# Patient Record
Sex: Male | Born: 1965
Health system: Southern US, Community
[De-identification: ages and names within clinical notes are randomized; demographics above are authoritative.]

## PROBLEM LIST (undated history)

## (undated) DIAGNOSIS — B958 Unspecified staphylococcus as the cause of diseases classified elsewhere: Secondary | ICD-10-CM

## (undated) DIAGNOSIS — J45909 Unspecified asthma, uncomplicated: Secondary | ICD-10-CM

## (undated) DIAGNOSIS — R7303 Prediabetes: Secondary | ICD-10-CM

## (undated) DIAGNOSIS — F909 Attention-deficit hyperactivity disorder, unspecified type: Secondary | ICD-10-CM

## (undated) DIAGNOSIS — E079 Disorder of thyroid, unspecified: Secondary | ICD-10-CM

## (undated) DIAGNOSIS — M1990 Unspecified osteoarthritis, unspecified site: Secondary | ICD-10-CM

## (undated) DIAGNOSIS — K219 Gastro-esophageal reflux disease without esophagitis: Secondary | ICD-10-CM

## (undated) DIAGNOSIS — R7989 Other specified abnormal findings of blood chemistry: Secondary | ICD-10-CM

## (undated) DIAGNOSIS — S62109A Fracture of unspecified carpal bone, unspecified wrist, initial encounter for closed fracture: Secondary | ICD-10-CM

## (undated) DIAGNOSIS — E785 Hyperlipidemia, unspecified: Secondary | ICD-10-CM

## (undated) DIAGNOSIS — R112 Nausea with vomiting, unspecified: Secondary | ICD-10-CM

## (undated) DIAGNOSIS — K37 Unspecified appendicitis: Secondary | ICD-10-CM

## (undated) DIAGNOSIS — G4730 Sleep apnea, unspecified: Secondary | ICD-10-CM

## (undated) DIAGNOSIS — S02401A Maxillary fracture, unspecified, initial encounter for closed fracture: Secondary | ICD-10-CM

## (undated) DIAGNOSIS — Z9889 Other specified postprocedural states: Secondary | ICD-10-CM

## (undated) DIAGNOSIS — T7840XA Allergy, unspecified, initial encounter: Secondary | ICD-10-CM

## (undated) HISTORY — DX: Maxillary fracture, unspecified side, initial encounter for closed fracture: S02.401A

## (undated) HISTORY — DX: Unspecified asthma, uncomplicated: J45.909

## (undated) HISTORY — DX: Allergy, unspecified, initial encounter: T78.40XA

## (undated) HISTORY — PX: APPENDECTOMY: SHX54

## (undated) HISTORY — DX: Sleep apnea, unspecified: G47.30

## (undated) HISTORY — DX: Unspecified osteoarthritis, unspecified site: M19.90

## (undated) HISTORY — DX: Other specified abnormal findings of blood chemistry: R79.89

## (undated) HISTORY — DX: Hyperlipidemia, unspecified: E78.5

## (undated) HISTORY — DX: Gastro-esophageal reflux disease without esophagitis: K21.9

## (undated) HISTORY — DX: Prediabetes: R73.03

## (undated) HISTORY — DX: Unspecified appendicitis: K37

## (undated) HISTORY — DX: Fracture of unspecified carpal bone, unspecified wrist, initial encounter for closed fracture: S62.109A

---
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
## 1996-02-24 DIAGNOSIS — K37 Unspecified appendicitis: Secondary | ICD-10-CM

## 1996-02-24 HISTORY — DX: Unspecified appendicitis: K37

## 2006-04-06 ENCOUNTER — Emergency Department (HOSPITAL_COMMUNITY): Admission: EM | Admit: 2006-04-06 | Discharge: 2006-04-06 | Payer: Self-pay | Admitting: Emergency Medicine

## 2007-02-24 DIAGNOSIS — S02401A Maxillary fracture, unspecified, initial encounter for closed fracture: Secondary | ICD-10-CM

## 2007-02-24 HISTORY — DX: Maxillary fracture, unspecified side, initial encounter for closed fracture: S02.401A

## 2010-10-29 DIAGNOSIS — E039 Hypothyroidism, unspecified: Secondary | ICD-10-CM | POA: Insufficient documentation

## 2010-12-04 ENCOUNTER — Ambulatory Visit (HOSPITAL_COMMUNITY): Payer: Self-pay | Admitting: Psychiatry

## 2010-12-10 DIAGNOSIS — G4733 Obstructive sleep apnea (adult) (pediatric): Secondary | ICD-10-CM | POA: Insufficient documentation

## 2011-02-20 DIAGNOSIS — E785 Hyperlipidemia, unspecified: Secondary | ICD-10-CM | POA: Insufficient documentation

## 2011-02-20 DIAGNOSIS — E559 Vitamin D deficiency, unspecified: Secondary | ICD-10-CM | POA: Insufficient documentation

## 2011-07-02 ENCOUNTER — Encounter (HOSPITAL_COMMUNITY): Payer: Self-pay | Admitting: Psychiatry

## 2011-07-02 ENCOUNTER — Ambulatory Visit (INDEPENDENT_AMBULATORY_CARE_PROVIDER_SITE_OTHER): Payer: BC Managed Care – PPO | Admitting: Psychiatry

## 2011-07-02 VITALS — BP 130/78 | HR 66 | Ht 68.0 in | Wt 220.0 lb

## 2011-07-02 DIAGNOSIS — F909 Attention-deficit hyperactivity disorder, unspecified type: Secondary | ICD-10-CM

## 2011-07-02 DIAGNOSIS — F988 Other specified behavioral and emotional disorders with onset usually occurring in childhood and adolescence: Secondary | ICD-10-CM

## 2011-07-02 MED ORDER — DEXMETHYLPHENIDATE HCL ER 15 MG PO CP24: 15.0000 mg | ORAL_CAPSULE | Freq: Every day | ORAL | Status: DC
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
## 2011-07-03 LAB — DRUGS OF ABUSE SCREEN W/O ALC, ROUTINE URINE
Barbiturate Quant, Ur: NEGATIVE
Marijuana Metabolite: NEGATIVE
Methadone: NEGATIVE
Opiate Screen, Urine: NEGATIVE
Propoxyphene: NEGATIVE

## 2011-07-08 ENCOUNTER — Telehealth (HOSPITAL_COMMUNITY): Payer: Self-pay

## 2011-07-08 NOTE — Telephone Encounter (Signed)
[employer] company for prior authorization for Focalin XR. Approval # [id number]. Called pharmacy and patient to informed the same.

## 2011-07-08 NOTE — Telephone Encounter (Signed)
Needs to talk to you about rx and authorization

## 2011-07-27 ENCOUNTER — Emergency Department (INDEPENDENT_AMBULATORY_CARE_PROVIDER_SITE_OTHER)
Admission: EM | Admit: 2011-07-27 | Discharge: 2011-07-27 | Disposition: A | Discharge status: Home / Self Care | Payer: 59 | Source: Home / Self Care | Attending: Family Medicine | Admitting: Family Medicine

## 2011-07-27 ENCOUNTER — Encounter: Payer: Self-pay | Admitting: Emergency Medicine

## 2011-07-27 DIAGNOSIS — J45990 Exercise induced bronchospasm: Secondary | ICD-10-CM

## 2011-07-27 DIAGNOSIS — J069 Acute upper respiratory infection, unspecified: Secondary | ICD-10-CM

## 2011-07-27 HISTORY — DX: Attention-deficit hyperactivity disorder, unspecified type: F90.9

## 2011-07-27 HISTORY — DX: Disorder of thyroid, unspecified: E07.9

## 2011-07-27 MED ORDER — AMOXICILLIN 875 MG PO TABS: 875.0000 mg | ORAL_TABLET | Freq: Two times a day (BID) | ORAL | Status: AC

## 2011-07-27 MED ORDER — BENZONATATE 200 MG PO CAPS: 200.0000 mg | ORAL_CAPSULE | Freq: Every day | ORAL | Status: AC

## 2011-07-27 MED ORDER — PREDNISONE 20 MG PO TABS: 20.0000 mg | ORAL_TABLET | Freq: Two times a day (BID) | ORAL | Status: DC

## 2011-07-27 NOTE — Discharge Instructions (Signed)
Take Mucinex D (guaifenesin with decongestant) twice daily for congestion.  Increase fluid intake, rest. May use Afrin nasal spray (or generic oxymetazoline) twice daily for about 5 days.  Also recommend using saline nasal spray several times daily and saline nasal irrigation (AYR is a common brand) Stop all antihistamines for now, and other non-prescription cough/cold preparations. Continue albuterol inhaler as needed. Follow-up with family doctor if not improving 7 to 10 days.

## 2011-07-27 NOTE — ED Notes (Signed)
Sinus congestion and hx of sinus infections 3 x each year.

## 2011-07-27 NOTE — ED Provider Notes (Signed)
History     CSN: [id number]  Arrival date & time 07/27/11  1956   None     Chief Complaint  Patient presents with  . Nasal Congestion     HPI Comments: Patient complains of 3 day history of gradually progressive URI symptoms beginning with a mild sore throat (now improved), followed by progressive nasal congestion.  A cough started concurrently. Complains of fatigue but no myalgias.  Cough is now worse at night and generally non-productive during the day.  There has been no pleuritic pain or shortness of breath, but he occasionally wheezes.   He has a past history of exercise induced asthma improved with an albuterol inhaler, and over the past three days he has been using his inhaler several times daily.  He has a past history of pneumonia about two years ago and has had numerous episodes of bronchitis.  He notes that he tends to get wheezing and bronchitis during viral URI's.   The history is provided by the patient.    Past Medical History  Diagnosis Date  . Fracture, maxillary 2009  . Appendicitis 1998  . Fracture of wrist 1984  . ADHD (attention deficit hyperactivity disorder)   . Thyroid disease     Past Surgical History  Procedure Date  . Appendectomy     Family History  Problem Relation Age of Onset  . Heart attack Father   . Emphysema Father   . Heart failure Father   . Alcohol abuse Maternal Aunt   . Alcohol abuse Maternal Uncle   . Diabetes Mother   . Hypertension Mother     History  Substance Use Topics  . Smoking status: Never Smoker   . Smokeless tobacco: Not on file  . Alcohol Use: No      Review of Systems + sore throat + cough No pleuritic pain ? wheezing + nasal congestion + post-nasal drainage No sinus pain/pressure No itchy/red eyes + earache No hemoptysis No SOB No fever, + chills/sweats No nausea No vomiting No abdominal pain No diarrhea No urinary symptoms No skin rashes + fatigue No myalgias + headache Used OTC meds  without relief (Daquil)  Allergies  Review of patient's allergies indicates no known allergies.  Home Medications   Current Outpatient Rx  Name Route Sig Dispense Refill  . AMOXICILLIN 875 MG PO TABS Oral Take 1 tablet (875 mg total) by mouth 2 (two) times daily. 20 tablet 0  . BENZONATATE 200 MG PO CAPS Oral Take 1 capsule (200 mg total) by mouth at bedtime. Take as needed for cough 12 capsule 0  . DEXMETHYLPHENIDATE HCL ER 15 MG PO CP24 Oral Take 1 capsule (15 mg total) by mouth daily. 30 capsule 0  . LEVOTHYROXINE SODIUM 112 MCG PO TABS Oral Take 112 mcg by mouth.    Marland Kitchen PREDNISONE 20 MG PO TABS Oral Take 1 tablet (20 mg total) by mouth 2 (two) times daily. 6 tablet 0  . PROAIR HFA 108 (90 BASE) MCG/ACT IN AERS      . SUMATRIPTAN SUCCINATE 100 MG PO TABS Oral Take 100 mg by mouth.      BP 115/80  Pulse 84  Resp 16  Ht 5\' 8"  (1.727 m)  Wt 215 lb (97.523 kg)  BMI 32.69 kg/m2  SpO2 98%  Physical Exam Nursing notes and Vital Signs reviewed. Appearance:  Patient appears healthy, stated age, and in no acute distress Eyes:  Pupils are equal, round, and reactive to light and accomodation.  Extraocular movement is intact.  Conjunctivae are not inflamed  Ears:  Canals normal.  Tympanic membranes normal.  Nose:  Mildly congested turbinates.  No sinus tenderness.  Pharynx:  Normal Neck:  Supple.   Tender shotty posterior nodes are palpated bilaterally  Lungs:   Faint bibasilar posterior wheezes.  Breath sounds are equal.  Chest:  Mild tenderness to palpation over the mid-sternum.  Heart:  Regular rate and rhythm without murmurs, rubs, or gallops.  Abdomen:  Nontender without masses or hepatosplenomegaly.  Bowel sounds are present.  No CVA or flank tenderness.  Extremities:  No edema.  No calf tenderness Skin:  No rash present.   ED Course  Procedures  none      1. Acute upper respiratory infections of unspecified site   2. Exercise-induced asthma       MDM  Begin  amoxicillin, prednisone burst.  Prescription written for Benzonatate (Tessalon) to take at bedtime for night-time cough.  Take Mucinex D (guaifenesin with decongestant) twice daily for congestion.  Increase fluid intake, rest. May use Afrin nasal spray (or generic oxymetazoline) twice daily for about 5 days.  Also recommend using saline nasal spray several times daily and saline nasal irrigation (AYR is a common brand) Stop all antihistamines for now, and other non-prescription cough/cold preparations. Continue albuterol inhaler as needed. Follow-up with family doctor if not improving 7 to 10 days.         Lattie Haw, MD 07/28/11 [phone]

## 2011-08-03 ENCOUNTER — Encounter (HOSPITAL_COMMUNITY): Payer: Self-pay | Admitting: Psychiatry

## 2011-08-03 ENCOUNTER — Ambulatory Visit (INDEPENDENT_AMBULATORY_CARE_PROVIDER_SITE_OTHER): Payer: BC Managed Care – PPO | Admitting: Psychiatry

## 2011-08-03 VITALS — BP 113/80 | HR 74 | Ht 68.0 in | Wt 213.0 lb

## 2011-08-03 DIAGNOSIS — F909 Attention-deficit hyperactivity disorder, unspecified type: Secondary | ICD-10-CM

## 2011-08-03 DIAGNOSIS — F988 Other specified behavioral and emotional disorders with onset usually occurring in childhood and adolescence: Secondary | ICD-10-CM

## 2011-08-03 MED ORDER — DEXMETHYLPHENIDATE HCL ER 15 MG PO CP24: 15.0000 mg | ORAL_CAPSULE | Freq: Every day | ORAL | Status: DC
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
## 2011-10-05 ENCOUNTER — Encounter (HOSPITAL_COMMUNITY): Payer: Self-pay | Admitting: Psychiatry

## 2011-10-05 ENCOUNTER — Ambulatory Visit (INDEPENDENT_AMBULATORY_CARE_PROVIDER_SITE_OTHER): Payer: Private Health Insurance - Indemnity | Admitting: Psychiatry

## 2011-10-05 VITALS — BP 112/76 | HR 81 | Ht 68.0 in | Wt 207.0 lb

## 2011-10-05 DIAGNOSIS — F988 Other specified behavioral and emotional disorders with onset usually occurring in childhood and adolescence: Secondary | ICD-10-CM

## 2011-10-05 DIAGNOSIS — F909 Attention-deficit hyperactivity disorder, unspecified type: Secondary | ICD-10-CM

## 2011-10-05 MED ORDER — DEXMETHYLPHENIDATE HCL ER 15 MG PO CP24: 15.0000 mg | ORAL_CAPSULE | Freq: Every day | ORAL | Status: DC
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
## 2011-12-07 ENCOUNTER — Encounter (HOSPITAL_COMMUNITY): Payer: Self-pay | Admitting: Psychiatry

## 2011-12-07 ENCOUNTER — Ambulatory Visit (INDEPENDENT_AMBULATORY_CARE_PROVIDER_SITE_OTHER): Payer: Private Health Insurance - Indemnity | Admitting: Psychiatry

## 2011-12-07 VITALS — BP 115/71 | HR 68 | Ht 68.0 in | Wt 209.0 lb

## 2011-12-07 DIAGNOSIS — F988 Other specified behavioral and emotional disorders with onset usually occurring in childhood and adolescence: Secondary | ICD-10-CM

## 2011-12-07 DIAGNOSIS — F909 Attention-deficit hyperactivity disorder, unspecified type: Secondary | ICD-10-CM

## 2011-12-07 MED ORDER — DEXMETHYLPHENIDATE HCL ER 15 MG PO CP24: 15.0000 mg | ORAL_CAPSULE | Freq: Every day | ORAL | Status: DC
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
## 2012-02-09 ENCOUNTER — Ambulatory Visit (HOSPITAL_COMMUNITY): Payer: Self-pay | Admitting: Psychiatry

## 2012-02-11 ENCOUNTER — Telehealth (HOSPITAL_COMMUNITY): Payer: Self-pay | Admitting: Psychiatry

## 2012-02-11 NOTE — Telephone Encounter (Signed)
Patient reports that he has a 30 day supply of Focalin XR, but has not had to use any as his is not currently working.   PLAN:  I advised the patient about the fact that we would close his chart and that he was welcome to come in to be reestablished as a patient when go his insurance back.

## 2015-04-09 LAB — TSH: TSH: 0.6 u[IU]/mL (ref 0.41–5.90)

## 2015-04-09 LAB — HEPATIC FUNCTION PANEL
ALK PHOS: 82 U/L (ref 25–125)
ALT: 48 U/L — AB (ref 10–40)
AST: 38 U/L (ref 14–40)
Bilirubin, Total: 0.9 mg/dL

## 2015-04-09 LAB — TESTOSTERONE
Testosterone, Free: 10.1
Testosterone: 454

## 2015-04-09 LAB — LIPID PANEL
Cholesterol: 195 mg/dL (ref 0–200)
HDL: 57 mg/dL (ref 35–70)
LDL CALC: 117 mg/dL
Triglycerides: 103 mg/dL (ref 40–160)

## 2015-04-09 LAB — BASIC METABOLIC PANEL
Creatinine: 1.3 mg/dL (ref 0.6–1.3)
Potassium: 5 mmol/L (ref 3.4–5.3)
Sodium: 140 mmol/L (ref 137–147)

## 2015-04-09 LAB — CBC AND DIFFERENTIAL
PLATELETS: 218 10*3/uL (ref 150–399)
WBC: 7.2 10*3/mL

## 2015-04-09 LAB — VITAMIN D 25 HYDROXY (VIT D DEFICIENCY, FRACTURES): VIT D 25 HYDROXY: 35.6

## 2015-04-25 LAB — C REACTIVE PROTEIN, FLUID: CRP: 2.57

## 2015-04-25 LAB — VITAMIN B12
COPPER: 104
VITAMIN B12: 475

## 2015-04-25 LAB — THYROXINE (T4) FREE, DIRECT: T4,Free (Direct): 1.58

## 2015-04-25 LAB — HEMOGLOBIN A1C: Hemoglobin A1C: 5.8

## 2015-06-19 ENCOUNTER — Encounter: Payer: Self-pay | Admitting: *Deleted

## 2015-06-19 ENCOUNTER — Emergency Department
Admission: EM | Admit: 2015-06-19 | Discharge: 2015-06-19 | Disposition: A | Discharge status: Home / Self Care | Payer: BLUE CROSS/BLUE SHIELD | Source: Home / Self Care | Attending: Family Medicine | Admitting: Family Medicine

## 2015-06-19 DIAGNOSIS — M7632 Iliotibial band syndrome, left leg: Secondary | ICD-10-CM | POA: Diagnosis not present

## 2015-06-19 DIAGNOSIS — M5432 Sciatica, left side: Secondary | ICD-10-CM

## 2015-06-19 MED ORDER — HYDROCODONE-ACETAMINOPHEN 5-325 MG PO TABS: 1.0000 | ORAL_TABLET | Freq: Four times a day (QID) | ORAL | Status: DC | PRN

## 2015-06-19 MED ORDER — MELOXICAM 7.5 MG PO TABS: 7.5000 mg | ORAL_TABLET | Freq: Every day | ORAL | Status: DC

## 2015-06-19 MED ORDER — PREDNISONE 20 MG PO TABS: ORAL_TABLET | ORAL | Status: DC

## 2015-06-19 NOTE — Discharge Instructions (Signed)
Norco/Vicodin (hydrocodone-acetaminophen) is a narcotic pain medication, do not combine these medications with others containing tylenol. While taking, do not drink alcohol, drive, or perform any other activities that requires focus while taking these medications.   Meloxicam (Mobic) is an antiinflammatory to help with pain and inflammation.  Do not take ibuprofen, Advil, Aleve, or any other medications that contain NSAIDs while taking meloxicam as this may cause stomach upset or even ulcers if taken in large amounts for an extended period of time.   You may try the Vicodin, Mobic, stretches, and alternating ice and heat first. If symptoms not improving, you may try adding the prednisone.

## 2015-06-19 NOTE — ED Provider Notes (Signed)
CSN: [id number]     Arrival date & time 06/19/15  0808 History   First MD Initiated Contact with Patient 06/19/15 [phone]     Chief Complaint  Patient presents with  . Back Pain  . Leg Pain   (Consider location/radiation/quality/duration/timing/severity/associated sxs/prior Treatment) HPI  The pt is a 50yo male [facility] with c/o Left buttock pain that started yesterday while he was sitting at work.  Pain is aching and dull, radiates down his Left thigh.  He notes he is in a weight loss program that involves weightlifting and boxing.  He did boxing the other day but does not recall any known injury and notes it does not involve kickboxing, just punching.  Pain is 4/10, worse with certain movements and positions, especially prolonged sitting.  He took 2 ibuprofen with morning with minimal relief. Reports hx of Left hip bursitis in the past as well. He does not have an orthopedist/sports medicine provider.  Denies numbness or tingling in groin or leg.  Past Medical History  Diagnosis Date  . Fracture, maxillary (HCC) 2009  . Appendicitis 1998  . Fracture of wrist 1984  . ADHD (attention deficit hyperactivity disorder)   . Thyroid disease   . Asthma     Exercise Induced   Past Surgical History  Procedure Laterality Date  . Appendectomy     Family History  Problem Relation Age of Onset  . Heart attack Father   . Emphysema Father   . Heart failure Father   . Alcohol abuse Maternal Aunt   . Alcohol abuse Maternal Uncle   . Diabetes Mother   . Hypertension Mother    Social History  Substance Use Topics  . Smoking status: Never Smoker   . Smokeless tobacco: None  . Alcohol Use: No     Comment: One drink per week.    Review of Systems  Musculoskeletal: Positive for myalgias and arthralgias. Negative for joint swelling, gait problem, neck pain and neck stiffness.       Left hip and thigh  Skin: Negative for color change and wound.  Neurological: Negative for weakness and  numbness.    Allergies  Review of patient's allergies indicates no known allergies.  Home Medications   Prior to Admission medications   Medication Sig Start Date End Date Taking? Authorizing Provider  testosterone enanthate (DELATESTRYL) 200 MG/ML injection Inject into the muscle every 14 (fourteen) days. For IM use only   Yes Historical Provider, MD  Thyroid (NATURE-THROID) 113.75 MG TABS Take 112 mcg by mouth.   Yes Historical Provider, MD  HYDROcodone-acetaminophen (NORCO/VICODIN) 5-325 MG tablet Take 1-2 tablets by mouth every 6 (six) hours as needed for moderate pain or severe pain. 06/19/15   Junius FinnerErin O'Malley, PA-C  meloxicam (MOBIC) 7.5 MG tablet Take 1-2 tablets (7.5-15 mg total) by mouth daily. For 5 days, then daily as needed for pain. 06/19/15   Junius FinnerErin O'Malley, PA-C  predniSONE (DELTASONE) 20 MG tablet 3 tabs po day one, then 2 po daily x 4 days 06/19/15   Junius FinnerErin O'Malley, PA-C   Meds Ordered and Administered this Visit  Medications - No data to display  BP 108/72 mmHg  Pulse 66  Temp(Src) 98 F (36.7 C) (Oral)  Wt 199 lb (90.266 kg)  SpO2 96% No data found.   Physical Exam  Constitutional: He is oriented to person, place, and time. He appears well-developed and well-nourished.  HENT:  Head: Normocephalic and atraumatic.  Eyes: EOM are normal.  Neck: Normal  range of motion.  Cardiovascular: Normal rate.   Pulmonary/Chest: Effort normal.  Musculoskeletal: Normal range of motion. He exhibits tenderness. He exhibits no edema.  Mild tenderness to SI joint, no other midline spinal tenderness. Tenderness to Left buttock and along IT band on Left thigh.  Negative straight leg raise. Normal gait.  Muscle compartments of calf and thigh are soft. Full ROM Left hip and knee.  Neurological: He is alert and oriented to person, place, and time.  Normal sensation Left leg.  Skin: Skin is warm and dry. No rash noted. No erythema.  Psychiatric: He has a normal mood and affect. His  behavior is normal.  Nursing note and vitals reviewed.   ED Course  Procedures (including critical care time)  Labs Review Labs Reviewed - No data to display  Imaging Review No results found.    MDM   1. IT band syndrome, left   2. Left sided sciatica     Pt c/o Left buttock pain radiating down Left thigh.  Exam c/w IT band syndrome with underlying sciatica, likely secondary to overuse injury from workout program.    Pt would like to continue to workout if possible as he has already lost 28 pounds in 6 weeks. Advised pt he can continue to workout but encouraged gentle stretching and exercises, light cardio such as walking or jogging.  Limit weight lifting for lower extremities.  Rx: Meloxicam and Norco.  Prednisone to hold as he has had it in the past for asthma, he doesn't like how he is more hungry while taking but will take later if needed.  F/u with Sports Medicine, Dr. Denyse Amass or Dr. [name], in 1-2 weeks for recheck of symptoms if not improving. Patient verbalized understanding and agreement with treatment plan.    Junius Finner, PA-C 06/19/15 [phone]

## 2015-06-19 NOTE — ED Notes (Signed)
While sitting @ work yesterday pt developed pain in left mid buttocks that is a constant dull aches with sharp shooting pains down his leg. He is in an exercise program. Took IBF this AM.

## 2015-06-26 ENCOUNTER — Ambulatory Visit: Payer: Self-pay | Admitting: Family Medicine

## 2015-07-16 ENCOUNTER — Ambulatory Visit: Payer: Self-pay | Admitting: Family Medicine

## 2015-07-23 DIAGNOSIS — E559 Vitamin D deficiency, unspecified: Secondary | ICD-10-CM | POA: Diagnosis not present

## 2015-07-23 DIAGNOSIS — E039 Hypothyroidism, unspecified: Secondary | ICD-10-CM | POA: Diagnosis not present

## 2015-07-23 DIAGNOSIS — R7303 Prediabetes: Secondary | ICD-10-CM | POA: Diagnosis not present

## 2015-07-23 DIAGNOSIS — E291 Testicular hypofunction: Secondary | ICD-10-CM | POA: Diagnosis not present

## 2015-07-23 DIAGNOSIS — R5383 Other fatigue: Secondary | ICD-10-CM | POA: Diagnosis not present

## 2015-07-23 LAB — T3, FREE: Triiodothyronine, Free, Serum: 4.4

## 2015-07-23 LAB — TESTOSTERONE: TESTOSTERONE: 714

## 2015-07-23 LAB — VITAMIN D 25 HYDROXY (VIT D DEFICIENCY, FRACTURES): Vitamin D, 25-Hydroxy: 53.3

## 2015-07-23 LAB — TSH: TSH: 0.43 u[IU]/mL (ref <=5.90)

## 2015-07-23 LAB — THYROXINE (T4) FREE, DIRECT: THYROXINE (T4): 0.74

## 2015-07-23 LAB — PSA: PROSTATE SPECIFIC AG, SERUM: 0.6

## 2015-08-06 ENCOUNTER — Encounter: Payer: Self-pay | Admitting: Family Medicine

## 2015-08-06 ENCOUNTER — Ambulatory Visit (INDEPENDENT_AMBULATORY_CARE_PROVIDER_SITE_OTHER): Payer: BLUE CROSS/BLUE SHIELD | Admitting: Family Medicine

## 2015-08-06 VITALS — BP 122/77 | HR 65 | Ht 68.0 in | Wt 202.0 lb

## 2015-08-06 DIAGNOSIS — M7062 Trochanteric bursitis, left hip: Secondary | ICD-10-CM

## 2015-08-06 DIAGNOSIS — J45990 Exercise induced bronchospasm: Secondary | ICD-10-CM

## 2015-08-06 DIAGNOSIS — E559 Vitamin D deficiency, unspecified: Secondary | ICD-10-CM | POA: Diagnosis not present

## 2015-08-06 DIAGNOSIS — E785 Hyperlipidemia, unspecified: Secondary | ICD-10-CM

## 2015-08-06 DIAGNOSIS — G43909 Migraine, unspecified, not intractable, without status migrainosus: Secondary | ICD-10-CM | POA: Insufficient documentation

## 2015-08-06 MED ORDER — ALBUTEROL SULFATE HFA 108 (90 BASE) MCG/ACT IN AERS: 2.0000 | INHALATION_SPRAY | Freq: Four times a day (QID) | RESPIRATORY_TRACT | Status: DC | PRN

## 2015-08-06 NOTE — Progress Notes (Signed)
Scott ArtisMark [name] a 50 y.o. male who presents to [facility] today for establish care and discuss left lateral hip pain.  Hip pain: Patient notes a one-month history of left lateral hip pain. He notes pain is worse following activity and when he lies on his left side at night. He denies any radiating pain weakness or numbness. He notes several months ago he strained his piriformis on the same side. He's very active and works out daily in place tennis competitively. No bowel bladder dysfunction fevers or chills. No acute injury.  Additionally notes chronic problems including hypothyroidism currently managed by [facility] and mild exercise induced asthma. Additionally he has mild hypogonadism also managed by integrative medicine.   Past Medical History  Diagnosis Date  . Fracture, maxillary (HCC) 2009  . Appendicitis 1998  . Fracture of wrist 1984  . ADHD (attention deficit hyperactivity disorder)   . Thyroid disease   . Asthma     Exercise Induced   Past Surgical History  Procedure Laterality Date  . Appendectomy     Social History  Substance Use Topics  . Smoking status: Never Smoker   . Smokeless tobacco: Not on file  . Alcohol Use: No     Comment: One drink per week.   family history includes Alcohol abuse in his maternal aunt and maternal uncle; Diabetes in his mother; Emphysema in his father; Heart attack in his father; Heart failure in his father; Hypertension in his mother.  ROS:  No headache, visual changes, nausea, vomiting, diarrhea, constipation, dizziness, abdominal pain, skin rash, fevers, chills, night sweats, weight loss, swollen lymph nodes, body aches, joint swelling, muscle aches, chest pain, shortness of breath, mood changes, visual or auditory hallucinations.    Medications: Current Outpatient Prescriptions  Medication Sig Dispense Refill  . diazepam (VALIUM) 10 MG tablet TAKE ONE TABLET (10 MG  TOTAL) BY MOUTH AT BEDTIME AS NEEDED FOR ANXIETY.  1  . levothyroxine (SYNTHROID, LEVOTHROID) 112 MCG tablet Take by mouth.    . testosterone enanthate (DELATESTRYL) 200 MG/ML injection Inject into the muscle every 14 (fourteen) days. For IM use only    . Thyroid (NATURE-THROID) 113.75 MG TABS Take 112 mcg by mouth.    Marland Kitchen. albuterol (PROVENTIL HFA;VENTOLIN HFA) 108 (90 Base) MCG/ACT inhaler Inhale 2 puffs into the lungs every 6 (six) hours as needed for wheezing or shortness of breath. 1 Inhaler 1   No current facility-administered medications for this visit.   Allergies  Allergen Reactions  . Naproxen Nausea And Vomiting     Exam:  BP 122/77 mmHg  Pulse 65  Ht 5\' 8"  (1.727 m)  Wt 202 lb (91.627 kg)  BMI 30.72 kg/m2 General: Well Developed, well nourished, and in no acute distress.  Neuro/Psych: Alert and oriented x3, extra-ocular muscles intact, able to move all 4 extremities, sensation grossly intact. Skin: Warm and dry, no rashes noted.  Respiratory: Not using accessory muscles, speaking in full sentences, trachea midline.  Cardiovascular: Pulses palpable, no extremity edema. Abdomen: Does not appear distended. MSK: Nontender to spinal midline. Hip normal motion bilaterally. Tender to palpation left greater trochanter area Hip abduction strength 4/5 left 5/5 right Equal leg lengths Normal gait. Normal back motion   No results found for this or any previous visit (from the past 24 hour(s)). No results found.   50 year old male with 1) left greater trochanter bursitis. Treat with home exercise program physical therapy and recheck in about a month.  2) hypothyroid: Managed with integrative medicine. Obtain records. 3) hyperlipidemia: Obtain records 4) testosterone deficiency: Obtain records managed with integrative medicine

## 2015-08-06 NOTE — Patient Instructions (Signed)
Thank you for coming in today. Attend PT. You should hear from digestive health about colon cancer screening.  Return in 1 month.   Hip Bursitis Bursitis is a swelling and soreness (inflammation) of a fluid-filled sac (bursa). This sac overlies and protects the joints.  CAUSES   Injury.  Overuse of the muscles surrounding the joint.  Arthritis.  Gout.  Infection.  Cold weather.  Inadequate warm-up and conditioning prior to activities. The cause may not be known.  SYMPTOMS   Mild to severe irritation.  Tenderness and swelling over the outside of the hip.  Pain with motion of the hip.  If the bursa becomes infected, a fever may be present. Redness, tenderness, and warmth will develop over the hip. Symptoms usually lessen in 3 to 4 weeks with treatment, but can come back. TREATMENT If conservative treatment does not work, your caregiver may advise draining the bursa and injecting cortisone into the area. This may speed up the healing process. This may also be used as an initial treatment of choice. HOME CARE INSTRUCTIONS   Apply ice to the affected area for 15-20 minutes every 3 to 4 hours while awake for the first 2 days. Put the ice in a plastic bag and place a towel between the bag of ice and your skin.  Rest the painful joint as much as possible, but continue to put the joint through a normal range of motion at least 4 times per day. When the pain lessens, begin normal, slow movements and usual activities to help prevent stiffness of the hip.  Only take over-the-counter or prescription medicines for pain, discomfort, or fever as directed by your caregiver.  Use crutches to limit weight bearing on the hip joint, if advised.  Elevate your painful hip to reduce swelling. Use pillows for propping and cushioning your legs and hips.  Gentle massage may provide comfort and decrease swelling. SEEK IMMEDIATE MEDICAL CARE IF:   Your pain increases even during treatment, or  you are not improving.  You have a fever.  You have heat and inflammation over the involved bursa.  You have any other questions or concerns. MAKE SURE YOU:   Understand these instructions.  Will watch your condition.  Will get help right away if you are not doing well or get worse.   This information is not intended to replace advice given to you by your health care provider. Make sure you discuss any questions you have with your health care provider.   Document Released: 08/01/2001 Document Revised: 05/04/2011 Document Reviewed: 09/11/2014 Elsevier Interactive Patient Education [employer]2016 Elsevier Inc.

## 2015-08-15 ENCOUNTER — Encounter: Payer: Self-pay | Admitting: Family Medicine

## 2015-08-15 DIAGNOSIS — E7212 Methylenetetrahydrofolate reductase deficiency: Secondary | ICD-10-CM | POA: Diagnosis not present

## 2015-08-15 DIAGNOSIS — E039 Hypothyroidism, unspecified: Secondary | ICD-10-CM | POA: Diagnosis not present

## 2015-08-15 DIAGNOSIS — R7303 Prediabetes: Secondary | ICD-10-CM | POA: Diagnosis not present

## 2015-08-15 DIAGNOSIS — E291 Testicular hypofunction: Secondary | ICD-10-CM | POA: Diagnosis not present

## 2015-08-15 LAB — TSH: TSH: 0.6 u[IU]/mL (ref 0.41–5.90)

## 2015-08-29 ENCOUNTER — Other Ambulatory Visit: Payer: Self-pay | Admitting: Family Medicine

## 2015-08-29 DIAGNOSIS — M7062 Trochanteric bursitis, left hip: Secondary | ICD-10-CM

## 2015-08-29 NOTE — Progress Notes (Signed)
Pt called requesting that for his convenience a new PT referral be ordered for   [facility] [address], [place], [id number] T: [phone]   [phone]  Referral ordered.

## 2015-09-03 ENCOUNTER — Ambulatory Visit (INDEPENDENT_AMBULATORY_CARE_PROVIDER_SITE_OTHER): Payer: BLUE CROSS/BLUE SHIELD

## 2015-09-03 ENCOUNTER — Ambulatory Visit (INDEPENDENT_AMBULATORY_CARE_PROVIDER_SITE_OTHER): Payer: BLUE CROSS/BLUE SHIELD | Admitting: Family Medicine

## 2015-09-03 ENCOUNTER — Encounter: Payer: Self-pay | Admitting: Family Medicine

## 2015-09-03 VITALS — BP 130/80 | HR 64 | Wt 207.0 lb

## 2015-09-03 DIAGNOSIS — M25531 Pain in right wrist: Secondary | ICD-10-CM | POA: Diagnosis not present

## 2015-09-03 NOTE — Patient Instructions (Signed)
Thank you for coming in today. Return in a month or sooner if not better.   Wrist Sprain A wrist sprain is a stretch or tear in the strong, fibrous tissues (ligaments) that connect your wrist bones. The ligaments of your wrist may be easily sprained. There are three types of wrist sprains.  Grade 1. The ligament is not stretched or torn, but the sprain causes pain.  Grade 2. The ligament is stretched or partially torn. You may be able to move your wrist, but not very much.  Grade 3. The ligament or muscle completely tears. You may find it difficult or extremely painful to move your wrist even a little. CAUSES Often, wrist sprains are a result of a fall or an injury. The force of the impact causes the fibers of your ligament to stretch too much or tear. Common causes of wrist sprains include:  Overextending your wrist while catching a ball with your hands.  Repetitive or strenuous extension or bending of your wrist.  Landing on your hand during a fall. RISK FACTORS  Having previous wrist injuries.  Playing contact sports, such as boxing or wrestling.  Participating in activities in which falling is common.  Having poor wrist strength and flexibility. SIGNS AND SYMPTOMS  Wrist pain.  Wrist tenderness.  Inflammation or bruising of the wrist area.  Hearing a "pop" or feeling a tear at the time of the injury.  Decreased wrist movement due to pain, stiffness, or weakness. DIAGNOSIS Your health care provider will examine your wrist. In some cases, an X-ray will be taken to make sure you did not break any bones. If your health care provider thinks that you tore a ligament, he or she may order an MRI of your wrist. TREATMENT Treatment involves resting and icing your wrist. You may also need to take pain medicines to help lessen pain and inflammation. Your health care provider may recommend keeping your wrist still (immobilized) with a splint to help your sprain heal. When the splint  is no longer necessary, you may need to perform strengthening and stretching exercises. These exercises help you to regain strength and full range of motion in your wrist. Surgery is not usually needed for wrist sprains unless the ligament completely tears. HOME CARE INSTRUCTIONS  Rest your wrist. Do not do things that cause pain.  Wear your wrist splint as directed by your health care provider.  Take medicines only as directed by your health care provider.  To ease pain and swelling, apply ice to the injured area.  Put ice in a plastic bag.  Place a towel between your skin and the bag.  Leave the ice on for 20 minutes, 2-3 times a day. SEEK MEDICAL CARE IF:  Your pain, discomfort, or swelling gets worse even with treatment.  You feel sudden numbness in your hand.   This information is not intended to replace advice given to you by your health care provider. Make sure you discuss any questions you have with your health care provider.   Document Released: 10/13/2013 Document Reviewed: 10/13/2013 Elsevier Interactive Patient Education [employer]2016 Elsevier Inc.

## 2015-09-04 NOTE — Progress Notes (Signed)
       Scott ArtisMark [name] a 50 y.o. male who [name] today for right wrist injury.   Scott Fields notes a two week history of right wrist pain. His pain started when he was doing boxing practice when he "tweaked" his right wrist. He notes that the pain has been intermittent and mild.Marland Kitchen. He currently is pain free. He denies any radiating pain, weakness or numbness. No locking or clicking. This pain is typically located in the mid dorsal wrist.    Past Medical History  Diagnosis Date  . Fracture, maxillary (HCC) 2009  . Appendicitis 1998  . Fracture of wrist 1984  . ADHD (attention deficit hyperactivity disorder)   . Thyroid disease   . Asthma     Exercise Induced   Past Surgical History  Procedure Laterality Date  . Appendectomy     Social History  Substance Use Topics  . Smoking status: Never Smoker   . Smokeless tobacco: Not on file  . Alcohol Use: No     Comment: One drink per week.   family history includes Alcohol abuse in his maternal aunt and maternal uncle; Diabetes in his mother; Emphysema in his father; Heart attack in his father; Heart failure in his father; Hypertension in his mother.  ROS as above:  Medications: Current Outpatient Prescriptions  Medication Sig Dispense Refill  . albuterol (PROVENTIL HFA;VENTOLIN HFA) 108 (90 Base) MCG/ACT inhaler Inhale 2 puffs into the lungs every 6 (six) hours as needed for wheezing or shortness of breath. 1 Inhaler 1  . diazepam (VALIUM) 10 MG tablet TAKE ONE TABLET (10 MG TOTAL) BY MOUTH AT BEDTIME AS NEEDED FOR ANXIETY.  1  . testosterone enanthate (DELATESTRYL) 200 MG/ML injection Inject into the muscle every 14 (fourteen) days. For IM use only    . Thyroid (NATURE-THROID) 113.75 MG TABS Take 112 mcg by mouth.     No current facility-administered medications for this visit.   Allergies  Allergen  Reactions  . Naproxen Nausea And Vomiting     Exam:  BP 130/80 mmHg  Pulse 64  Wt 207 lb (93.895 kg) Gen: Well NAD Right Wrist: Normal appearing.  Non-tender.  Normal motion.  Pulses cap refill and sensation intact.   No results found for this or any previous visit (from the past 24 hour(s)). Dg Wrist Complete Right  09/03/2015  CLINICAL DATA:  Patient c/o posterior right wrist pains over carpals, states that he was boxing last week and didn't wrap his wrists beforehand and punched with his right wrist and it has been hurting since, no other complaints EXAM: RIGHT WRIST - COMPLETE 3+ VIEW COMPARISON:  None. FINDINGS: No acute fracture.  No dislocation. There is an old ununited fracture of the ulnar styloid. An old fracture of the distal radius is reflected by mild deformity and mild dorsal angulation of the distal radial articular surface. Joints are normally spaced and aligned. Soft tissues are unremarkable. IMPRESSION: No acute fracture or dislocation. Electronically Signed   By: Amie Portlandavid  Ormond M.D.   On: 09/03/2015 16:16      Assessment and Plan: 50 y.o. male with Wrist injury. Likely sprain. Fracture or full wrist ligament tear very unlikely.  Plan for rest and gradual return to loading activity like boxing or tennis.  Recheck in 4 week if not better.   Discussed warning signs or symptoms. Please see discharge instructions. Patient expresses understanding.

## 2015-09-04 NOTE — Progress Notes (Signed)
Quick Note:  Wrist xray looks pretty normal. Some mild arthritis is present and that old healed fracture is seen. ______

## 2015-09-05 DIAGNOSIS — M7062 Trochanteric bursitis, left hip: Secondary | ICD-10-CM | POA: Diagnosis not present

## 2015-09-05 DIAGNOSIS — J45990 Exercise induced bronchospasm: Secondary | ICD-10-CM | POA: Diagnosis not present

## 2015-09-06 ENCOUNTER — Ambulatory Visit: Payer: Self-pay | Admitting: Family Medicine

## 2015-09-10 ENCOUNTER — Ambulatory Visit (INDEPENDENT_AMBULATORY_CARE_PROVIDER_SITE_OTHER): Payer: BLUE CROSS/BLUE SHIELD | Admitting: Family Medicine

## 2015-09-10 DIAGNOSIS — Z5329 Procedure and treatment not carried out because of patient's decision for other reasons: Secondary | ICD-10-CM

## 2015-09-10 NOTE — Progress Notes (Signed)
No show.  Return if not better

## 2015-09-11 DIAGNOSIS — M7062 Trochanteric bursitis, left hip: Secondary | ICD-10-CM | POA: Diagnosis not present

## 2015-09-11 DIAGNOSIS — J45990 Exercise induced bronchospasm: Secondary | ICD-10-CM | POA: Diagnosis not present

## 2015-09-18 DIAGNOSIS — J45990 Exercise induced bronchospasm: Secondary | ICD-10-CM | POA: Diagnosis not present

## 2015-09-18 DIAGNOSIS — M7062 Trochanteric bursitis, left hip: Secondary | ICD-10-CM | POA: Diagnosis not present

## 2015-10-10 ENCOUNTER — Encounter: Payer: Self-pay | Admitting: Family Medicine

## 2015-10-17 ENCOUNTER — Other Ambulatory Visit: Payer: Self-pay | Admitting: Family Medicine

## 2015-10-17 ENCOUNTER — Encounter: Payer: Self-pay | Admitting: Family Medicine

## 2015-10-17 DIAGNOSIS — J45990 Exercise induced bronchospasm: Secondary | ICD-10-CM

## 2015-11-15 DIAGNOSIS — E291 Testicular hypofunction: Secondary | ICD-10-CM | POA: Diagnosis not present

## 2015-11-15 DIAGNOSIS — R7303 Prediabetes: Secondary | ICD-10-CM | POA: Diagnosis not present

## 2015-11-15 DIAGNOSIS — E039 Hypothyroidism, unspecified: Secondary | ICD-10-CM | POA: Diagnosis not present

## 2015-11-15 DIAGNOSIS — R5383 Other fatigue: Secondary | ICD-10-CM | POA: Diagnosis not present

## 2015-11-15 DIAGNOSIS — E559 Vitamin D deficiency, unspecified: Secondary | ICD-10-CM | POA: Diagnosis not present

## 2015-11-15 LAB — TESTOSTERONE: TESTOSTERONE: 1007

## 2015-11-15 LAB — TSH: TSH: 0.142

## 2015-11-29 DIAGNOSIS — E291 Testicular hypofunction: Secondary | ICD-10-CM | POA: Diagnosis not present

## 2015-11-29 DIAGNOSIS — F329 Major depressive disorder, single episode, unspecified: Secondary | ICD-10-CM | POA: Diagnosis not present

## 2015-11-29 DIAGNOSIS — R7303 Prediabetes: Secondary | ICD-10-CM | POA: Diagnosis not present

## 2015-11-29 DIAGNOSIS — E039 Hypothyroidism, unspecified: Secondary | ICD-10-CM | POA: Diagnosis not present

## 2015-12-16 ENCOUNTER — Encounter: Payer: Self-pay | Admitting: Family Medicine

## 2015-12-16 ENCOUNTER — Ambulatory Visit (INDEPENDENT_AMBULATORY_CARE_PROVIDER_SITE_OTHER): Payer: BLUE CROSS/BLUE SHIELD | Admitting: Family Medicine

## 2015-12-16 VITALS — BP 125/66 | HR 61 | Wt 198.0 lb

## 2015-12-16 DIAGNOSIS — Z1159 Encounter for screening for other viral diseases: Secondary | ICD-10-CM

## 2015-12-16 DIAGNOSIS — L219 Seborrheic dermatitis, unspecified: Secondary | ICD-10-CM | POA: Diagnosis not present

## 2015-12-16 DIAGNOSIS — L989 Disorder of the skin and subcutaneous tissue, unspecified: Secondary | ICD-10-CM

## 2015-12-16 DIAGNOSIS — Z1211 Encounter for screening for malignant neoplasm of colon: Secondary | ICD-10-CM

## 2015-12-16 DIAGNOSIS — L570 Actinic keratosis: Secondary | ICD-10-CM | POA: Diagnosis not present

## 2015-12-16 LAB — HEPATITIS C ANTIBODY: HCV AB: NEGATIVE

## 2015-12-16 MED ORDER — KETOCONAZOLE 2 % EX CREA: 1.0000 "application " | TOPICAL_CREAM | Freq: Two times a day (BID) | CUTANEOUS | 3 refills | Status: DC

## 2015-12-16 NOTE — Progress Notes (Signed)
Scott ArtisMark [name] a 50 y.o. male who [name] today for skin lesion, seborrheic dermatitis, screening for hepatitis C, screen for colon cancer.  Skin lesion: Patient notes a scaly erythematous lesion on his right upper arm that's been present for about 2 months. He denies any fevers or chills nausea vomiting or diarrhea. He's been advised that this should be evaluated by his primary care physician. No unexplained weight loss or night sweats.  Seborrheic dermatitis: She notes a scaly lesion in the right nasal labial fold and the bridge of the nose. This waxes and wanes and has been present for years. He is using over-the-counter creams intermittently which have helped.  Hepatitis C screening: Patient would like screening for hepatitis C if possible.  Colon cancer screening: After discussion patient elects for Cologuard.   Past Medical History:  Diagnosis Date  . ADHD (attention deficit hyperactivity disorder)   . Appendicitis 1998  . Asthma    Exercise Induced  . Fracture of wrist 1984  . Fracture, maxillary (HCC) 2009  . Thyroid disease    Past Surgical History:  Procedure Laterality Date  . APPENDECTOMY     Social History  Substance Use Topics  . Smoking status: Never Smoker  . Smokeless tobacco: Not on file  . Alcohol use No     Comment: One drink per week.   family history includes Alcohol abuse in his maternal aunt and maternal uncle; Diabetes in his mother; Emphysema in his father; Heart attack in his father; Heart failure in his father; Hypertension in his mother.  ROS as above:  Medications: Current Outpatient Prescriptions  Medication Sig Dispense Refill  . diazepam (VALIUM) 10 MG tablet TAKE ONE TABLET (10 MG TOTAL) BY MOUTH AT BEDTIME AS NEEDED FOR ANXIETY.  1  . ketoconazole (NIZORAL) 2 % cream Apply 1 application topically 2  (two) times daily. To affected areas. 60 g 3  . PROAIR HFA 108 (90 Base) MCG/ACT inhaler INHALE 2 PUFFS INTO THE LUNGS EVERY 6 (SIX) HOURS AS NEEDED FOR WHEEZING OR SHORTNESS OF BREATH. 8.5 Inhaler 1  . testosterone enanthate (DELATESTRYL) 200 MG/ML injection Inject into the muscle every 14 (fourteen) days. For IM use only    . Thyroid (NATURE-THROID) 113.75 MG TABS Take 112 mcg by mouth.     No current facility-administered medications for this visit.    Allergies  Allergen Reactions  . Naproxen Nausea And Vomiting    Health Maintenance Health Maintenance  Topic Date Due  . Hepatitis C Screening  08-Apr-1965  . HIV Screening  11/19/1978  . COLONOSCOPY  11/18/2013  . INFLUENZA VACCINE  09/24/2015  . TETANUS/TDAP  04/08/2025     Exam:  BP 125/66   Pulse 61   Wt 198 lb (89.8 kg)   BMI 30.11 kg/m  Gen: Well NAD HEENT: EOMI,  MMM Lungs: Normal work of breathing. CTABL Heart: RRR no MRG Abd: NABS, Soft. Nondistended, Nontender Exts: Brisk capillary refill, warm and well perfused.  Skin: Small erythematous 1 cm scaly lesion right upper arm. Scaly erythematous macular lesion right nasolabial fold.  Shave biopsy: Consent obtained and timeout performed. Skin cleaned with alcohol and cold spray applied and 1 mL of lidocaine injected achieving good anesthesia. Skin again cleaned with alcohol and shave biopsy obtained. Dressing applied. Patient tolerated the procedure well.   No results found for this or any previous visit (from the past 72 hour(s)). No results found.  Assessment and Plan: 50 y.o. male with   Skin lesion on right upper arm is either a small squamous or basal cell carcinoma or actinic keratosis. Shave biopsy obtained and sent for dermatopathology.  Skin lesion on face is seborrheic dermatitis. Plan to treat with ketoconazole cream.  We'll obtain hepatitis C test.  Cologuard ordered.  Obtain records from other doctors offices   Orders Placed This  Encounter  Procedures  . Hepatitis C antibody    Discussed warning signs or symptoms. Please see discharge instructions. Patient expresses understanding.

## 2015-12-16 NOTE — Patient Instructions (Signed)
Thank you for coming in today. Try to get records from your other doctor.  Return as needed.  Use the cream on the rash on the face.   Seborrheic Dermatitis Seborrheic dermatitis involves pink or red skin with greasy, flaky scales. It usually occurs on the scalp, and it is often called dandruff. This condition may also affect the eyebrows, nose, ears, chest, and the bearded area of men's faces. It often occurs where skin has more oil (sebaceous) glands. It may come and go for no known reason, and it is often long-lasting (chronic). CAUSES The cause is not known. RISK FACTORS This condition is more like to develop in:  People who are stressed or tired.  People who have skin conditions, such as acne.  People who have certain conditions, such as:  HIV (human immunodeficiency virus).  AIDS (acquired immunodeficiency syndrome).  Parkinson disease.  An eating disorder.  Stroke.  Depression.  Epilepsy.  Alcoholism.  People who live in places that have extreme weather.  People who have a family history of seborrheic dermatitis.  People who use skin creams that are made with alcohol.  People who are 330-687 years old.  People who take certain medicines. SYMPTOMS Symptoms of this condition include:  Thick scales on the scalp.  Redness on the face or in the armpits.  Skin that is flaky. The flakes may be white or yellow.  Skin that seems oily or dry but is not helped with moisturizers.  Itching or burning in the affected areas. DIAGNOSIS This condition is diagnosed with a medical history and physical exam. A sample of your skin may be tested (skin biopsy). You may need to see a skin specialist (dermatologist). TREATMENT There is no cure for this condition, but treatment can help to manage the symptoms. Treatment may include:  Cortisone (steroid) ointments, creams, and lotions.  Over-the-counter or prescription shampoos. HOME CARE INSTRUCTIONS  Apply  over-the-counter and prescription medicines only as told by your health care provider.  Keep all follow-up visits as told by your health care provider. This is important.  Try to reduce your stress, such as with yoga or mediation. If you need help to reduce stress, ask your health care provider.  Shower or bathe as told by your health care provider.  Use any medicated shampoos as told by your health care provider. SEEK MEDICAL CARE IF:  Your symptoms do not improve with treatment.  Your symptoms get worse.  You have new symptoms.   This information is not intended to replace advice given to you by your health care provider. Make sure you discuss any questions you have with your health care provider.   Document Released: 02/09/2005 Document Revised: 10/31/2014 Document Reviewed: 06/27/2014 Elsevier Interactive Patient Education 2016 Elsevier Inc.    Actinic Keratosis Actinic keratosis is a precancerous growth on the skin. This means it could develop into skin cancer if it is not treated. About 1% of actinic keratoses turn into skin cancer within a year. It is important to have all such growths removed to prevent them from developing into skin cancer. CAUSES  Actinic keratosis is caused by getting too much ultraviolet (UV) radiation from the sun or other UV light sources. RISK FACTORS Factors that increase your chances of getting actinic keratosis include:  Having light-colored skin and blue eyes.  Having blonde or red hair.  Spending a lot of time in the sun.  Age. The risk of actinic keratosis increases with age. SYMPTOMS  Actinic keratosis growths look  like scaly, rough spots of skin. They can be as small as a pinhead or as big as a quarter. They may itch, hurt, or feel sensitive. Sometimes there is a little tag of pink or gray skin growing off them. In some cases, actinic keratoses are easier felt than seen. They do not go away with the use of moisturizing lotions or creams.  Actinic keratoses appear most often on areas of skin that get a lot of sun exposure. These areas include the:  Scalp.  Face.  Ears.  Lips.  Upper back.  Backs of the hands.  Forearms. DIAGNOSIS  Your health care provider can usually tell what is wrong by performing a physical exam. A tissue sample (biopsy) may also be taken and examined under a microscope. TREATMENT  Actinic keratosis can be treated several ways. Most treatments can be done in your health care provider's office. Treatment options may include:  Curettage. A tool is used to gently scrape off the growth.  Cryosurgery. Liquid nitrogen is applied to the growth to freeze it. The growth eventually falls off the skin.  Medicated creams, such as 5-fluorouracil or imiquimod. The medicine destroys the cells in the growth.  Chemical peels. Chemicals are applied to the growth and the outer layers of skin are peeled off.  Photodynamic therapy. A drug that makes your skin more sensitive to light is applied to the skin. A strong, blue light is aimed at the skin and destroys the growth. PREVENTION  To prevent future sun damage:  Try to avoid the sun between 10:00 a.m. and 4:00 p.m. when it is the strongest.  Use a sunscreen or sunblock with SPF 30 or greater.  Apply sunscreen at least 30 minutes before exposure to the sun.  Always wear protective hats, clothing, and sunglasses with UV protection.  Avoid medicines, herbs, and foods that increase your sensitivity to sunlight.  Avoid tanning beds. HOME CARE INSTRUCTIONS   If your skin was covered with a bandage, change and remove the bandage as directed by your health care provider.  Keep the treated area dry as directed by your health care provider.  Apply any creams as prescribed by your health care provider. Follow the directions carefully.  Check your skin regularly for any changes.  Visit a skin doctor (dermatologist) every year for a skin exam. SEEK MEDICAL  CARE IF:   Your skin does not heal and becomes irritated, red, or bleeds.  You notice any changes or new growths on your skin.   This information is not intended to replace advice given to you by your health care provider. Make sure you discuss any questions you have with your health care provider.   Document Released: 05/08/2008 Document Revised: 03/02/2014 Document Reviewed: 03/23/2011 Elsevier Interactive Patient Education [employer]

## 2015-12-24 DIAGNOSIS — F900 Attention-deficit hyperactivity disorder, predominantly inattentive type: Secondary | ICD-10-CM | POA: Diagnosis not present

## 2015-12-24 DIAGNOSIS — F325 Major depressive disorder, single episode, in full remission: Secondary | ICD-10-CM | POA: Diagnosis not present

## 2015-12-26 ENCOUNTER — Encounter: Payer: Self-pay | Admitting: Family Medicine

## 2015-12-26 LAB — TESTOSTERONE, FREE: TESTOSTERONE: 371

## 2016-01-03 ENCOUNTER — Emergency Department
Admission: EM | Admit: 2016-01-03 | Discharge: 2016-01-03 | Disposition: A | Discharge status: Home / Self Care | Payer: BLUE CROSS/BLUE SHIELD | Source: Home / Self Care | Attending: Family Medicine | Admitting: Family Medicine

## 2016-01-03 DIAGNOSIS — K645 Perianal venous thrombosis: Secondary | ICD-10-CM | POA: Diagnosis not present

## 2016-01-03 MED ORDER — HYDROCORTISONE ACETATE 25 MG RE SUPP
25.0000 mg | Freq: Two times a day (BID) | RECTAL | 0 refills | Status: DC
Start: 2016-01-03 — End: 2016-08-31

## 2016-01-03 NOTE — ED Triage Notes (Signed)
Pt stated that he has bouts of IBS.  Hemorrhoid started about 1 month ago, has been having hard stools with constipation, 1-2 BM per week.  Took suppository las Saturday, hemorrhoid made it hard to walk and sit Monday.  Felt better Tuesday and Wednesday, played tennis Thursday, and he thinks he felt it pop while playing tennis.  It has been bleeding since.

## 2016-01-03 NOTE — ED Provider Notes (Signed)
[facility]    CSN: [id number] Arrival date & time: 01/03/16  1645     History   Chief Complaint Chief Complaint  Patient presents with  . Hemorrhoids    HPI Scott [name] a 50 y.o. male.   Patient has a history of IBS with constipation that had been worse during the past month resulting in the appearance of an external hemorrhoid.  His constipation has improved, but five days ago his hemorrhoid became painful.  Two days ago while playing tennis, the hemorrhoid suddenly began to bleed.  The pain has decreased, and he now has decreased bleeding from the hemorrhoid.   The history is provided by the patient.  Rectal Bleeding  Quality:  Bright red Amount:  Moderate Duration:  2 days Timing:  Intermittent Chronicity:  New Context: constipation, hemorrhoids, rectal pain and spontaneously   Pain details:    Quality:  Stabbing   Severity:  Mild   Duration:  2 days   Timing:  Constant   Progression:  Partially resolved Worsened by:  Defecation Ineffective treatments:  None tried Associated symptoms: no abdominal pain, no dizziness, no hematemesis and no light-headedness   Risk factors: hx of IBD   Risk factors: no anticoagulant use, no hx of colorectal cancer and no NSAID use     Past Medical History:  Diagnosis Date  . ADHD (attention deficit hyperactivity disorder)   . Appendicitis 1998  . Asthma    Exercise Induced  . Fracture of wrist 1984  . Fracture, maxillary (HCC) 2009  . Thyroid disease     Patient Active Problem List   Diagnosis Date Noted  . Seborrheic dermatitis 12/16/2015  . Right wrist pain 09/03/2015  . Headache, migraine 08/06/2015  . Greater trochanteric bursitis of left hip 08/06/2015  . Exercise-induced asthma 08/06/2015  . Attention deficit hyperactivity disorder 07/02/2011  . HLD (hyperlipidemia) 02/20/2011  . Avitaminosis D 02/20/2011  . Apnea, sleep 12/10/2010  . Adult hypothyroidism 10/29/2010    Past Surgical History:    Procedure Laterality Date  . APPENDECTOMY         Home Medications    Prior to Admission medications   Medication Sig Start Date End Date Taking? Authorizing Provider  diazepam (VALIUM) 10 MG tablet TAKE ONE TABLET (10 MG TOTAL) BY MOUTH AT BEDTIME AS NEEDED FOR ANXIETY. 07/24/15   Historical Provider, MD  hydrocortisone (ANUSOL-HC) 25 MG suppository Place 1 suppository (25 mg total) rectally 2 (two) times daily. 01/03/16   Lattie Haw, MD  ketoconazole (NIZORAL) 2 % cream Apply 1 application topically 2 (two) times daily. To affected areas. 12/16/15   Rodolph Bong, MD  PROAIR HFA 108 [phone] Base) MCG/ACT inhaler INHALE 2 PUFFS INTO THE LUNGS EVERY 6 (SIX) HOURS AS NEEDED FOR WHEEZING OR SHORTNESS OF BREATH. 10/18/15   Rodolph Bong, MD  testosterone enanthate (DELATESTRYL) 200 MG/ML injection Inject into the muscle every 14 (fourteen) days. For IM use only    Historical Provider, MD  Thyroid (NATURE-THROID) 113.75 MG TABS Take 112 mcg by mouth.    Historical Provider, MD    Family History Family History  Problem Relation Age of Onset  . Heart attack Father   . Emphysema Father   . Heart failure Father   . Alcohol abuse Maternal Aunt   . Alcohol abuse Maternal Uncle   . Diabetes Mother   . Hypertension Mother     Social History Social History  Substance Use Topics  . Smoking status:  Never Smoker  . Smokeless tobacco: Not on file  . Alcohol use No     Comment: One drink per week.     Allergies   Naproxen   Review of Systems Review of Systems  Gastrointestinal: Positive for hematochezia. Negative for abdominal pain and hematemesis.  Neurological: Negative for dizziness and light-headedness.  All other systems reviewed and are negative.    Physical Exam Triage Vital Signs ED Triage Vitals  Enc Vitals Group     BP 01/03/16 1707 121/73     Pulse Rate 01/03/16 1707 (!) 58     Resp --      Temp 01/03/16 1707 97.9 F (36.6 C)     Temp Source 01/03/16 1707 Oral      SpO2 01/03/16 1707 98 %     Weight 01/03/16 1708 197 lb 6.4 oz (89.5 kg)     Height 01/03/16 1708 5\' 8"  (1.727 m)     Head Circumference --      Peak Flow --      Pain Score 01/03/16 1709 2     Pain Loc --      Pain Edu? --      Excl. in GC? --    No data found.   Updated Vital Signs BP 121/73 (BP Location: Left Arm)   Pulse (!) 58   Temp 97.9 F (36.6 C) (Oral)   Ht 5\' 8"  (1.727 m)   Wt 197 lb 6.4 oz (89.5 kg)   SpO2 98%   BMI 30.01 kg/m   Visual Acuity Right Eye Distance:   Left Eye Distance:   Bilateral Distance:    Right Eye Near:   Left Eye Near:    Bilateral Near:     Physical Exam  Constitutional: He appears well-developed and well-nourished. No distress.  HENT:  Head: Normocephalic.  Mouth/Throat: Oropharynx is clear and moist.  Eyes: Pupils are equal, round, and reactive to light.  Neck: Neck supple.  Cardiovascular: Normal heart sounds.   Pulmonary/Chest: Breath sounds normal.  Abdominal: There is no tenderness.  Genitourinary: Rectal exam shows external hemorrhoid and tenderness.     Genitourinary Comments: At the left aspect of anus is a 1cm diameter thrombosed hemorrhoid that has partially drained.  Mild tenderness to palpation.  Minimal amount of bleeding persists.  Musculoskeletal: He exhibits no edema.  Neurological: He is alert.  Skin: Skin is warm and dry.  Nursing note and vitals reviewed.    UC Treatments / Results  Labs (all labs ordered are listed, but only abnormal results are displayed) Labs Reviewed - No data to display  EKG  EKG Interpretation None       Radiology No results found.  Procedures Procedures (including critical care time)  Medications Ordered in UC Medications - No data to display   Initial Impression / Assessment and Plan / UC Course  I have reviewed the triage vital signs and the nursing notes.  Pertinent labs & imaging results that were available during my care of the patient were reviewed by  me and considered in my medical decision making (see chart for details).  Clinical Course    [facility] supp[facility]itories BID Begin warm Sitz bath once or twice daily until healed. Expect that hemorrhoid will gradually regress now that it has ruptured. Followup with Family Doctor if not improved in one week.      Final Clinical Impressions(s) / UC Diagnoses   Final diagnoses:  Thrombosed external hemorrhoid    New  Prescriptions New Prescriptions   HYDROCORTISONE (ANUSOL-HC) 25 MG SUPPOSITORY    Place 1 suppository (25 mg total) rectally 2 (two) times daily.     Lattie HawStephen A Princes Finger, MD 01/07/16 [phone]

## 2016-01-03 NOTE — Discharge Instructions (Signed)
Begin warm Sitz bath once or twice daily until healed.

## 2016-01-24 DIAGNOSIS — F900 Attention-deficit hyperactivity disorder, predominantly inattentive type: Secondary | ICD-10-CM | POA: Diagnosis not present

## 2016-01-24 DIAGNOSIS — F325 Major depressive disorder, single episode, in full remission: Secondary | ICD-10-CM | POA: Diagnosis not present

## 2016-02-07 ENCOUNTER — Encounter: Payer: Self-pay | Admitting: Family Medicine

## 2016-02-07 ENCOUNTER — Ambulatory Visit (INDEPENDENT_AMBULATORY_CARE_PROVIDER_SITE_OTHER): Payer: BLUE CROSS/BLUE SHIELD | Admitting: Family Medicine

## 2016-02-07 VITALS — BP 111/65 | HR 67 | Ht 68.0 in | Wt 191.0 lb

## 2016-02-07 DIAGNOSIS — H6122 Impacted cerumen, left ear: Secondary | ICD-10-CM

## 2016-02-07 DIAGNOSIS — H6092 Unspecified otitis externa, left ear: Secondary | ICD-10-CM

## 2016-02-07 MED ORDER — NEOMYCIN-POLYMYXIN-HC 3.5-10000-1 OT SOLN: 3.0000 [drp] | Freq: Four times a day (QID) | OTIC | 0 refills | Status: DC

## 2016-02-07 NOTE — Progress Notes (Signed)
       Scott ArtisMark [name] a 50 y.o. male who [name] today for left ear pain. Patient notes a one-week history of left ear pain. He has pain with motion. He feels well otherwise. He has not tried any medicines yet.   Past Medical History:  Diagnosis Date  . ADHD (attention deficit hyperactivity disorder)   . Appendicitis 1998  . Asthma    Exercise Induced  . Fracture of wrist 1984  . Fracture, maxillary (HCC) 2009  . Thyroid disease    Past Surgical History:  Procedure Laterality Date  . APPENDECTOMY     Social History  Substance Use Topics  . Smoking status: Never Smoker  . Smokeless tobacco: Not on file  . Alcohol use No     Comment: One drink per week.   family history includes Alcohol abuse in his maternal aunt and maternal uncle; Diabetes in his mother; Emphysema in his father; Heart attack in his father; Heart failure in his father; Hypertension in his mother.  ROS as above:  Medications: Current Outpatient Prescriptions  Medication Sig Dispense Refill  . dexmethylphenidate (FOCALIN XR) 20 MG 24 hr capsule     . diazepam (VALIUM) 10 MG tablet TAKE ONE TABLET (10 MG TOTAL) BY MOUTH AT BEDTIME AS NEEDED FOR ANXIETY.  1  . hydrocortisone (ANUSOL-HC) 25 MG suppository Place 1 suppository (25 mg total) rectally 2 (two) times daily. 12 suppository 0  . ketoconazole (NIZORAL) 2 % cream Apply 1 application topically 2 (two) times daily. To affected areas. 60 g 3  . PROAIR HFA 108 (90 Base) MCG/ACT inhaler INHALE 2 PUFFS INTO THE LUNGS EVERY 6 (SIX) HOURS AS NEEDED FOR WHEEZING OR SHORTNESS OF BREATH. 8.5 Inhaler 1  . testosterone enanthate (DELATESTRYL) 200 MG/ML injection Inject into the muscle every 14 (fourteen) days. For IM use only    . Thyroid (NATURE-THROID) 113.75 MG TABS Take 112 mcg by mouth.    . neomycin-polymyxin-hydrocortisone  (CORTISPORIN) otic solution Place 3 drops into the left ear 4 (four) times daily. 10 mL 0   No current facility-administered medications for this visit.    Allergies  Allergen Reactions  . Naproxen Nausea And Vomiting    Health Maintenance Health Maintenance  Topic Date Due  . COLONOSCOPY  11/18/2013  . INFLUENZA VACCINE  10/24/2028 (Originally 09/24/2015)  . TETANUS/TDAP  04/08/2025  . Hepatitis C Screening  Completed  . HIV Screening  Addressed     Exam:  BP 111/65   Pulse 67   Ht 5\' 8"  (1.727 m)   Wt 191 lb (86.6 kg)   SpO2 100%   BMI 29.04 kg/m  Gen: Well NAD HEENT: EOMI,  MMM Left ear canal occluded by cerumen. Mildly tender to motion. Right tympanic membranes and ear canals are normal. Nontender mastoids. Lungs: Normal work of breathing. CTABL Heart: RRR no MRG Abd: NABS, Soft. Nondistended, Nontender Exts: Brisk capillary refill, warm and well perfused.   Cerumen irrigated and patient felt better. Ear canal is erythematous appearing tympanic membranes normal-appearing.   No results found for this or any previous visit (from the past 72 hour(s)). No results found.    Assessment and Plan: 50 y.o. male with cerumen impaction with resulting otitis externa. Treat with Cortisporin drops. Return as needed.   No orders of the defined types were placed in this encounter.   Discussed warning signs or symptoms. Please see discharge instructions. Patient expresses understanding.

## 2016-02-07 NOTE — Patient Instructions (Signed)
Thank you for coming in today.   Otitis Externa Otitis externa is a bacterial or fungal infection of the outer ear canal. This is the area from the eardrum to the outside of the ear. Otitis externa is sometimes called "swimmer's ear." CAUSES  Possible causes of infection include:  Swimming in dirty water.  Moisture remaining in the ear after swimming or bathing.  Mild injury (trauma) to the ear.  Objects stuck in the ear (foreign body).  Cuts or scrapes (abrasions) on the outside of the ear. SIGNS AND SYMPTOMS  The first symptom of infection is often itching in the ear canal. Later signs and symptoms may include swelling and redness of the ear canal, ear pain, and yellowish-white fluid (pus) coming from the ear. The ear pain may be worse when pulling on the earlobe. DIAGNOSIS  Your health care provider will perform a physical exam. A sample of fluid may be taken from the ear and examined for bacteria or fungi. TREATMENT  Antibiotic ear drops are often given for 10 to 14 days. Treatment may also include pain medicine or corticosteroids to reduce itching and swelling. HOME CARE INSTRUCTIONS   Apply antibiotic ear drops to the ear canal as prescribed by your health care provider.  Take medicines only as directed by your health care provider.  If you have diabetes, follow any additional treatment instructions from your health care provider.  Keep all follow-up visits as directed by your health care provider. PREVENTION   Keep your ear dry. Use the corner of a towel to absorb water out of the ear canal after swimming or bathing.  Avoid scratching or putting objects inside your ear. This can damage the ear canal or remove the protective wax that lines the canal. This makes it easier for bacteria and fungi to grow.  Avoid swimming in lakes, polluted water, or poorly chlorinated pools.  You may use ear drops made of rubbing alcohol and vinegar after swimming. Combine equal parts of  white vinegar and alcohol in a bottle. Put 3 or 4 drops into each ear after swimming. SEEK MEDICAL CARE IF:   You have a fever.  Your ear is still red, swollen, painful, or draining pus after 3 days.  Your redness, swelling, or pain gets worse.  You have a severe headache.  You have redness, swelling, pain, or tenderness in the area behind your ear. MAKE SURE YOU:   Understand these instructions.  Will watch your condition.  Will get help right away if you are not doing well or get worse. This information is not intended to replace advice given to you by your health care provider. Make sure you discuss any questions you have with your health care provider. Document Released: 02/09/2005 Document Revised: 03/02/2014 Document Reviewed: 11/19/2014 Elsevier Interactive Patient Education  2017 [employer]   Earwax Buildup Your ears make a substance called earwax. It may also be called cerumen. Sometimes, too much earwax builds up in your ear canal. This can cause ear pain and make it harder for you to hear. CAUSES This condition is caused by too much earwax production or buildup. RISK FACTORS The following factors may make you more likely to develop this condition:  Cleaning your ears often with swabs.  Having narrow ear canals.  Having earwax that is overly thick or sticky.  Having eczema.  Being dehydrated. SYMPTOMS Symptoms of this condition include:  Reduced hearing.  Ear drainage.  Ear pain.  Ear itch.  A feeling of fullness  in the ear or feeling that the ear is plugged.  Ringing in the ear.  Coughing. DIAGNOSIS Your health care provider can diagnose this condition based on your symptoms and medical history. Your health care provider will also do an ear exam to look inside your ear with a scope (otoscope). You may also have a hearing test. TREATMENT Treatment for this condition includes:  Over-the-counter or prescription ear drops to soften the  earwax.  Earwax removal by a health care provider. This may be done:  By flushing the ear with body-temperature water.  With a medical instrument that has a loop at the end (earwax curette).  With a suction device. HOME CARE INSTRUCTIONS  Take over-the-counter and prescription medicines only as told by your health care provider.  Do not put any objects, including an ear swab, into your ear. You can clean the opening of your ear canal with a washcloth.  Drink enough water to keep your urine clear or pale yellow.  If you have frequent earwax buildup or you use hearing aids, consider seeing your health care provider every 6-12 months for routine preventive ear cleanings. Keep all follow-up visits as told by your health care provider. SEEK MEDICAL CARE IF:  You have ear pain.  Your condition does not improve with treatment.  You have hearing loss.  You have blood, pus, or other fluid coming from your ear. This information is not intended to replace advice given to you by your health care provider. Make sure you discuss any questions you have with your health care provider. Document Released: 03/19/2004 Document Revised: 06/03/2015 Document Reviewed: 09/26/2014 Elsevier Interactive Patient Education  2017 [employer]

## 2016-02-27 DIAGNOSIS — E559 Vitamin D deficiency, unspecified: Secondary | ICD-10-CM | POA: Diagnosis not present

## 2016-02-27 DIAGNOSIS — E291 Testicular hypofunction: Secondary | ICD-10-CM | POA: Diagnosis not present

## 2016-02-27 DIAGNOSIS — R7303 Prediabetes: Secondary | ICD-10-CM | POA: Diagnosis not present

## 2016-02-27 DIAGNOSIS — E039 Hypothyroidism, unspecified: Secondary | ICD-10-CM | POA: Diagnosis not present

## 2016-02-27 DIAGNOSIS — R5383 Other fatigue: Secondary | ICD-10-CM | POA: Diagnosis not present

## 2016-03-09 DIAGNOSIS — F325 Major depressive disorder, single episode, in full remission: Secondary | ICD-10-CM | POA: Diagnosis not present

## 2016-03-09 DIAGNOSIS — F900 Attention-deficit hyperactivity disorder, predominantly inattentive type: Secondary | ICD-10-CM | POA: Diagnosis not present

## 2016-03-11 DIAGNOSIS — E039 Hypothyroidism, unspecified: Secondary | ICD-10-CM | POA: Diagnosis not present

## 2016-03-11 DIAGNOSIS — R7303 Prediabetes: Secondary | ICD-10-CM | POA: Diagnosis not present

## 2016-03-11 DIAGNOSIS — E7212 Methylenetetrahydrofolate reductase deficiency: Secondary | ICD-10-CM | POA: Diagnosis not present

## 2016-03-11 DIAGNOSIS — F329 Major depressive disorder, single episode, unspecified: Secondary | ICD-10-CM | POA: Diagnosis not present

## 2016-03-18 ENCOUNTER — Other Ambulatory Visit: Payer: Self-pay | Admitting: Family Medicine

## 2016-03-18 DIAGNOSIS — J45990 Exercise induced bronchospasm: Secondary | ICD-10-CM

## 2016-03-20 ENCOUNTER — Emergency Department (INDEPENDENT_AMBULATORY_CARE_PROVIDER_SITE_OTHER): Payer: BLUE CROSS/BLUE SHIELD

## 2016-03-20 ENCOUNTER — Emergency Department (INDEPENDENT_AMBULATORY_CARE_PROVIDER_SITE_OTHER)
Admission: EM | Admit: 2016-03-20 | Discharge: 2016-03-20 | Disposition: A | Discharge status: Home / Self Care | Payer: BLUE CROSS/BLUE SHIELD | Source: Home / Self Care | Attending: Family Medicine | Admitting: Family Medicine

## 2016-03-20 ENCOUNTER — Encounter: Payer: Self-pay | Admitting: *Deleted

## 2016-03-20 DIAGNOSIS — R0602 Shortness of breath: Secondary | ICD-10-CM | POA: Diagnosis not present

## 2016-03-20 DIAGNOSIS — J45901 Unspecified asthma with (acute) exacerbation: Secondary | ICD-10-CM

## 2016-03-20 DIAGNOSIS — R079 Chest pain, unspecified: Secondary | ICD-10-CM

## 2016-03-20 MED ORDER — MONTELUKAST SODIUM 10 MG PO TABS: 10.0000 mg | ORAL_TABLET | Freq: Every day | ORAL | 0 refills | Status: DC

## 2016-03-20 NOTE — ED Provider Notes (Signed)
CSN: [id number]     Arrival date & time 03/20/16  0807 History   First MD Initiated Contact with Patient 03/20/16 0820     Chief Complaint  Patient presents with  . Shortness of Breath   (Consider location/radiation/quality/duration/timing/severity/associated sxs/prior Treatment) HPI Scott [name] a 51 y.o. male presenting to UC with c/o 1 day of SOB and Right sided lung/chest pain while exercising this morning.  Pain was sharp in nature but resolved with rest.  Pt notes since yesterday he has felt the urge to yawn like he cannot get enough air.  He also reports nasal congestion about 2 week ago but states that has nearly resolved.  Denies wheeze but does recall having a "coughing spell" on his drive to UC.  No other coughing.  No fever, chills, n/v/d. Hx of exercise induced asthma. He uses an inhaler prior to working out, which has seemed to help. He used his inhaler this morning prior to working out but has not tried since having the chest pain as he was unsure he could use his inhaler more than once.  Denies chest pain or SOB at this time. Denies leg pain or swelling. No hx of blood clots or CAD.  He notes he is concerned he may have pneumonia as he has had that in the past.   Past Medical History:  Diagnosis Date  . ADHD (attention deficit hyperactivity disorder)   . Appendicitis 1998  . Asthma    Exercise Induced  . Fracture of wrist 1984  . Fracture, maxillary (HCC) 2009  . Thyroid disease    Past Surgical History:  Procedure Laterality Date  . APPENDECTOMY     Family History  Problem Relation Age of Onset  . Heart attack Father   . Emphysema Father   . Heart failure Father   . Alcohol abuse Maternal Aunt   . Alcohol abuse Maternal Uncle   . Diabetes Mother   . Hypertension Mother    Social History  Substance Use Topics  . Smoking status: Never Smoker  . Smokeless tobacco: Never Used  . Alcohol use No     Comment: One drink per week.    Review of Systems   Constitutional: Negative for chills, diaphoresis, fatigue and fever.  HENT: Positive for congestion ( minimal) and postnasal drip ( minimal). Negative for ear pain, sore throat, trouble swallowing and voice change.   Respiratory: Positive for cough and shortness of breath.   Cardiovascular: Negative for chest pain ( Right side) and palpitations.  Gastrointestinal: Negative for abdominal pain, diarrhea, nausea and vomiting.  Musculoskeletal: Negative for arthralgias, back pain and myalgias.  Skin: Negative for rash.  Neurological: Negative for dizziness, light-headedness and headaches.    Allergies  Naproxen  Home Medications   Prior to Admission medications   Medication Sig Start Date End Date Taking? Authorizing Provider  PROAIR HFA 108 (90 Base) MCG/ACT inhaler INHALE 2 PUFFS INTO THE LUNGS EVERY 6 (SIX) HOURS AS NEEDED FOR WHEEZING OR SHORTNESS OF BREATH. 03/19/16  Yes Rodolph BongEvan S Corey, MD  testosterone enanthate (DELATESTRYL) 200 MG/ML injection Inject into the muscle every 14 (fourteen) days. For IM use only   Yes Historical Provider, MD  Thyroid (NATURE-THROID) 113.75 MG TABS Take 112 mcg by mouth.   Yes Historical Provider, MD  dexmethylphenidate (FOCALIN XR) 20 MG 24 hr capsule  12/24/15   Historical Provider, MD  diazepam (VALIUM) 10 MG tablet TAKE ONE TABLET (10 MG TOTAL) BY MOUTH AT BEDTIME AS NEEDED FOR  ANXIETY. 07/24/15   Historical Provider, MD  hydrocortisone (ANUSOL-HC) 25 MG suppository Place 1 suppository (25 mg total) rectally 2 (two) times daily. 01/03/16   Lattie Haw, MD  ketoconazole (NIZORAL) 2 % cream Apply 1 application topically 2 (two) times daily. To affected areas. 12/16/15   Rodolph Bong, MD  montelukast (SINGULAIR) 10 MG tablet Take 1 tablet (10 mg total) by mouth at bedtime. 03/20/16   Junius Finner, PA-C  neomycin-polymyxin-hydrocortisone (CORTISPORIN) otic solution Place 3 drops into the left ear 4 (four) times daily. 02/07/16   Rodolph Bong, MD   Meds  Ordered and Administered this Visit  Medications - No data to display  BP 136/76 (BP Location: Left Arm)   Pulse 68   Temp 97.5 F (36.4 C) (Oral)   Resp 16   Ht 5\' 8"  (1.727 m)   Wt 190 lb (86.2 kg)   SpO2 99%   BMI 28.89 kg/m  No data found.   Physical Exam  Constitutional: He is oriented to person, place, and time. He appears well-developed and well-nourished. No distress.  HENT:  Head: Normocephalic and atraumatic.  Right Ear: Tympanic membrane normal.  Left Ear: Tympanic membrane normal.  Nose: Nose normal.  Mouth/Throat: Uvula is midline, oropharynx is clear and moist and mucous membranes are normal.  Eyes: EOM are normal.  Neck: Normal range of motion. Neck supple.  Cardiovascular: Normal rate and regular rhythm.   Pulmonary/Chest: Effort normal and breath sounds normal. No stridor. No respiratory distress. He has no wheezes. He has no rales. He exhibits no tenderness.  Musculoskeletal: Normal range of motion.  Lymphadenopathy:    He has no cervical adenopathy.  Neurological: He is alert and oriented to person, place, and time.  Skin: Skin is warm and dry. He is not diaphoretic.  Psychiatric: He has a normal mood and affect. His behavior is normal.  Nursing note and vitals reviewed.   Urgent Care Course     Procedures (including critical care time)  Labs Review Labs Reviewed - No data to display  Imaging Review Dg Chest 2 View  Result Date: 03/20/2016 CLINICAL DATA:  Asthma attack and shortness of Breath EXAM: CHEST  2 VIEW COMPARISON:  None. FINDINGS: The heart size and mediastinal contours are within normal limits. Both lungs are clear. The visualized skeletal structures are unremarkable. IMPRESSION: No active cardiopulmonary disease. Electronically Signed   By: Alcide Clever M.D.   On: 03/20/2016 09:03      MDM   1. SOB (shortness of breath)   2. Right-sided chest pain    Pt c/o Right side chest pain and SOB while exercising.   Vitals: WNL with O2  Sat 99% on RA and Pulse- 68. Doubt PE. Low concern for ACS.    CXR: normal Reassured pt. Discussed EKG, pt declined noting he was most concerned about pneumonia.  Rx: Singulair  Advised pt he can use his inhaler every 4-6 hours as needed. F/u with PCP if episodes keep coming back. Discussed symptoms that warrant emergent care in the ED.     Junius Finner, PA-C 03/20/16 1010

## 2016-03-20 NOTE — ED Triage Notes (Signed)
Pt c/o SOB x 1 day, RT lung pain during exercise this morning. Reports nasal congestion 2 wks ago that resolved. Denies wheezing or coughing.

## 2016-05-26 ENCOUNTER — Other Ambulatory Visit: Payer: Self-pay

## 2016-05-26 DIAGNOSIS — J45990 Exercise induced bronchospasm: Secondary | ICD-10-CM

## 2016-05-26 MED ORDER — ALBUTEROL SULFATE HFA 108 (90 BASE) MCG/ACT IN AERS: 2.0000 | INHALATION_SPRAY | Freq: Four times a day (QID) | RESPIRATORY_TRACT | 11 refills | Status: DC | PRN

## 2016-06-08 DIAGNOSIS — F900 Attention-deficit hyperactivity disorder, predominantly inattentive type: Secondary | ICD-10-CM | POA: Diagnosis not present

## 2016-06-08 DIAGNOSIS — F325 Major depressive disorder, single episode, in full remission: Secondary | ICD-10-CM | POA: Diagnosis not present

## 2016-06-12 ENCOUNTER — Encounter: Payer: Self-pay | Admitting: Family Medicine

## 2016-06-12 ENCOUNTER — Ambulatory Visit (INDEPENDENT_AMBULATORY_CARE_PROVIDER_SITE_OTHER): Payer: BLUE CROSS/BLUE SHIELD | Admitting: Family Medicine

## 2016-06-12 VITALS — BP 106/67 | HR 76 | Wt 185.0 lb

## 2016-06-12 DIAGNOSIS — M25511 Pain in right shoulder: Secondary | ICD-10-CM

## 2016-06-12 MED ORDER — DIAZEPAM 10 MG PO TABS: ORAL_TABLET | ORAL | 1 refills | Status: DC

## 2016-06-12 MED ORDER — DICLOFENAC SODIUM 1 % TD GEL: 2.0000 g | Freq: Four times a day (QID) | TRANSDERMAL | 11 refills | Status: DC

## 2016-06-12 NOTE — Progress Notes (Signed)
Correll Denbow is a 51 y.o. male who presents to [facility] today for right shoulder pain.   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 He also lifts weights regularly. He notes a week long history of right shoulder pain. The pain is mild and is worse with reaching up. He notes some tightness with internal rotation as well.  He has reduced weight lifting and tried some relative rest which is helped a little. He denies any radiating pain weakness or numbness fevers or chills. He feels well otherwise.   Past Medical History:  Diagnosis Date  . ADHD (attention deficit hyperactivity disorder)   . Appendicitis 1998  . Asthma    Exercise Induced  . Fracture of wrist 1984  . Fracture, maxillary (HCC) 2009  . Thyroid disease    Past Surgical History:  Procedure Laterality Date  . APPENDECTOMY     Social History  Substance Use Topics  . Smoking status: Never Smoker  . Smokeless tobacco: Never Used  . Alcohol use No     Comment: One drink per week.     ROS:  As above   Medications: Current Outpatient Prescriptions  Medication Sig Dispense Refill  . albuterol (PROAIR HFA) 108 (90 Base) MCG/ACT inhaler Inhale 2 puffs into the lungs every 6 (six) hours as needed for wheezing or shortness of breath. 8.5 Inhaler 11  . dexmethylphenidate (FOCALIN XR) 20 MG 24 hr capsule     . diazepam (VALIUM) 10 MG tablet TAKE ONE TABLET (10 MG TOTAL) BY MOUTH AT BEDTIME AS NEEDED FOR ANXIETY. 30 tablet 1  . diclofenac sodium (VOLTAREN) 1 % GEL Apply 2 g topically 4 (four) times daily. To affected joint. 100 g 11  . hydrocortisone (ANUSOL-HC) 25 MG suppository Place 1 suppository (25 mg total) rectally 2 (two) times daily. 12 suppository 0  . ketoconazole (NIZORAL) 2 % cream Apply 1 application topically 2 (two) times daily. To affected areas. 60 g 3  . neomycin-polymyxin-hydrocortisone (CORTISPORIN) otic solution Place 3 drops into the left ear 4 (four) times daily. 10  mL 0  . testosterone enanthate (DELATESTRYL) 200 MG/ML injection Inject into the muscle every 14 (fourteen) days. For IM use only    . Thyroid (NATURE-THROID) 113.75 MG TABS Take 112 mcg by mouth.     No current facility-administered medications for this visit.    Allergies  Allergen Reactions  . Naproxen Nausea And Vomiting     Exam:  BP 106/67   Pulse 76   Wt 185 lb (83.9 kg)   BMI 28.13 kg/m  General: Well Developed, well nourished, and in no acute distress.  Neuro/Psych: Alert and oriented x3, extra-ocular muscles intact, able to move all 4 extremities, sensation grossly intact. Skin: Warm and dry, no rashes noted.  Respiratory: Not using accessory muscles, speaking in full sentences, trachea midline.  Cardiovascular: Pulses palpable, no extremity edema. Abdomen: Does not appear distended. MSK: Nontender. Normal range of motion with mild painful abduction arc. Negative Hawkins and Neer's test. Mildly positive full can and empty can test. Mildly positive crossover arm compression test. Mildly positive pain in the belly of the biceps muscle with resisted elbow flexion. No pain at the anterior shoulder with biceps flexion or forearm. Strength is intact Pulses capillary refill and sensation intact distally.    No results found for this or any previous visit (from the past 48 hour(s)). No results found.    Assessment and Plan: 51 y.o. male with right shoulder pain  likely due to mild rotator cuff tendinopathy with some biceps muscle irritation. Plan for relative rest with physical therapy with eccentric exercises. Will use diclofenac gel as well. Return if not improving.    Orders Placed This Encounter  Procedures  . Ambulatory referral to Physical Therapy    Referral Priority:   Routine    Referral Type:   Physical Medicine    Referral Reason:   Specialty Services Required    Requested Specialty:   Physical Therapy    Number of Visits Requested:   1    Discussed  warning signs or symptoms. Please see discharge instructions. Patient expresses understanding.

## 2016-06-12 NOTE — Patient Instructions (Signed)
Thank you for coming in today. Attend PT if not better.  Work on exercises at home.  Use voltaren gel for pain as needed.    Shoulder Impingement Syndrome Rehab Ask your health care provider which exercises are safe for you. Do exercises exactly as told by your health care provider and adjust them as directed. It is normal to feel mild stretching, pulling, tightness, or discomfort as you do these exercises, but you should stop right away if you feel sudden pain or your pain gets worse.Do not begin these exercises until told by your health care provider. Stretching and range of motion exercise This exercise warms up your muscles and joints and improves the movement and flexibility of your shoulder. This exercise also helps to relieve pain and stiffness. Exercise A: Passive horizontal adduction   1. Sit or stand and pull your left / right elbow across your chest, toward your other shoulder. Stop when you feel a gentle stretch in the back of your shoulder and upper arm.  Keep your arm at shoulder height.  Keep your arm as close to your body as you comfortably can. 2. Hold for __________ seconds. 3. Slowly return to the starting position. Repeat __________ times. Complete this exercise __________ times a day. Strengthening exercises These exercises build strength and endurance in your shoulder. Endurance is the ability to use your muscles for a long time, even after they get tired. Exercise B: External rotation, isometric  1. Stand or sit in a doorway, facing the door frame. 2. Bend your left / right elbow and place the back of your wrist against the door frame. Only your wrist should be touching the frame. Keep your upper arm at your side. 3. Gently press your wrist against the door frame, as if you are trying to push your arm away from your abdomen.  Avoid shrugging your shoulder while you press your hand against the door frame. Keep your shoulder blade tucked down toward the middle of your  back. 4. Hold for __________ seconds. 5. Slowly release the tension, and relax your muscles completely before you do the exercise again. Repeat __________ times. Complete this exercise __________ times a day. Exercise C: Internal rotation, isometric   1. Stand or sit in a doorway, facing the door frame. 2. Bend your left / right elbow and place the inside of your wrist against the door frame. Only your wrist should be touching the frame. Keep your upper arm at your side. 3. Gently press your wrist against the door frame, as if you are trying to push your arm toward your abdomen.  Avoid shrugging your shoulder while you press your hand against the door frame. Keep your shoulder blade tucked down toward the middle of your back. 4. Hold for __________ seconds. 5. Slowly release the tension, and relax your muscles completely before you do the exercise again. Repeat __________ times. Complete this exercise __________ times a day. Exercise D: Scapular protraction, supine   1. Lie on your back on a firm surface. Hold a __________ weight in your left / right hand. 2. Raise your left / right arm straight into the air so your hand is directly above your shoulder joint. 3. Push the weight into the air so your shoulder lifts off of the surface that you are lying on. Do not move your head, neck, or back. 4. Hold for __________ seconds. 5. Slowly return to the starting position. Let your muscles relax completely before you repeat this exercise. Repeat  __________ times. Complete this exercise __________ times a day. Exercise E: Scapular retraction   1. Sit in a stable chair without armrests, or stand. 2. Secure an exercise band to a stable object in front of you so the band is at shoulder height. 3. Hold one end of the exercise band in each hand. Your palms should face down. 4. Squeeze your shoulder blades together and move your elbows slightly behind you. Do not shrug your shoulders while you do  this. 5. Hold for __________ seconds. 6. Slowly return to the starting position. Repeat __________ times. Complete this exercise __________ times a day. Exercise F: Shoulder extension   1. Sit in a stable chair without armrests, or stand. 2. Secure an exercise band to a stable object in front of you where the band is above shoulder height. 3. Hold one end of the exercise band in each hand. 4. Straighten your elbows and lift your hands up to shoulder height. 5. Squeeze your shoulder blades together and pull your hands down to the sides of your thighs. Stop when your hands are straight down by your sides. Do not let your hands go behind your body. 6. Hold for __________ seconds. 7. Slowly return to the starting position. Repeat __________ times. Complete this exercise __________ times a day. This information is not intended to replace advice given to you by your health care provider. Make sure you discuss any questions you have with your health care provider. Document Released: 02/09/2005 Document Revised: 10/17/2015 Document Reviewed: 01/12/2015 Elsevier Interactive Patient Education  2017 [employer].

## 2016-06-19 DIAGNOSIS — E559 Vitamin D deficiency, unspecified: Secondary | ICD-10-CM | POA: Diagnosis not present

## 2016-06-19 DIAGNOSIS — R5383 Other fatigue: Secondary | ICD-10-CM | POA: Diagnosis not present

## 2016-06-19 DIAGNOSIS — E291 Testicular hypofunction: Secondary | ICD-10-CM | POA: Diagnosis not present

## 2016-06-19 DIAGNOSIS — E039 Hypothyroidism, unspecified: Secondary | ICD-10-CM | POA: Diagnosis not present

## 2016-06-19 DIAGNOSIS — R7303 Prediabetes: Secondary | ICD-10-CM | POA: Diagnosis not present

## 2016-06-23 ENCOUNTER — Telehealth: Payer: Self-pay | Admitting: Family Medicine

## 2016-06-23 NOTE — Telephone Encounter (Signed)
Received fax from [facility] and they denied diclofenac sodium due to it is part of a step therapy program. - CF

## 2016-06-24 DIAGNOSIS — F320 Major depressive disorder, single episode, mild: Secondary | ICD-10-CM | POA: Diagnosis not present

## 2016-06-24 DIAGNOSIS — F900 Attention-deficit hyperactivity disorder, predominantly inattentive type: Secondary | ICD-10-CM | POA: Diagnosis not present

## 2016-06-25 NOTE — Telephone Encounter (Signed)
Message left on vm 

## 2016-06-25 NOTE — Telephone Encounter (Signed)
This medicine is $22 at [facility] with a coupon.

## 2016-07-07 DIAGNOSIS — F325 Major depressive disorder, single episode, in full remission: Secondary | ICD-10-CM | POA: Diagnosis not present

## 2016-07-07 DIAGNOSIS — F329 Major depressive disorder, single episode, unspecified: Secondary | ICD-10-CM | POA: Diagnosis not present

## 2016-07-07 DIAGNOSIS — E7212 Methylenetetrahydrofolate reductase deficiency: Secondary | ICD-10-CM | POA: Diagnosis not present

## 2016-07-07 DIAGNOSIS — R7989 Other specified abnormal findings of blood chemistry: Secondary | ICD-10-CM | POA: Diagnosis not present

## 2016-07-07 DIAGNOSIS — F900 Attention-deficit hyperactivity disorder, predominantly inattentive type: Secondary | ICD-10-CM | POA: Diagnosis not present

## 2016-07-07 DIAGNOSIS — E039 Hypothyroidism, unspecified: Secondary | ICD-10-CM | POA: Diagnosis not present

## 2016-07-24 DIAGNOSIS — F325 Major depressive disorder, single episode, in full remission: Secondary | ICD-10-CM | POA: Diagnosis not present

## 2016-07-24 DIAGNOSIS — F900 Attention-deficit hyperactivity disorder, predominantly inattentive type: Secondary | ICD-10-CM | POA: Diagnosis not present

## 2016-08-05 ENCOUNTER — Emergency Department (INDEPENDENT_AMBULATORY_CARE_PROVIDER_SITE_OTHER)
Admission: EM | Admit: 2016-08-05 | Discharge: 2016-08-05 | Disposition: A | Discharge status: Home / Self Care | Payer: BLUE CROSS/BLUE SHIELD | Source: Home / Self Care | Attending: Family Medicine | Admitting: Family Medicine

## 2016-08-05 ENCOUNTER — Encounter: Payer: Self-pay | Admitting: Emergency Medicine

## 2016-08-05 DIAGNOSIS — B9789 Other viral agents as the cause of diseases classified elsewhere: Secondary | ICD-10-CM

## 2016-08-05 DIAGNOSIS — J069 Acute upper respiratory infection, unspecified: Secondary | ICD-10-CM

## 2016-08-05 LAB — POCT RAPID STREP A (OFFICE): Rapid Strep A Screen: NEGATIVE

## 2016-08-05 MED ORDER — PREDNISONE 20 MG PO TABS: ORAL_TABLET | ORAL | 0 refills | Status: DC

## 2016-08-05 MED ORDER — BENZONATATE 200 MG PO CAPS: ORAL_CAPSULE | ORAL | 0 refills | Status: DC

## 2016-08-05 MED ORDER — AZITHROMYCIN 250 MG PO TABS: ORAL_TABLET | ORAL | 0 refills | Status: DC

## 2016-08-05 NOTE — Discharge Instructions (Signed)
Take plain guaifenesin (1200mg  extended release tabs such as Mucinex) twice daily, with plenty of water, for cough and congestion.  May add Pseudoephedrine (30mg , one or two every 4 to 6 hours) for sinus congestion.  Get adequate rest.   May use Afrin nasal spray (or generic oxymetazoline) each morning for about 5 days and then discontinue.  Also recommend using saline nasal spray several times daily and saline nasal irrigation (AYR is a common brand).  Use Flonase nasal spray each morning after using Afrin nasal spray and saline nasal irrigation. Try warm salt water gargles for sore throat.  Stop all antihistamines for now, and other non-prescription cough/cold preparations. Continue albuterol inhaler as needed. Begin Azithromycin if not improving about one week or if persistent fever develops   Follow-up with family doctor if not improving about10 days.

## 2016-08-05 NOTE — ED Triage Notes (Addendum)
Productive cough and sore throat x 3 days, wife has positive strep. Hx of pneumonia, chest hurts when he coughs.

## 2016-08-05 NOTE — ED Provider Notes (Signed)
[facility]    CSN: [id number] Arrival date & time: 08/05/16  1135     History   Chief Complaint Chief Complaint  Patient presents with  . Cough    HPI Scott [name] a 51 y.o. male.   Patient complains of three day history of typical cold-like symptoms including sore throat, headache, fatigue, and cough.  Today he has felt tightness in his anterior chest, and his right ear feels clogged.  His wife was recently diagnosed with strep throat.  He has a history of seasonal rhinitis, and exercise induced asthma.   The history is provided by the patient.    Past Medical History:  Diagnosis Date  . ADHD (attention deficit hyperactivity disorder)   . Appendicitis 1998  . Asthma    Exercise Induced  . Fracture of wrist 1984  . Fracture, maxillary (HCC) 2009  . Thyroid disease     Patient Active Problem List   Diagnosis Date Noted  . Seborrheic dermatitis 12/16/2015  . Right wrist pain 09/03/2015  . Headache, migraine 08/06/2015  . Greater trochanteric bursitis of left hip 08/06/2015  . Exercise-induced asthma 08/06/2015  . Attention deficit hyperactivity disorder 07/02/2011  . HLD (hyperlipidemia) 02/20/2011  . Avitaminosis D 02/20/2011  . Apnea, sleep 12/10/2010  . Adult hypothyroidism 10/29/2010    Past Surgical History:  Procedure Laterality Date  . APPENDECTOMY         Home Medications    Prior to Admission medications   Medication Sig Start Date End Date Taking? Authorizing Provider  albuterol (PROAIR HFA) 108 (90 Base) MCG/ACT inhaler Inhale 2 puffs into the lungs every 6 (six) hours as needed for wheezing or shortness of breath. 05/26/16   Rodolph Bong, MD  azithromycin (ZITHROMAX Z-PAK) 250 MG tablet Take 2 tabs today; then begin one tab once daily for 4 more days. (Rx void after 09/10/13) 08/05/16   Lattie Haw, MD  benzonatate (TESSALON) 200 MG capsule Take one cap by mouth at bedtime as needed for cough.  May repeat in 4 to 6 hours  08/05/16   Lattie Haw, MD  dexmethylphenidate (FOCALIN XR) 20 MG 24 hr capsule  12/24/15   [provider]  diazepam (VALIUM) 10 MG tablet TAKE ONE TABLET (10 MG TOTAL) BY MOUTH AT BEDTIME AS NEEDED FOR ANXIETY. 06/12/16   Rodolph Bong, MD  diclofenac sodium (VOLTAREN) 1 % GEL Apply 2 g topically 4 (four) times daily. To affected joint. 06/12/16   Rodolph Bong, MD  hydrocortisone (ANUSOL-HC) 25 MG suppository Place 1 suppository (25 mg total) rectally 2 (two) times daily. 01/03/16   Lattie Haw, MD  ketoconazole (NIZORAL) 2 % cream Apply 1 application topically 2 (two) times daily. To affected areas. 12/16/15   Rodolph Bong, MD  neomycin-polymyxin-hydrocortisone (CORTISPORIN) otic solution Place 3 drops into the left ear 4 (four) times daily. 02/07/16   Rodolph Bong, MD  predniSONE (DELTASONE) 20 MG tablet Take one tab by mouth twice daily for 5 days, then one daily for 3 days. Take with food. 08/05/16   Lattie Haw, MD  testosterone enanthate (DELATESTRYL) 200 MG/ML injection Inject into the muscle every 14 (fourteen) days. For IM use only    [provider]  Thyroid (NATURE-THROID) 113.75 MG TABS Take 112 mcg by mouth.    [provider]    Family History Family History  Problem Relation Age of Onset  . Heart attack Father   . Emphysema Father   .  Heart failure Father   . Alcohol abuse Maternal Aunt   . Alcohol abuse Maternal Uncle   . Diabetes Mother   . Hypertension Mother     Social History Social History  Substance Use Topics  . Smoking status: Never Smoker  . Smokeless tobacco: Never Used  . Alcohol use No     Comment: One drink per week.     Allergies   Naproxen   Review of Systems Review of Systems + sore throat + cough No pleuritic pain No wheezing + nasal congestion + post-nasal drainage No sinus pain/pressure No itchy/red eyes ? earache No hemoptysis No SOB ? fever, + chills No nausea No vomiting No abdominal  pain No diarrhea No urinary symptoms No skin rash + fatigue No myalgias + headache Used OTC meds without relief   Physical Exam Triage Vital Signs ED Triage Vitals  Enc Vitals Group     BP 08/05/16 1158 108/72     Pulse Rate 08/05/16 1158 60     Resp --      Temp 08/05/16 1158 98.3 F (36.8 C)     Temp Source 08/05/16 1158 Oral     SpO2 08/05/16 1158 96 %     Weight 08/05/16 1200 188 lb (85.3 kg)     Height 08/05/16 1200 5\' 8"  (1.727 m)     Head Circumference --      Peak Flow --      Pain Score 08/05/16 1203 5     Pain Loc --      Pain Edu? --      Excl. in GC? --    No data found.   Updated Vital Signs BP 108/72 (BP Location: Left Arm)   Pulse 60   Temp 98.3 F (36.8 C) (Oral)   Ht 5\' 8"  (1.727 m)   Wt 188 lb (85.3 kg)   SpO2 96%   BMI 28.59 kg/m   Visual Acuity Right Eye Distance:   Left Eye Distance:   Bilateral Distance:    Right Eye Near:   Left Eye Near:    Bilateral Near:     Physical Exam Nursing notes and Vital Signs reviewed. Appearance:  Patient appears stated age, and in no acute distress Eyes:  Pupils are equal, round, and reactive to light and accomodation.  Extraocular movement is intact.  Conjunctivae are not inflamed  Ears:  Canals normal.  Tympanic membranes normal.  Nose:  Mildly congested turbinates.  No sinus tenderness.    Pharynx:  Uvula edematous. Neck:  Supple.  Enlarged posterior/lateral nodes are palpated bilaterally, tender to palpation on the left.   Lungs:  Clear to auscultation.  Breath sounds are equal.  Moving air well. Heart:  Regular rate and rhythm without murmurs, rubs, or gallops.  Abdomen:  Nontender without masses or hepatosplenomegaly.  Bowel sounds are present.  No CVA or flank tenderness.  Extremities:  No edema.  Skin:  No rash present.    UC Treatments / Results  Labs (all labs ordered are listed, but only abnormal results are displayed) Labs Reviewed -  POCT rapid strep test negative  EKG  EKG  Interpretation None       Radiology No results found.  Procedures Procedures (including critical care time)  Medications Ordered in UC Medications - No data to display   Initial Impression / Assessment and Plan / UC Course  I have reviewed the triage vital signs and the nursing notes.  Pertinent labs & imaging results that  were available during my care of the patient were reviewed by me and considered in my medical decision making (see chart for details).    There is no evidence of bacterial infection today.   Begin prednisone burst/taper.  Prescription written for Benzonatate [facility](Tessalon) to take at bedtime for night-time cough.  Take plain guaifenesin (1200mg  extended release tabs such as Mucinex) twice daily, with plenty of water, for cough and congestion.  May add Pseudoephedrine (30mg , one or two every 4 to 6 hours) for sinus congestion.  Get adequate rest.   May use Afrin nasal spray (or generic oxymetazoline) each morning for about 5 days and then discontinue.  Also recommend using saline nasal spray several times daily and saline nasal irrigation (AYR is a common brand).  Use Flonase nasal spray each morning after using Afrin nasal spray and saline nasal irrigation. Try warm salt water gargles for sore throat.  Stop all antihistamines for now, and other non-prescription cough/cold preparations. Continue albuterol inhaler as needed. Begin Azithromycin if not improving about one week or if persistent fever develops (Given a prescription to hold, with an expiration date)  Follow-up with family doctor if not improving about10 days.     Final Clinical Impressions(s) / UC Diagnoses   Final diagnoses:  Viral URI with cough    New Prescriptions New Prescriptions   AZITHROMYCIN (ZITHROMAX Z-PAK) 250 MG TABLET    Take 2 tabs today; then begin one tab once daily for 4 more days. (Rx void after 09/10/13)   BENZONATATE (TESSALON) 200 MG CAPSULE    Take one cap by mouth at bedtime as  needed for cough.  May repeat in 4 to 6 hours   PREDNISONE (DELTASONE) 20 MG TABLET    Take one tab by mouth twice daily for 5 days, then one daily for 3 days. Take with food.     Lattie HawBeese, Hillarie Harrigan A, MD 08/06/16 1535

## 2016-08-06 DIAGNOSIS — F320 Major depressive disorder, single episode, mild: Secondary | ICD-10-CM | POA: Diagnosis not present

## 2016-08-06 DIAGNOSIS — F900 Attention-deficit hyperactivity disorder, predominantly inattentive type: Secondary | ICD-10-CM | POA: Diagnosis not present

## 2016-08-12 ENCOUNTER — Telehealth: Payer: Self-pay | Admitting: Family Medicine

## 2016-08-27 NOTE — Telephone Encounter (Signed)
Note opened in error.

## 2016-08-31 ENCOUNTER — Encounter: Payer: Self-pay | Admitting: Family Medicine

## 2016-08-31 ENCOUNTER — Ambulatory Visit (INDEPENDENT_AMBULATORY_CARE_PROVIDER_SITE_OTHER): Payer: BLUE CROSS/BLUE SHIELD | Admitting: Family Medicine

## 2016-08-31 VITALS — BP 117/64 | HR 73 | Temp 97.9°F | Wt 191.0 lb

## 2016-08-31 DIAGNOSIS — L02214 Cutaneous abscess of groin: Secondary | ICD-10-CM

## 2016-08-31 DIAGNOSIS — J0101 Acute recurrent maxillary sinusitis: Secondary | ICD-10-CM

## 2016-08-31 MED ORDER — CLINDAMYCIN HCL 300 MG PO CAPS: 300.0000 mg | ORAL_CAPSULE | Freq: Three times a day (TID) | ORAL | 0 refills | Status: DC

## 2016-08-31 NOTE — Patient Instructions (Signed)
Thank you for coming in today. Take clindamycin three times daily for 1 week.  Return if not better.    Incision and Drainage, Care After Refer to this sheet in the next few weeks. These instructions provide you with information about caring for yourself after your procedure. Your health care provider may also give you more specific instructions. Your treatment has been planned according to current medical practices, but problems sometimes occur. Call your health care provider if you have any problems or questions after your procedure. What can I expect after the procedure? After the procedure, it is common to have:  Pain or discomfort around your incision site.  Drainage from your incision.  Follow these instructions at home:  Take over-the-counter and prescription medicines only as told by your health care provider.  If you were prescribed an antibiotic medicine, take it as told by your health care provider.Do not stop taking the antibiotic even if you start to feel better.  Followinstructions from your health care provider about: ? How to take care of your incision. ? When and how you should change your packing and bandage (dressing). Wash your hands with soap and water before you change your dressing. If soap and water are not available, use hand sanitizer. ? When you should remove your dressing.  Do not take baths, swim, or use a hot tub until your health care provider approves.  Keep all follow-up visits as told by your health care provider. This is important.  Check your incision area every day for signs of infection. Check for: ? More redness, swelling, or pain. ? More fluid or blood. ? Warmth. ? Pus or a bad smell. Contact a health care provider if:  Your cyst or abscess returns.  You have a fever.  You have more redness, swelling, or pain around your incision.  You have more fluid or blood coming from your incision.  Your incision feels warm to the  touch.  You have pus or a bad smell coming from your incision. Get help right away if:  You have severe pain or bleeding.  You cannot eat or drink without vomiting.  You have decreased urine output.  You become short of breath.  You have chest pain.  You cough up blood.  The area where the incision and drainage occurred becomes numb or it tingles. This information is not intended to replace advice given to you by your health care provider. Make sure you discuss any questions you have with your health care provider. Document Released: 05/04/2011 Document Revised: 07/12/2015 Document Reviewed: 11/30/2014 Elsevier Interactive Patient Education  2017 [employer]

## 2016-08-31 NOTE — Progress Notes (Signed)
Scott [name] a 51 y.o. male who presents to [name] Primary [facility] today for abscess left groin. Patient has a several day history of pain and swelling of the left groin. He's had a small bump in this area for several months that has slowly grown. He notes the painful red bump started draining purulent material yesterday. He denies any fevers vomiting or diarrhea.  He does however note a several day history of runny nose cough congestion and sinus pain and pressure. Symptoms are consistent with sinusitis. Symptoms have been ongoing now for about a week and have worsened despite over-the-counter medications.   Past Medical History:  Diagnosis Date  . ADHD (attention deficit hyperactivity disorder)   . Appendicitis 1998  . Asthma    Exercise Induced  . Fracture of wrist 1984  . Fracture, maxillary (HCC) 2009  . Thyroid disease    Past Surgical History:  Procedure Laterality Date  . APPENDECTOMY     Social History  Substance Use Topics  . Smoking status: Never Smoker  . Smokeless tobacco: Never Used  . Alcohol use No     Comment: One drink per week.   family history includes Alcohol abuse in his maternal aunt and maternal uncle; Diabetes in his mother; Emphysema in his father; Heart attack in his father; Heart failure in his father; Hypertension in his mother.  ROS as above:  Medications: Current Outpatient Prescriptions  Medication Sig Dispense Refill  . albuterol (PROAIR HFA) 108 (90 Base) MCG/ACT inhaler Inhale 2 puffs into the lungs every 6 (six) hours as needed for wheezing or shortness of breath. 8.5 Inhaler 11  . dexmethylphenidate (FOCALIN XR) 20 MG 24 hr capsule     . diazepam (VALIUM) 10 MG tablet TAKE ONE TABLET (10 MG TOTAL) BY MOUTH AT BEDTIME AS NEEDED FOR ANXIETY. 30 tablet 1  . testosterone enanthate (DELATESTRYL) 200 MG/ML injection Inject into the  muscle every 14 (fourteen) days. For IM use only    . Thyroid (NATURE-THROID) 113.75 MG TABS Take 112 mcg by mouth.    . clindamycin (CLEOCIN) 300 MG capsule Take 1 capsule (300 mg total) by mouth 3 (three) times daily. 21 capsule 0   No current facility-administered medications for this visit.    Allergies  Allergen Reactions  . Naproxen Nausea And Vomiting    Health Maintenance Health Maintenance  Topic Date Due  . COLONOSCOPY  11/18/2013  . INFLUENZA VACCINE  10/24/2028 (Originally 09/23/2016)  . TETANUS/TDAP  04/08/2025  . Hepatitis C Screening  Completed  . HIV Screening  Completed     Exam:  BP 117/64   Pulse 73   Temp 97.9 F (36.6 C) (Oral)   Wt 191 lb (86.6 kg)   SpO2 99%   BMI 29.04 kg/m  Gen: Well NAD HEENT: EOMI,  MMM  clear nasal discharge Lungs: Normal work of breathing. CTABL Heart: RRR no MRG Abd: NABS, Soft. Nondistended, Nontender Exts: Brisk capillary refill, warm and well perfused.  Skin: Swollen erythematous indurated area left groin with small area of fluctuance consistent in appearance with abscess. Nonpulsatile. Tender to touch.  Abscess incision and drainage. Consent obtained and timeout performed. Skin cleaned with alcohol, and cold spray applied. 5 mL of marcaine with epi injected achieving good anesthesia. Skin was again cleaned with alcohol. A sharp incision was made to the area of fluctuance. The incision was widened and pus was expressed. Pus was cultured. Blunt dissection was used  to break up loculations. Further pus was expressed. Patient tolerated the procedure well. A dressing was applied   No results found for this or any previous visit (from the past 72 hour(s)). No results found.    Assessment and Plan: 51 y.o. male with  Left groin abscess status post incision and drainage. This certainly skin is very indurated and I'm suspicious for some cellulitis as well. Culture is pending. Empiric treatment with clindamycin. Clindamycin  will probably help also for potential secondary sickening with sinusitis also. Plan for watchful waiting.   Orders Placed This Encounter  Procedures  . Culture, routine-abscess    Left groin   Meds ordered this encounter  Medications  . clindamycin (CLEOCIN) 300 MG capsule    Sig: Take 1 capsule (300 mg total) by mouth 3 (three) times daily.    Dispense:  21 capsule    Refill:  0     Discussed warning signs or symptoms. Please see discharge instructions. Patient expresses understanding.

## 2016-09-03 LAB — CULTURE, ROUTINE-ABSCESS
GRAM STAIN: NONE SEEN
Gram Stain: NONE SEEN
Gram Stain: NONE SEEN
Organism ID, Bacteria: NORMAL

## 2016-09-09 DIAGNOSIS — F325 Major depressive disorder, single episode, in full remission: Secondary | ICD-10-CM | POA: Diagnosis not present

## 2016-09-09 DIAGNOSIS — F900 Attention-deficit hyperactivity disorder, predominantly inattentive type: Secondary | ICD-10-CM | POA: Diagnosis not present

## 2016-09-24 DIAGNOSIS — F325 Major depressive disorder, single episode, in full remission: Secondary | ICD-10-CM | POA: Diagnosis not present

## 2016-09-24 DIAGNOSIS — F900 Attention-deficit hyperactivity disorder, predominantly inattentive type: Secondary | ICD-10-CM | POA: Diagnosis not present

## 2016-09-30 DIAGNOSIS — M62838 Other muscle spasm: Secondary | ICD-10-CM | POA: Diagnosis not present

## 2016-09-30 DIAGNOSIS — R51 Headache: Secondary | ICD-10-CM | POA: Diagnosis not present

## 2016-09-30 DIAGNOSIS — M2550 Pain in unspecified joint: Secondary | ICD-10-CM | POA: Diagnosis not present

## 2016-09-30 DIAGNOSIS — J45909 Unspecified asthma, uncomplicated: Secondary | ICD-10-CM | POA: Diagnosis not present

## 2016-10-01 ENCOUNTER — Ambulatory Visit (INDEPENDENT_AMBULATORY_CARE_PROVIDER_SITE_OTHER): Payer: BLUE CROSS/BLUE SHIELD

## 2016-10-01 ENCOUNTER — Encounter: Payer: Self-pay | Admitting: Family Medicine

## 2016-10-01 ENCOUNTER — Ambulatory Visit (INDEPENDENT_AMBULATORY_CARE_PROVIDER_SITE_OTHER): Payer: BLUE CROSS/BLUE SHIELD | Admitting: Family Medicine

## 2016-10-01 ENCOUNTER — Telehealth: Payer: Self-pay | Admitting: Family Medicine

## 2016-10-01 VITALS — BP 124/72 | HR 67 | Wt 202.0 lb

## 2016-10-01 DIAGNOSIS — M25562 Pain in left knee: Secondary | ICD-10-CM | POA: Diagnosis not present

## 2016-10-01 DIAGNOSIS — M1711 Unilateral primary osteoarthritis, right knee: Secondary | ICD-10-CM | POA: Diagnosis not present

## 2016-10-01 DIAGNOSIS — M1712 Unilateral primary osteoarthritis, left knee: Secondary | ICD-10-CM | POA: Diagnosis not present

## 2016-10-01 NOTE — Progress Notes (Signed)
Scott [name] a 51 y.o. male who presents to [facility] today for left knee pain.  Patient reports left knee pain for 2 weeks. He is a Sports coach. Patient reports that 2 weeks ago he completed an intense lower body workout and played tennis the next day. He did not recall any specific movement, injury, or popping sensation at the time, but states that he has had severe left knee pain since this time. He reports the pain as sharp and 8/10 in severity. The pain is present even without activity. Patient reports a feeling of his knee buckling when going down stairs. He also feels catching and popping sensations with knee movement. Rest and ibuprofen have provided only temporary relief. Patient has also tried applying ice and heat, but these have not been helpful.   Patient denies any recent illness, back pain, hip pain, numbness or tingling.   Physical symptoms are dramatically affecting the quality of his life and interfere with his ability to work normally and exercise normally.  Past Medical History:  Diagnosis Date  . ADHD (attention deficit hyperactivity disorder)   . Appendicitis 1998  . Asthma    Exercise Induced  . Fracture of wrist 1984  . Fracture, maxillary (HCC) 2009  . Thyroid disease    Past Surgical History:  Procedure Laterality Date  . APPENDECTOMY     Social History  Substance Use Topics  . Smoking status: Never Smoker  . Smokeless tobacco: Never Used  . Alcohol use No     Comment: One drink per week.     ROS:  As above   Medications: Current Outpatient Prescriptions  Medication Sig Dispense Refill  . albuterol (PROAIR HFA) 108 (90 Base) MCG/ACT inhaler Inhale 2 puffs into the lungs every 6 (six) hours as needed for wheezing or shortness of breath. 8.5 Inhaler 11  . clindamycin (CLEOCIN) 300 MG capsule Take 1 capsule (300 mg total) by mouth 3 (three) times daily. 21 capsule 0  . dexmethylphenidate  (FOCALIN XR) 20 MG 24 hr capsule     . diazepam (VALIUM) 10 MG tablet TAKE ONE TABLET (10 MG TOTAL) BY MOUTH AT BEDTIME AS NEEDED FOR ANXIETY. 30 tablet 1  . testosterone enanthate (DELATESTRYL) 200 MG/ML injection Inject into the muscle every 14 (fourteen) days. For IM use only    . Thyroid (NATURE-THROID) 113.75 MG TABS Take 112 mcg by mouth.     No current facility-administered medications for this visit.    Allergies  Allergen Reactions  . Naproxen Nausea And Vomiting     Exam:  BP 124/72   Pulse 67   Wt 202 lb (91.6 kg)   BMI 30.71 kg/m  General: Well Developed, well nourished, and in no acute distress.  Neuro/Psych: Alert and oriented x3, extra-ocular muscles intact, able to move all 4 extremities, sensation grossly intact. Skin: Warm and dry, no rashes noted.  Respiratory: Not using accessory muscles, speaking in full sentences, trachea midline.  Cardiovascular: Pulses palpable, no extremity edema. Abdomen: Does not appear distended. MSK: Left knee: No gross deformity on inspection, minimal swelling present Tenderness to palpation at lateral apex of patella and lateral joint line  Range of motion is 0-120 Strength 5/5 with flexion and extension, flexion reproduces pain at lateral joint line No pain with McMurray test, Lachman testing negative, Posterior drawer testing is negative, but reproduces pain at lateral joint line,  Thessaly testing reproduces severe pain and popping sensation. Normal gait.  X-ray  knee pending  No results found for this or any previous visit (from the past 48 hour(s)). No results found.    Assessment and Plan: 51 y.o. male with left knee pain. Given patient's presentation, characterization of symptoms, and physical findings, I am concerned that he has torn his lateral meniscus. He will need a knee x-ray to further assess for any structural damage. If this is unremarkable, he will need an MRI to further assess for a meniscal tear. Patient has  mechanical symptoms and has symptoms that are interfering with his ability to exercise and work normally.    Orders Placed This Encounter  Procedures  . MR Knee Left  Wo Contrast    Standing Status:   Future    Standing Expiration Date:   12/01/2017    Order Specific Question:   What is the patient's sedation requirement?    Answer:   No Sedation    Order Specific Question:   Does the patient have a pacemaker or implanted devices?    Answer:   No    Order Specific Question:   Preferred imaging location?    Answer:   [facility] (table limit-[facility]lbs)    Order Specific Question:   Radiology Contrast Protocol - do NOT remove file path    Answer:   [email or web address]  . DG Knee 1-2 Views Right    Standing Status:   Future    Number of Occurrences:   1    Standing Expiration Date:   12/02/2017    Order Specific Question:   Reason for Exam (SYMPTOM  OR DIAGNOSIS REQUIRED)    Answer:   For use with the left knee x-ray bilateral AP and Rosenberg standing.    Order Specific Question:   Preferred imaging location?    Answer[facility]  . DG Knee Complete 4 Views Left    Please include patellar sunrise, lateral, and weightbearing bilateral AP and bilateral rosenberg views    Standing Status:   Future    Number of Occurrences:   1    Standing Expiration Date:   12/01/2017    Order Specific Question:   Reason for exam:    Answer:   Please include patellar sunrise, lateral, and weightbearing bilateral AP and bilateral rosenberg views    Comments:   Please include patellar sunrise, lateral, and weightbearing bilateral AP and bilateral rosenberg views    Order Specific Question:   Preferred imaging location?    Answer[facility] [place] [facility]   No orders of the defined types were placed in this encounter.   Discussed warning signs or symptoms. Please see discharge instructions. Patient expresses understanding.

## 2016-10-01 NOTE — Telephone Encounter (Signed)
AUTH for MR Knee Left Wo Contrast approved following peer to peer with provider as noted below.

## 2016-10-01 NOTE — Telephone Encounter (Signed)
Comf number  [id number]  10/01/16 until 10/30/16

## 2016-10-01 NOTE — Patient Instructions (Signed)
Thank you for coming in today. You should hear about the MRI time soon.  I think we will be able to do it Monday.  Follow up with me the Thursday (or so) after the MRI to go over results.    Meniscus Tear A meniscus tear is a knee injury in which a piece of the meniscus is torn. The meniscus is a thick, rubbery, wedge-shaped cartilage in the knee. Two menisci are located in each knee. They sit between the upper bone (femur) and lower bone (tibia) that make up the knee joint. Each meniscus acts as a shock absorber for the knee. A torn meniscus is one of the most common types of knee injuries. This injury can range from mild to severe. Surgery may be needed for a severe tear. What are the causes? This injury may be caused by any squatting, twisting, or pivoting movement. Sports-related injuries are the most common cause. These often occur from:  Running and stopping suddenly.  Changing direction.  Being tackled or knocked off your feet.  As people get older, their meniscus gets thinner and weaker. In these people, tears can happen more easily, such as from climbing stairs. What increases the risk? This injury is more likely to happen to:  People who play contact sports.  Males.  People who are 2330?53 years of age.  What are the signs or symptoms? Symptoms of this injury include:  Knee pain, especially at the side of the knee joint. You may feel pain when the injury occurs, or you may only hear a pop and feel pain later.  A feeling that your knee is clicking, catching, locking, or giving way.  Not being able to fully bend or extend your knee.  Bruising or swelling in your knee.  How is this diagnosed? This injury may be diagnosed based on your symptoms and a physical exam. The physical exam may include:  Moving your knee in different ways.  Feeling for tenderness.  Listening for a clicking sound.  Checking if your knee locks or catches.  You may also have tests, such  as:  X-rays.  MRI.  A procedure to look inside your knee with a narrow surgical telescope (arthroscopy).  You may be referred to a knee specialist (orthopedic surgeon). How is this treated? Treatment for this injury depends on the severity of the tear. Treatment for a mild tear may include:  Rest.  Medicine to reduce pain and swelling. This is usually a nonsteroidal anti-inflammatory drug (NSAID).  A knee brace or an elastic sleeve or wrap.  Using crutches or a walker to keep weight off your knee and to help you walk.  Exercises to strengthen your knee (physical therapy).  You may need surgery if you have a severe tear or if other treatments are not working. Follow these instructions at home: Managing pain and swelling  Take over-the-counter and prescription medicines only as told by your health care provider.  If directed, apply ice to the injured area: ? Put ice in a plastic bag. ? Place a towel between your skin and the bag. ? Leave the ice on for 20 minutes, 2-3 times per day.  Raise (elevate) the injured area above the level of your heart while you are sitting or lying down. Activity  Do not use the injured limb to support your body weight until your health care provider says that you can. Use crutches or a walker as told by your health care provider.  Return to your  normal activities as told by your health care provider. Ask your health care provider what activities are safe for you.  Perform range-of-motion exercises only as told by your health care provider.  Begin doing exercises to strengthen your knee and leg muscles only as told by your health care provider. After you recover, your health care provider may recommend these exercises to help prevent another injury. General instructions  Use a knee brace or elastic wrap as told by your health care provider.  Keep all follow-up visits as told by your health care provider. This is important. Contact a health  care provider if:  You have a fever.  Your knee becomes red, tender, or swollen.  Your pain medicine is not helping.  Your symptoms get worse or do not improve after 2 weeks of home care. This information is not intended to replace advice given to you by your health care provider. Make sure you discuss any questions you have with your health care provider. Document Released: 05/02/2002 Document Revised: 07/18/2015 Document Reviewed: 06/04/2014 Elsevier Interactive Patient Education  [employer].

## 2016-10-05 ENCOUNTER — Ambulatory Visit (INDEPENDENT_AMBULATORY_CARE_PROVIDER_SITE_OTHER): Payer: BLUE CROSS/BLUE SHIELD

## 2016-10-05 DIAGNOSIS — M25562 Pain in left knee: Secondary | ICD-10-CM

## 2016-10-05 DIAGNOSIS — M1712 Unilateral primary osteoarthritis, left knee: Secondary | ICD-10-CM | POA: Diagnosis not present

## 2016-10-05 DIAGNOSIS — S8992XA Unspecified injury of left lower leg, initial encounter: Secondary | ICD-10-CM | POA: Diagnosis not present

## 2016-10-07 ENCOUNTER — Ambulatory Visit (INDEPENDENT_AMBULATORY_CARE_PROVIDER_SITE_OTHER): Payer: BLUE CROSS/BLUE SHIELD | Admitting: Family Medicine

## 2016-10-07 ENCOUNTER — Encounter: Payer: Self-pay | Admitting: Family Medicine

## 2016-10-07 VITALS — BP 115/75 | HR 62 | Wt 196.0 lb

## 2016-10-07 DIAGNOSIS — M94262 Chondromalacia, left knee: Secondary | ICD-10-CM | POA: Insufficient documentation

## 2016-10-07 DIAGNOSIS — M2242 Chondromalacia patellae, left knee: Secondary | ICD-10-CM | POA: Diagnosis not present

## 2016-10-07 NOTE — Progress Notes (Signed)
Scott [name] a 51 y.o. male who presents to [facility] today for follow-up left knee pain. Patient experiences left lateral and anterior knee pain now for some time. 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 He was thought to have a meniscus injury and had an MRI in the interim which showed significant grade 4 chondromalacia of the patellofemoral joint with bone marrow edema as well as chondromalacia of the weight-bearing lateral femoral condyle. He plans to finish playing tennis this fall soon and potentially consider surgery if needed.   Past Medical History:  Diagnosis Date  . ADHD (attention deficit hyperactivity disorder)   . Appendicitis 1998  . Asthma    Exercise Induced  . Fracture of wrist 1984  . Fracture, maxillary (HCC) 2009  . Thyroid disease    Past Surgical History:  Procedure Laterality Date  . APPENDECTOMY     Social History  Substance Use Topics  . Smoking status: Never Smoker  . Smokeless tobacco: Never Used  . Alcohol use No     Comment: One drink per week.     ROS:  As above   Medications: Current Outpatient Prescriptions  Medication Sig Dispense Refill  . albuterol (PROAIR HFA) 108 (90 Base) MCG/ACT inhaler Inhale 2 puffs into the lungs every 6 (six) hours as needed for wheezing or shortness of breath. 8.5 Inhaler 11  . clindamycin (CLEOCIN) 300 MG capsule Take 1 capsule (300 mg total) by mouth 3 (three) times daily. 21 capsule 0  . dexmethylphenidate (FOCALIN XR) 20 MG 24 hr capsule     . diazepam (VALIUM) 10 MG tablet TAKE ONE TABLET (10 MG TOTAL) BY MOUTH AT BEDTIME AS NEEDED FOR ANXIETY. 30 tablet 1  . testosterone enanthate (DELATESTRYL) 200 MG/ML injection Inject into the muscle every 14 (fourteen) days. For IM use only    . Thyroid (NATURE-THROID) 113.75 MG TABS Take 112 mcg by mouth.     No current facility-administered medications for this visit.      Allergies  Allergen Reactions  . Naproxen Nausea And Vomiting     Exam:  BP 115/75   Pulse 62   Wt 196 lb (88.9 kg)   BMI 29.80 kg/m  General: Well Developed, well nourished, and in no acute distress.  Neuro/Psych: Alert and oriented x3, extra-ocular muscles intact, able to move all 4 extremities, sensation grossly intact. Skin: Warm and dry, no rashes noted.  Respiratory: Not using accessory muscles, speaking in full sentences, trachea midline.  Cardiovascular: Pulses palpable, no extremity edema. Abdomen: Does not appear distended. MSK:  Left knee mild effusion no erythema. Nontender. Normal motion with retropatellar crepitations. Stable ligamentous exam.  Thigh circumference is 49 cm  Procedure: Real-time Ultrasound Guided Injection of left knee  Device: GE Logiq E  Images permanently stored and available for review in the ultrasound unit. Verbal informed consent obtained. Discussed risks and benefits of procedure. Warned about infection bleeding damage to structures skin hypopigmentation and fat atrophy among others. Patient expresses understanding and agreement Time-out conducted.  Noted no overlying erythema, induration, or other signs of local infection.  Skin prepped in a sterile fashion.  Local anesthesia: Topical Ethyl chloride.  With sterile technique and under real time ultrasound guidance: 80mg  Kenalog and 4 mL of Marcaine injected easily.  Completed without difficulty  Pain immediately resolved suggesting accurate placement of the medication.  Advised to call if fevers/chills, erythema, induration, drainage, or persistent  bleeding.  Images permanently stored and available for review in the ultrasound unit.  Impression: Technically successful ultrasound guided injection.    No results found for this or any previous visit (from the past 48 hour(s)). Mr Knee Left  Wo Contrast  Result Date: 10/05/2016 CLINICAL DATA:  Hyperextension injury left  knee 2 weeks ago playing tennis. Pain inferior to the patella. Initial encounter. EXAM: MRI OF THE LEFT KNEE WITHOUT CONTRAST TECHNIQUE: Multiplanar, multisequence MR imaging of the knee was performed. No intravenous contrast was administered. COMPARISON:  Plain films of the left knee 10/01/2016. FINDINGS: MENISCI Medial meniscus: Fluid is interposed between the joint capsule and medial meniscus at the junction of the posterior horn and body compatible with meniscocapsular cyst separation and tearing of the capsular surface of the meniscus. No abnormal signal reaching an articular surface is identified. Lateral meniscus: There is fraying along the free edge of the body and the body is extruded peripherally. No focal tear. LIGAMENTS Cruciates:  Intact. Collaterals:  Intact. CARTILAGE Patellofemoral: 3-4 full-thickness defects are seen along the lateral femoral trochlea. The largest measures 1.4 cm transverse by 0.6 cm craniocaudal. Cartilage along the lateral patellar facet in the inferior pole is denuded. Medial:  Mildly degenerated. Lateral: Full-thickness defect along the posterior weight-bearing surface of lateral femoral condyle measures 0.5 cm transverse by 1 cm AP. Joint:  Intact. Popliteal Fossa: Small Baker's cyst measures 1 cm transverse by 0.5 cm AP by 2.3 cm craniocaudal. Extensor Mechanism:  Intact. Bones: Small osteophytes are seen about the knee. Marked marrow edema is seen in the lateral femoral trochlea. Milder degree of edema is noted the adjacent lateral patellar facet. Other: None. IMPRESSION: Given history of anterior knee pain, dominant finding is advanced patellofemoral osteoarthritis with multiple fissures in cartilage in the trochlea and a large full-thickness defect in hyaline cartilage of the lateral femoral trochlea and adjacent patellar facet with associated subchondral edema in both bones. Findings compatible with a small capsular surface tear and meniscocapsular separation at the  junction the posterior horn and body of the medial meniscus. Negative for meniscal tear reaching an articular surface. Electronically Signed   By: Drusilla Kannerhomas  Dalessio M.D.   On: 10/05/2016 09:08      Assessment and Plan: 51 y.o. male with left knee pain due to chondromalacia with bone marrow edema. Steroid injection today. Plan to continue tennis when able and refer to orthopedics for evaluation of potential microfracture or OATS procedure in a few months.     Orders Placed This Encounter  Procedures  . AMB referral to orthopedics    Referral Priority:   Routine    Referral Type:   Consultation    Referred to Provider:   Salvatore MarvelWainer, Robert, MD    Number of Visits Requested:   1   No orders of the defined types were placed in this encounter.   Discussed warning signs or symptoms. Please see discharge instructions. Patient expresses understanding.  I spent 25 minutes with this patient, greater than 50% was face-to-face time counseling regarding nature of injury and potential options as well as pros and cons of injection.

## 2016-10-07 NOTE — Patient Instructions (Addendum)
Thank you for coming in today. Resume tennis when feeling better.  Follow up with Orthopedics to consider for microfracture this fall.   You should hear from Dr Thurston HoleWainer soon.   Recheck with me as needed.   We will also arrange for a lateral off loader brace.   You should hear about that soon.

## 2016-10-13 DIAGNOSIS — M25562 Pain in left knee: Secondary | ICD-10-CM | POA: Diagnosis not present

## 2016-10-15 ENCOUNTER — Telehealth: Payer: Self-pay

## 2016-10-15 DIAGNOSIS — E782 Mixed hyperlipidemia: Secondary | ICD-10-CM

## 2016-10-15 NOTE — Telephone Encounter (Signed)
Pt left a VM stating that he is getting a second opinion regarding surgery and the provider is requesting a CMP, CBC, and Lipid panel be ordered for the pt. Pt would like to know is these labs can just be ordered or if he needs a visit. Please advise.

## 2016-10-15 NOTE — Telephone Encounter (Signed)
Left detailed vm advising pt

## 2016-10-15 NOTE — Telephone Encounter (Signed)
Labs ordered. You can get them done fasting in the near future.  Please have the doc send me the consultation note.   Clayburn Pert

## 2016-10-16 DIAGNOSIS — F900 Attention-deficit hyperactivity disorder, predominantly inattentive type: Secondary | ICD-10-CM | POA: Diagnosis not present

## 2016-10-16 DIAGNOSIS — F320 Major depressive disorder, single episode, mild: Secondary | ICD-10-CM | POA: Diagnosis not present

## 2016-10-19 DIAGNOSIS — M1712 Unilateral primary osteoarthritis, left knee: Secondary | ICD-10-CM | POA: Diagnosis not present

## 2016-10-19 DIAGNOSIS — M94262 Chondromalacia, left knee: Secondary | ICD-10-CM | POA: Diagnosis not present

## 2016-10-20 DIAGNOSIS — E782 Mixed hyperlipidemia: Secondary | ICD-10-CM | POA: Diagnosis not present

## 2016-10-20 LAB — CBC
HEMATOCRIT: 47.4 % (ref 38.5–50.0)
Hemoglobin: 15.8 g/dL (ref 13.2–17.1)
MCH: 30.7 pg (ref 27.0–33.0)
MCHC: 33.3 g/dL (ref 32.0–36.0)
MCV: 92 fL (ref 80.0–100.0)
MPV: 9.8 fL (ref 7.5–12.5)
Platelets: 220 10*3/uL (ref 140–400)
RBC: 5.15 MIL/uL (ref 4.20–5.80)
RDW: 14.3 % (ref 11.0–15.0)
WBC: 9.1 10*3/uL (ref 3.8–10.8)

## 2016-10-21 ENCOUNTER — Encounter: Payer: Self-pay | Admitting: Family Medicine

## 2016-10-21 LAB — COMPLETE METABOLIC PANEL WITH GFR
ALT: 20 U/L (ref 9–46)
AST: 20 U/L (ref 10–35)
Albumin: 4.2 g/dL (ref 3.6–5.1)
Alkaline Phosphatase: 65 U/L (ref 40–115)
BILIRUBIN TOTAL: 0.8 mg/dL (ref 0.2–1.2)
BUN: 20 mg/dL (ref 7–25)
CALCIUM: 8.9 mg/dL (ref 8.6–10.3)
CO2: 25 mmol/L (ref 20–32)
Chloride: 103 mmol/L (ref 98–110)
Creat: 1.44 mg/dL — ABNORMAL HIGH (ref 0.70–1.33)
GFR, EST AFRICAN AMERICAN: 64 mL/min (ref >=60)
GFR, EST NON AFRICAN AMERICAN: 55 mL/min — AB (ref >=60)
Glucose, Bld: 98 mg/dL (ref 65–99)
Potassium: 4.5 mmol/L (ref 3.5–5.3)
Sodium: 138 mmol/L (ref 135–146)
Total Protein: 6.4 g/dL (ref 6.1–8.1)

## 2016-10-21 LAB — LIPID PANEL W/REFLEX DIRECT LDL
CHOLESTEROL: 212 mg/dL — AB (ref <200)
HDL: 77 mg/dL (ref >40)
LDL-Cholesterol: 120 mg/dL — ABNORMAL HIGH
NON-HDL CHOLESTEROL (CALC): 135 mg/dL — AB (ref <130)
TRIGLYCERIDES: 60 mg/dL (ref <150)
Total CHOL/HDL Ratio: 2.8 Ratio (ref <5.0)

## 2016-11-16 DIAGNOSIS — F900 Attention-deficit hyperactivity disorder, predominantly inattentive type: Secondary | ICD-10-CM | POA: Diagnosis not present

## 2016-11-16 DIAGNOSIS — F325 Major depressive disorder, single episode, in full remission: Secondary | ICD-10-CM | POA: Diagnosis not present

## 2016-12-01 ENCOUNTER — Other Ambulatory Visit: Payer: Self-pay | Admitting: Family Medicine

## 2016-12-11 ENCOUNTER — Encounter (HOSPITAL_BASED_OUTPATIENT_CLINIC_OR_DEPARTMENT_OTHER): Payer: Self-pay | Admitting: *Deleted

## 2016-12-14 NOTE — H&P (Signed)
Scott [name] a new patient referred and seen at the request of Dr. Farris Has.  Left knee issues.  He has had longstanding symptoms of chondromalacia patellae.  Things were relatively tolerable until he hurt his knee with a hyperextension injury playing tennis in June.  It has never gotten better and is getting steadily worse.  He continues to have a feeling of hyperextension instability.  In talking with him these are all episodes of patellofemoral incongruity that is making the knee feel like it is going to buckle.  Stairs and squatting completely intolerable.  Rest pain and night pain.  He had an injection that helped just a little bit for a very short time.  Although he has had longstanding patellofemoral crepitus, both sides, he really doesn't have a lot of symptoms on the right.  He is otherwise very active with tennis and pickle ball, as well as other activities.  Since his injury he has not been able to do much of anything.  His workup has included x-rays, as well as subsequent MRI.  His x-rays, which I have looked at, show relatively well preserved compartments, including what looks like a not bad patellofemoral joint.  There are no obvious loose bodies.  The medial and lateral compartments actually look fairly good.  His MRI however is much more dramatic.  There are marked changes in the entire patellofemoral joint with large areas of full thickness defect and loss in the lateral trochlea.  Underlying marked marrow edema below that.  There is also a question of a possible meniscocapsular separation or tear posterior horn medial meniscus.  He really doesn't have a lot of symptoms there.  I have looked at all these studies, reports and images.  Discussed that with him at length.  He is miserable and would like to do something to get back to activity.  Based on what he has been told elsewhere he came in today thinking that he was going to have to have a total knee replacement.  His health is otherwise good.  He is  51 years old.   His history, general exam, as well as 12 pages of correspondence have all been reviewed. Again, I have reviewed his x-rays and scans.    EXAMINATION: Height: 5?8.  Weight: 197 pounds.  Specific exam shows an antalgic gait on the left.  Negative straight leg raise, both sides.  Negative log roll, both hips.  The right knee has a normal Q angle.  Stable ligaments.  2-3+ patellofemoral crepitus without pain.  No meniscal signs.  The left knee has profound 4+ crepitus with pain in the patellofemoral joint.  His tracking isn't bad.  His Q angle is not bad.  His ligaments are stable.  He is a little sore medial joint line, but I am not getting a profound McMurray's.     DISPOSITION:  I have had a long extensive talk with Dailyn and his wife, who is with him.  This is not going to get better without treatment.  Given his age and given what I am seeing I really don't think anything is going to help short of a unicompartmental patellofemoral replacement.  Although there is a full thickness defect if I try to treat that with grafting it is not going to be a long-term solution, as there are considerable changes throughout the rest of the patellofemoral joint.  Fortunately his other compartments look good and going to a total knee now is really overkill.  We have discussed all options at  length.  We are going to proceed with exam under anesthesia and arthroscopy, being done primarily to look at his medial meniscus and address that.  I will then proceed with a modified minimally invasive open patellofemoral unicompartmental replacement.  Procedure, risks, benefits and complications reviewed.  He completely understands.  The amount of time for rehab and recovery outlined.  I think this can be done on an outpatient basis.  I will see him at the time of operative intervention

## 2016-12-17 ENCOUNTER — Ambulatory Visit (HOSPITAL_BASED_OUTPATIENT_CLINIC_OR_DEPARTMENT_OTHER): Payer: BLUE CROSS/BLUE SHIELD | Admitting: Anesthesiology

## 2016-12-17 ENCOUNTER — Encounter (HOSPITAL_BASED_OUTPATIENT_CLINIC_OR_DEPARTMENT_OTHER): Payer: Self-pay

## 2016-12-17 ENCOUNTER — Encounter (HOSPITAL_BASED_OUTPATIENT_CLINIC_OR_DEPARTMENT_OTHER)
Admission: RE | Disposition: A | Discharge status: Home / Self Care | Payer: Self-pay | Source: Ambulatory Visit | Attending: Orthopedic Surgery

## 2016-12-17 ENCOUNTER — Ambulatory Visit (HOSPITAL_BASED_OUTPATIENT_CLINIC_OR_DEPARTMENT_OTHER)
Admission: RE | Admit: 2016-12-17 | Discharge: 2016-12-17 | Disposition: A | Discharge status: Home / Self Care | Payer: BLUE CROSS/BLUE SHIELD | Source: Ambulatory Visit | Attending: Orthopedic Surgery | Admitting: Orthopedic Surgery

## 2016-12-17 DIAGNOSIS — S83242A Other tear of medial meniscus, current injury, left knee, initial encounter: Secondary | ICD-10-CM | POA: Diagnosis not present

## 2016-12-17 DIAGNOSIS — S83282A Other tear of lateral meniscus, current injury, left knee, initial encounter: Secondary | ICD-10-CM | POA: Diagnosis not present

## 2016-12-17 DIAGNOSIS — M23201 Derangement of unspecified lateral meniscus due to old tear or injury, left knee: Secondary | ICD-10-CM | POA: Diagnosis not present

## 2016-12-17 DIAGNOSIS — M1712 Unilateral primary osteoarthritis, left knee: Secondary | ICD-10-CM | POA: Insufficient documentation

## 2016-12-17 DIAGNOSIS — G8918 Other acute postprocedural pain: Secondary | ICD-10-CM | POA: Diagnosis not present

## 2016-12-17 DIAGNOSIS — G4730 Sleep apnea, unspecified: Secondary | ICD-10-CM | POA: Insufficient documentation

## 2016-12-17 DIAGNOSIS — Z6831 Body mass index (BMI) 31.0-31.9, adult: Secondary | ICD-10-CM | POA: Insufficient documentation

## 2016-12-17 DIAGNOSIS — M94262 Chondromalacia, left knee: Secondary | ICD-10-CM | POA: Diagnosis not present

## 2016-12-17 DIAGNOSIS — Z79899 Other long term (current) drug therapy: Secondary | ICD-10-CM | POA: Diagnosis not present

## 2016-12-17 DIAGNOSIS — M2242 Chondromalacia patellae, left knee: Secondary | ICD-10-CM | POA: Insufficient documentation

## 2016-12-17 DIAGNOSIS — J45909 Unspecified asthma, uncomplicated: Secondary | ICD-10-CM | POA: Insufficient documentation

## 2016-12-17 DIAGNOSIS — M23301 Other meniscus derangements, unspecified lateral meniscus, left knee: Secondary | ICD-10-CM | POA: Diagnosis not present

## 2016-12-17 DIAGNOSIS — E039 Hypothyroidism, unspecified: Secondary | ICD-10-CM | POA: Insufficient documentation

## 2016-12-17 DIAGNOSIS — M2342 Loose body in knee, left knee: Secondary | ICD-10-CM | POA: Insufficient documentation

## 2016-12-17 HISTORY — DX: Unspecified staphylococcus as the cause of diseases classified elsewhere: B95.8

## 2016-12-17 HISTORY — PX: CHONDROPLASTY: SHX5177

## 2016-12-17 HISTORY — PX: KNEE ARTHROSCOPY: SHX127

## 2016-12-17 HISTORY — DX: Other specified postprocedural states: Z98.890

## 2016-12-17 HISTORY — DX: Nausea with vomiting, unspecified: R11.2

## 2016-12-17 SURGERY — ARTHROSCOPY, KNEE
Anesthesia: Regional | Site: Knee | Laterality: Right

## 2016-12-17 MED ORDER — CEFAZOLIN SODIUM-DEXTROSE 2-4 GM/100ML-% IV SOLN: 2.0000 g | INTRAVENOUS | Status: DC

## 2016-12-17 MED ORDER — BUPIVACAINE-EPINEPHRINE (PF) 0.5% -1:200000 IJ SOLN
INTRAMUSCULAR | Status: DC | PRN
  Administered 2016-12-17: 30 mL via PERINEURAL

## 2016-12-17 MED ORDER — FENTANYL CITRATE (PF) 100 MCG/2ML IJ SOLN
50.0000 ug | INTRAMUSCULAR | Status: DC | PRN
  Administered 2016-12-17: 50 ug via INTRAVENOUS

## 2016-12-17 MED ORDER — ONDANSETRON HCL 4 MG/2ML IJ SOLN
INTRAMUSCULAR | Status: AC
Start: 2016-12-17 — End: ?
  Filled 2016-12-17: qty 2

## 2016-12-17 MED ORDER — LACTATED RINGERS IV SOLN: INTRAVENOUS | Status: DC

## 2016-12-17 MED ORDER — MIDAZOLAM HCL 2 MG/2ML IJ SOLN
INTRAMUSCULAR | Status: AC
  Filled 2016-12-17: qty 2

## 2016-12-17 MED ORDER — OXYCODONE HCL 5 MG PO TABS: 5.0000 mg | ORAL_TABLET | Freq: Once | ORAL | Status: DC | PRN

## 2016-12-17 MED ORDER — HYDROCODONE-ACETAMINOPHEN 5-325 MG PO TABS: 1.0000 | ORAL_TABLET | ORAL | 0 refills | Status: DC | PRN

## 2016-12-17 MED ORDER — ONDANSETRON HCL 4 MG PO TABS: 4.0000 mg | ORAL_TABLET | Freq: Three times a day (TID) | ORAL | 0 refills | Status: DC | PRN

## 2016-12-17 MED ORDER — SCOPOLAMINE 1 MG/3DAYS TD PT72
MEDICATED_PATCH | TRANSDERMAL | Status: AC
  Filled 2016-12-17: qty 1

## 2016-12-17 MED ORDER — SCOPOLAMINE 1 MG/3DAYS TD PT72
1.0000 | MEDICATED_PATCH | Freq: Once | TRANSDERMAL | Status: DC | PRN
  Administered 2016-12-17: 1.5 mg via TRANSDERMAL

## 2016-12-17 MED ORDER — OXYCODONE HCL 5 MG/5ML PO SOLN: 5.0000 mg | Freq: Once | ORAL | Status: DC | PRN

## 2016-12-17 MED ORDER — CHLORHEXIDINE GLUCONATE 4 % EX LIQD
60.0000 mL | Freq: Once | CUTANEOUS | Status: DC
Start: 2016-12-17 — End: 2016-12-17

## 2016-12-17 MED ORDER — PROPOFOL 500 MG/50ML IV EMUL
INTRAVENOUS | Status: DC | PRN
  Administered 2016-12-17: 125 ug/kg/min via INTRAVENOUS

## 2016-12-17 MED ORDER — METHYLPREDNISOLONE ACETATE 80 MG/ML IJ SUSP
INTRAMUSCULAR | Status: AC
  Filled 2016-12-17: qty 1

## 2016-12-17 MED ORDER — MIDAZOLAM HCL 2 MG/2ML IJ SOLN
1.0000 mg | INTRAMUSCULAR | Status: DC | PRN
  Administered 2016-12-17 (×2): 2 mg via INTRAVENOUS

## 2016-12-17 MED ORDER — DEXAMETHASONE SODIUM PHOSPHATE 10 MG/ML IJ SOLN
INTRAMUSCULAR | Status: AC
Start: 2016-12-17 — End: ?
  Filled 2016-12-17: qty 1

## 2016-12-17 MED ORDER — SODIUM CHLORIDE 0.9 % IR SOLN
Status: DC | PRN
  Administered 2016-12-17: 6000 mL

## 2016-12-17 MED ORDER — BUPIVACAINE HCL (PF) 0.25 % IJ SOLN
INTRAMUSCULAR | Status: AC
  Filled 2016-12-17: qty 30

## 2016-12-17 MED ORDER — BUPIVACAINE HCL (PF) 0.5 % IJ SOLN
INTRAMUSCULAR | Status: AC
  Filled 2016-12-17: qty 30

## 2016-12-17 MED ORDER — FENTANYL CITRATE (PF) 100 MCG/2ML IJ SOLN
INTRAMUSCULAR | Status: AC
  Filled 2016-12-17: qty 2

## 2016-12-17 MED ORDER — LIDOCAINE HCL (CARDIAC) 20 MG/ML IV SOLN
INTRAVENOUS | Status: DC | PRN
  Administered 2016-12-17: 70 mg via INTRAVENOUS

## 2016-12-17 MED ORDER — PROPOFOL 500 MG/50ML IV EMUL
INTRAVENOUS | Status: AC
  Filled 2016-12-17: qty 250

## 2016-12-17 MED ORDER — DEXAMETHASONE SODIUM PHOSPHATE 10 MG/ML IJ SOLN
INTRAMUSCULAR | Status: DC | PRN
  Administered 2016-12-17: 10 mg via INTRAVENOUS

## 2016-12-17 MED ORDER — PROPOFOL 500 MG/50ML IV EMUL
INTRAVENOUS | Status: AC
Start: 2016-12-17 — End: ?
  Filled 2016-12-17: qty 50

## 2016-12-17 MED ORDER — ONDANSETRON HCL 4 MG/2ML IJ SOLN
INTRAMUSCULAR | Status: DC | PRN
  Administered 2016-12-17: 4 mg via INTRAVENOUS

## 2016-12-17 MED ORDER — LACTATED RINGERS IV SOLN
INTRAVENOUS | Status: DC
  Administered 2016-12-17 (×2): via INTRAVENOUS

## 2016-12-17 MED ORDER — VANCOMYCIN HCL IN DEXTROSE 1-5 GM/200ML-% IV SOLN
1000.0000 mg | Freq: Once | INTRAVENOUS | Status: AC
  Administered 2016-12-17: 1000 mg via INTRAVENOUS

## 2016-12-17 MED ORDER — PROPOFOL 10 MG/ML IV BOLUS
INTRAVENOUS | Status: DC | PRN
  Administered 2016-12-17: 20 mg via INTRAVENOUS
  Administered 2016-12-17: 100 mg via INTRAVENOUS
  Administered 2016-12-17: 180 mg via INTRAVENOUS

## 2016-12-17 MED ORDER — ACETAMINOPHEN 325 MG PO TABS: 325.0000 mg | ORAL_TABLET | ORAL | Status: DC | PRN

## 2016-12-17 MED ORDER — VANCOMYCIN HCL IN DEXTROSE 1-5 GM/200ML-% IV SOLN
INTRAVENOUS | Status: AC
  Filled 2016-12-17: qty 200

## 2016-12-17 MED ORDER — LIDOCAINE 2% (20 MG/ML) 5 ML SYRINGE
INTRAMUSCULAR | Status: AC
Start: 2016-12-17 — End: ?
  Filled 2016-12-17: qty 5

## 2016-12-17 MED ORDER — ACETAMINOPHEN 160 MG/5ML PO SOLN: 325.0000 mg | ORAL | Status: DC | PRN

## 2016-12-17 MED ORDER — HYDROMORPHONE HCL 1 MG/ML IJ SOLN: 0.2500 mg | INTRAMUSCULAR | Status: DC | PRN

## 2016-12-17 MED ORDER — CHLORHEXIDINE GLUCONATE 4 % EX LIQD: 60.0000 mL | Freq: Once | CUTANEOUS | Status: DC

## 2016-12-17 SURGICAL SUPPLY — 82 items
APL SKNCLS STERI-STRIP NONHPOA (GAUZE/BANDAGES/DRESSINGS)
BAG DECANTER FOR FLEXI CONT (MISCELLANEOUS) ×1 IMPLANT
BANDAGE ACE 4X5 VEL STRL LF (GAUZE/BANDAGES/DRESSINGS) ×4 IMPLANT
BANDAGE ACE 6X5 VEL STRL LF (GAUZE/BANDAGES/DRESSINGS) ×4 IMPLANT
BANDAGE ESMARK 6X9 LF (GAUZE/BANDAGES/DRESSINGS) ×2 IMPLANT
BENZOIN TINCTURE PRP APPL 2/3 (GAUZE/BANDAGES/DRESSINGS) ×1 IMPLANT
BLADE SAG 18X100X1.27 (BLADE) ×4 IMPLANT
BLADE SAW SGTL 13.0X1.19X90.0M (BLADE) IMPLANT
BLADE SURG 15 STRL LF DISP TIS (BLADE) ×4 IMPLANT
BLADE SURG 15 STRL SS (BLADE) ×8
BNDG CMPR 9X6 STRL LF SNTH (GAUZE/BANDAGES/DRESSINGS) ×2
BNDG ESMARK 6X9 LF (GAUZE/BANDAGES/DRESSINGS) ×4
BOWL SMART MIX CTS (DISPOSABLE) ×1 IMPLANT
BUR SURG 4X8 MED (BURR) IMPLANT
BURR SURG 4MMX8MM MEDIUM (BURR)
BURR SURG 4X8 MED (BURR)
CLOSURE WOUND 1/2 X4 (GAUZE/BANDAGES/DRESSINGS) ×1
COVER BACK TABLE 60X90IN (DRAPES) ×4 IMPLANT
CUFF TOURNIQUET SINGLE 34IN LL (TOURNIQUET CUFF) ×3 IMPLANT
DECANTER SPIKE VIAL GLASS SM (MISCELLANEOUS) ×1 IMPLANT
DRAPE ARTHROSCOPY W/POUCH 114 (DRAPES) ×3 IMPLANT
DRAPE EXTREMITY T 121X128X90 (DRAPE) ×1 IMPLANT
DRAPE IMP U-DRAPE 54X76 (DRAPES) ×3 IMPLANT
DRAPE INCISE IOBAN 66X45 STRL (DRAPES) ×4 IMPLANT
DRAPE U-SHAPE 47X51 STRL (DRAPES) ×4 IMPLANT
DRSG MEPILEX BORDER 4X8 (GAUZE/BANDAGES/DRESSINGS) ×1 IMPLANT
DURAPREP 26ML APPLICATOR (WOUND CARE) ×4 IMPLANT
ELECT REM PT RETURN 9FT ADLT (ELECTROSURGICAL) ×4
ELECTRODE REM PT RTRN 9FT ADLT (ELECTROSURGICAL) ×2 IMPLANT
GAUZE XEROFORM 5X9 LF (GAUZE/BANDAGES/DRESSINGS) ×4 IMPLANT
GLOVE BIO SURGEON STRL SZ 6.5 (GLOVE) ×4 IMPLANT
GLOVE BIO SURGEONS STRL SZ 6.5 (GLOVE) ×2
GLOVE BIOGEL PI IND STRL 7.0 (GLOVE) ×4 IMPLANT
GLOVE BIOGEL PI IND STRL 8 (GLOVE) ×1 IMPLANT
GLOVE BIOGEL PI INDICATOR 7.0 (GLOVE) ×6
GLOVE BIOGEL PI INDICATOR 8 (GLOVE) ×2
GLOVE ECLIPSE 7.0 STRL STRAW (GLOVE) ×4 IMPLANT
GLOVE SURG ORTHO 8.0 STRL STRW (GLOVE) ×4 IMPLANT
GOWN STRL REUS W/ TWL LRG LVL3 (GOWN DISPOSABLE) ×2 IMPLANT
GOWN STRL REUS W/ TWL XL LVL3 (GOWN DISPOSABLE) ×5 IMPLANT
GOWN STRL REUS W/TWL LRG LVL3 (GOWN DISPOSABLE) ×4
GOWN STRL REUS W/TWL XL LVL3 (GOWN DISPOSABLE) ×12
HANDPIECE INTERPULSE COAX TIP (DISPOSABLE) ×4
Hemicap Resurfacing system, 11 Taper Post (Post) IMPLANT
IMMOBILIZER KNEE 22 UNIV (SOFTGOODS) ×1 IMPLANT
IMMOBILIZER KNEE 24 THIGH 36 (MISCELLANEOUS) ×2 IMPLANT
IMMOBILIZER KNEE 24 UNIV (MISCELLANEOUS) ×4
KNEE WRAP E Z 3 GEL PACK (MISCELLANEOUS) ×4 IMPLANT
NDL SAFETY ECLIPSE 18X1.5 (NEEDLE) ×3 IMPLANT
NEEDLE HYPO 18GX1.5 SHARP (NEEDLE) ×4
NS IRRIG 1000ML POUR BTL (IV SOLUTION) ×4 IMPLANT
PACK ARTHROSCOPY DSU (CUSTOM PROCEDURE TRAY) ×4 IMPLANT
PACK BASIN DAY SURGERY FS (CUSTOM PROCEDURE TRAY) ×4 IMPLANT
PACK UNIVERSAL I (CUSTOM PROCEDURE TRAY) ×1 IMPLANT
PAD CAST 4YDX4 CTTN HI CHSV (CAST SUPPLIES) IMPLANT
PADDING CAST COTTON 4X4 STRL (CAST SUPPLIES)
PADDING CAST COTTON 6X4 STRL (CAST SUPPLIES) IMPLANT
PENCIL BUTTON HOLSTER BLD 10FT (ELECTRODE) ×4 IMPLANT
SET HNDPC FAN SPRY TIP SCT (DISPOSABLE) ×2 IMPLANT
SLEEVE SCD COMPRESS KNEE MED (MISCELLANEOUS) IMPLANT
SPONGE LAP 18X18 X RAY DECT (DISPOSABLE) ×4 IMPLANT
STAPLER VISISTAT 35W (STAPLE) IMPLANT
STRIP CLOSURE SKIN 1/2X4 (GAUZE/BANDAGES/DRESSINGS) ×3 IMPLANT
SUCTION FRAZIER HANDLE 10FR (MISCELLANEOUS)
SUCTION TUBE FRAZIER 10FR DISP (MISCELLANEOUS) ×1 IMPLANT
SUT FIBERWIRE #2 38 T-5 BLUE (SUTURE)
SUT MNCRL AB 4-0 PS2 18 (SUTURE) ×1 IMPLANT
SUT VIC AB 0 CT1 27 (SUTURE)
SUT VIC AB 0 CT1 27XBRD ANBCTR (SUTURE) IMPLANT
SUT VIC AB 0 SH 27 (SUTURE) IMPLANT
SUT VIC AB 1 CT1 27 (SUTURE)
SUT VIC AB 1 CT1 27XBRD ANBCTR (SUTURE) IMPLANT
SUT VIC AB 2-0 SH 27 (SUTURE) ×4
SUT VIC AB 2-0 SH 27XBRD (SUTURE) ×2 IMPLANT
SUTURE FIBERWR #2 38 T-5 BLUE (SUTURE) IMPLANT
SYR 50ML LL SCALE MARK (SYRINGE) ×1 IMPLANT
SYR BULB 3OZ (MISCELLANEOUS) ×4 IMPLANT
TOWEL OR 17X24 6PK STRL BLUE (TOWEL DISPOSABLE) ×4 IMPLANT
TOWEL OR NON WOVEN STRL DISP B (DISPOSABLE) ×8 IMPLANT
TUBE CONNECTING 20'X1/4 (TUBING)
TUBE CONNECTING 20X1/4 (TUBING) ×1 IMPLANT
YANKAUER SUCT BULB TIP NO VENT (SUCTIONS) ×4 IMPLANT

## 2016-12-17 NOTE — Anesthesia Preprocedure Evaluation (Signed)
Anesthesia Evaluation  Patient identified by MRN, date of birth, ID band Patient awake    Reviewed: Allergy & Precautions, NPO status , Patient's Chart, lab work & pertinent test results  History of Anesthesia Complications (+) PONV and history of anesthetic complications  Airway Mallampati: II  TM Distance: >3 FB Neck ROM: Full    Dental  (+) Teeth Intact   Pulmonary asthma , sleep apnea ,    breath sounds clear to auscultation       Cardiovascular negative cardio ROS   Rhythm:Regular     Neuro/Psych  Headaches,    GI/Hepatic negative GI ROS, Neg liver ROS,   Endo/Other  Hypothyroidism Morbid obesity  Renal/GU negative Renal ROS     Musculoskeletal  LEFT KNEE MEDIAL MENISCUS TEAR, CHONDROMALACIA PATELLAE, OSTEOARTHRITIS    Abdominal   Peds  Hematology negative hematology ROS (+)   Anesthesia Other Findings   Reproductive/Obstetrics                             Anesthesia Physical Anesthesia Plan  ASA: II  Anesthesia Plan: General and Regional   Post-op Pain Management:    Induction: Intravenous  PONV Risk Score and Plan: 3 and Ondansetron, Dexamethasone, Midazolam and Propofol infusion  Airway Management Planned: LMA  Additional Equipment: None  Intra-op Plan:   Post-operative Plan: Extubation in OR  Informed Consent: I have reviewed the patients History and Physical, chart, labs and discussed the procedure including the risks, benefits and alternatives for the proposed anesthesia with the patient or authorized representative who has indicated his/her understanding and acceptance.   Dental advisory given  Plan Discussed with: CRNA and Surgeon  Anesthesia Plan Comments: (TIVA with propofol gtt and FNB)        Anesthesia Quick Evaluation

## 2016-12-17 NOTE — Discharge Instructions (Signed)
Discharge Instructions after Knee Arthroscopy  MUST WEAR KNEE IMMOBILIZER WHEN WALKING UNTIL BLOCK HAS WORN OFF  You will have a light dressing on your knee.  Leave the dressing in place until the third day after your surgery and then remove it and place a band-aid over the stitches.  After the bandage has been removed you may shower, but do not soak the incision. You may begin gentle motion of your leg immediately after surgery. Pump your foot up and down 20 times per hour, every hour you are awake.  Apply ice to the knee 3 times per day for 30 minutes for the first 1 week until your knee is feeling comfortable again. Do not use heat.  You may begin straight leg raising exercises (if you have a brace with it on). While lying down, pull your foot all the way up, tighten your quadriceps muscle and lift your heel off of the ground. Hold this position for 2 seconds, and then let the leg back down. Repeat the exercise 10 times, at least 3 times a day.  Pain medicine has been prescribed for you.  Use your medicine as needed over the first 48 hours, and then you can begin to taper your use. You may take Extra Strength Tylenol or Tylenol only in place of the pain pills.    Please call [phone] for any problems. Including the following:  - excessive redness of the incisions - drainage for more than 4 days - fever of more than 101.5 F  *Please note that pain medications will not be refilled after hours or on weekends.       Post Anesthesia Home Care Instructions  Activity: Get plenty of rest for the remainder of the day. A responsible individual must stay with you for 24 hours following the procedure.  For the next 24 hours, DO NOT: -Drive a car -Advertising copywriter -Drink alcoholic beverages -Take any medication unless instructed by your physician -Make any legal decisions or sign important papers.  Meals: Start with liquid foods such as gelatin or soup. Progress to regular foods as  tolerated. Avoid greasy, spicy, heavy foods. If nausea and/or vomiting occur, drink only clear liquids until the nausea and/or vomiting subsides. Call your physician if vomiting continues.  Special Instructions/Symptoms: Your throat may feel dry or sore from the anesthesia or the breathing tube placed in your throat during surgery. If this causes discomfort, gargle with warm salt water. The discomfort should disappear within 24 hours.  If you had a scopolamine patch placed behind your ear for the management of post- operative nausea and/or vomiting:  1. The medication in the patch is effective for 72 hours, after which it should be removed.  Wrap patch in a tissue and discard in the trash. Wash hands thoroughly with soap and water. 2. You may remove the patch earlier than 72 hours if you experience unpleasant side effects which may include dry mouth, dizziness or visual disturbances. 3. Avoid touching the patch. Wash your hands with soap and water after contact with the patch.        Regional Anesthesia Blocks  1. Numbness or the inability to move the "blocked" extremity may last from 3-48 hours after placement. The length of time depends on the medication injected and your individual response to the medication. If the numbness is not going away after 48 hours, call your surgeon.  2. The extremity that is blocked will need to be protected until the numbness is gone and the  Strength has returned. Because you cannot feel it, you will need to take extra care to avoid injury. Because it may be weak, you may have difficulty moving it or using it. You may not know what position it is in without looking at it while the block is in effect.  3. For blocks in the legs and feet, returning to weight bearing and walking needs to be done carefully. You will need to wait until the numbness is entirely gone and the strength has returned. You should be able to move your leg and foot normally before you try and  bear weight or walk. You will need someone to be with you when you first try to ensure you do not fall and possibly risk injury.  4. Bruising and tenderness at the needle site are common side effects and will resolve in a few days.  5. Persistent numbness or new problems with movement should be communicated to the surgeon or the [facility] [phone])/ [facility] [phone]).

## 2016-12-17 NOTE — Interval H&P Note (Signed)
History and Physical Interval Note:  12/17/2016 7:46 AM  Scott Fields  has presented today for surgery, with the diagnosis of LEFT KNEE MEDIAL MENISCUS TEAR, CHONDROMALACIA PATELLAE, OSTEOARTHRITIS S83.249A  The various methods of treatment have been discussed with the patient and family. After consideration of risks, benefits and other options for treatment, the patient has consented to  Procedure(s): KNEE ARTHROSCOPY WITH MEDIAL MENISECTOMY, CONDROPLASTY, PATELLOFEMORAL REPLACEMENT (Left) as a surgical intervention .  The patient's history has been reviewed, patient examined, no change in status, stable for surgery.  I have reviewed the patient's chart and labs.  Questions were answered to the patient's satisfaction.     Loreta Aveaniel F Murphy

## 2016-12-17 NOTE — Progress Notes (Signed)
Assisted Dr. Maple HudsonMoser with left, ultrasound guided, femoral block. Side rails up, monitors on throughout procedure. See vital signs in flow sheet. Tolerated Procedure well.

## 2016-12-17 NOTE — Anesthesia Procedure Notes (Signed)
Procedure Name: LMA Insertion Date/Time: 12/17/2016 8:23 AM Performed by: Burna CashONRAD, Karlin Binion C Pre-anesthesia Checklist: Patient identified, Emergency Drugs available, Suction available and Patient being monitored Patient Re-evaluated:Patient Re-evaluated prior to induction Oxygen Delivery Method: Circle system utilized Preoxygenation: Pre-oxygenation with 100% oxygen Induction Type: IV induction Ventilation: Mask ventilation without difficulty LMA: LMA inserted LMA Size: 5.0 Number of attempts: 1 Airway Equipment and Method: Bite block Placement Confirmation: positive ETCO2 Tube secured with: Tape Dental Injury: Teeth and Oropharynx as per pre-operative assessment

## 2016-12-17 NOTE — Transfer of Care (Signed)
Immediate Anesthesia Transfer of Care Note  Patient: Scott Fields  Procedure(s) Performed: KNEE ARTHROSCOPY, REMOVAL OF LOOSE BODIES (Right Knee) CHONDROPLASTY (Right Knee)  Patient Location: PACU  Anesthesia Type:GA combined with regional for post-op pain  Level of Consciousness: sedated  Airway & Oxygen Therapy: Patient Spontanous Breathing and Patient connected to face mask oxygen  Post-op Assessment: Report given to RN and Post -op Vital signs reviewed and stable  Post vital signs: Reviewed and stable  Last Vitals:  Vitals:   12/17/16 0745 12/17/16 0912  BP: 107/67   Pulse: 69   Resp: (!) 8   Temp:  (P) 36.5 C  SpO2: 100%     Last Pain:  Vitals:   12/17/16 0912  TempSrc:   PainSc: (P) Asleep         Complications: No apparent anesthesia complications

## 2016-12-17 NOTE — Anesthesia Procedure Notes (Signed)
Anesthesia Regional Block: Femoral nerve block   Pre-Anesthetic Checklist: ,, timeout performed, Correct Patient, Correct Site, Correct Laterality, Correct Procedure, Correct Position, site marked, Risks and benefits discussed,  Surgical consent,  Pre-op evaluation,  At surgeon's request and post-op pain management  Laterality: Lower and Left  Prep: chloraprep       Needles:  Injection technique: Single-shot  Needle Type: Echogenic Stimulator Needle          Additional Needles:   Procedures:,,,, ultrasound used (permanent image in chart),,,,  Narrative:  Start time: 12/17/2016 7:32 AM End time: 12/17/2016 7:36 AM Injection made incrementally with aspirations every 5 mL.  Performed by: Personally  Anesthesiologist: Brenon Antosh  Additional Notes: H+P and labs reviewed, risks and benefits discussed with patient, procedure tolerated well without complications

## 2016-12-18 DIAGNOSIS — S83282D Other tear of lateral meniscus, current injury, left knee, subsequent encounter: Secondary | ICD-10-CM | POA: Diagnosis not present

## 2016-12-18 DIAGNOSIS — S83242D Other tear of medial meniscus, current injury, left knee, subsequent encounter: Secondary | ICD-10-CM | POA: Diagnosis not present

## 2016-12-18 NOTE — Anesthesia Postprocedure Evaluation (Signed)
Anesthesia Post Note  Patient: Scott ArtisMark Fields  Procedure(s) Performed: KNEE ARTHROSCOPY, REMOVAL OF LOOSE BODIES (Right Knee) CHONDROPLASTY (Right Knee)     Patient location during evaluation: PACU Anesthesia Type: Regional and General Level of consciousness: awake and alert Pain management: pain level controlled Vital Signs Assessment: post-procedure vital signs reviewed and stable Respiratory status: spontaneous breathing, nonlabored ventilation, respiratory function stable and patient connected to nasal cannula oxygen Cardiovascular status: blood pressure returned to baseline and stable Postop Assessment: no apparent nausea or vomiting Anesthetic complications: no    Last Vitals:  Vitals:   12/17/16 0945 12/17/16 1009  BP: 123/78 120/71  Pulse: 71 65  Resp: 13 18  Temp:  36.5 C  SpO2: 98% 100%    Last Pain:  Vitals:   12/17/16 0935  TempSrc:   PainSc: 3    Pain Goal: Patients Stated Pain Goal: 3 (12/17/16 0935)               Durante Violett

## 2016-12-18 NOTE — Op Note (Signed)
Scott Fields:  [name], [name]                ACCOUNT NO.:  [id number]  MEDICAL RECORD NO.:  [id number]  LOCATION:                                 FACILITY:  PHYSICIAN:  Loreta Aveaniel F. Donnavin Vandenbrink, M.D.      DATE OF BIRTH:  DATE OF PROCEDURE:  12/17/2016 DATE OF DISCHARGE:                              OPERATIVE REPORT   PREOPERATIVE DIAGNOSES:  Left knee question peripheral medial meniscus tear.  Degenerative tearing, lateral meniscus.  Extensive grade 4 changes patellofemoral joint.  POSTOPERATIVE DIAGNOSES:  Left knee question peripheral medial meniscus tear.  Degenerative tearing, lateral meniscus.  Extensive grade 4 changes patellofemoral joint.  Also with extensive changes in the lateral compartment with numerous chondral loose bodies.  No medial meniscus tear.  PROCEDURE:  Left knee exam under anesthesia, arthroscopy.  Removal of loose bodies.  Chondroplasty throughout.  Debride lateral meniscus. Based on findings, an unicompartmental replacement was not performed.  SURGEON:  Loreta Aveaniel F. Tashayla Therien, M.D.  ASSISTANT:  Tessa LernerLindsey Stanbery, PA.  ANESTHESIA:  General.  BLOOD LOSS:  Minimal.  SPECIMENS:  None.  CULTURES:  None.  COMPLICATIONS:  None.  DRESSINGS:  Sterile compressive.  TOURNIQUET:  Not employed.  DESCRIPTION OF PROCEDURE:  The patient was brought to the operating room and after adequate anesthesia had been obtained, tourniquet applied. Prepped and draped in usual sterile fashion.  Proceeded with arthroscopy.  Before doing any incision for a planned partial knee replacement.  Two portals; one each medial and lateral parapatellar. Arthroscope introduced, used, and inspected.  Numerous chondral loose bodies throughout.  Emanating from grade 4 changes of the entire patellofemoral joint and unfortunately, the entire weightbearing dome, lateral femoral condyle.  Loose bodies removed.  Chondroplasty of loose fragments.  Degenerative tearing lateral meniscus debrided all way around.   Medial compartment did not look bad and there were no meniscal tears there.  Cruciate ligaments intact.  After looking at everything, instruments were fully removed.  Obviously with the degree of changes, a partial knee replacement was not an option and he is going out to be scheduled for total knee replacement.  At completion, knee injected with Depo-Medrol.  Portals were closed with nylon.  Sterile compressive dressing applied. Anesthesia reversed.  Brought to the recovery room.  Tolerated the surgery well.  No complications.     Loreta Aveaniel F. Evianna Chandran, M.D.   ______________________________ Loreta Aveaniel F. Oriel Rumbold, M.D.    DFM/MEDQ  D:  12/17/2016  T:  12/17/2016  Job:  [id number]

## 2016-12-22 ENCOUNTER — Encounter (HOSPITAL_BASED_OUTPATIENT_CLINIC_OR_DEPARTMENT_OTHER): Payer: Self-pay | Admitting: Orthopedic Surgery

## 2016-12-25 DIAGNOSIS — S83242D Other tear of medial meniscus, current injury, left knee, subsequent encounter: Secondary | ICD-10-CM | POA: Diagnosis not present

## 2017-01-01 DIAGNOSIS — F320 Major depressive disorder, single episode, mild: Secondary | ICD-10-CM | POA: Diagnosis not present

## 2017-01-01 DIAGNOSIS — F900 Attention-deficit hyperactivity disorder, predominantly inattentive type: Secondary | ICD-10-CM | POA: Diagnosis not present

## 2017-01-12 DIAGNOSIS — X32XXXA Exposure to sunlight, initial encounter: Secondary | ICD-10-CM | POA: Diagnosis not present

## 2017-01-12 DIAGNOSIS — L708 Other acne: Secondary | ICD-10-CM | POA: Diagnosis not present

## 2017-01-12 DIAGNOSIS — L570 Actinic keratosis: Secondary | ICD-10-CM | POA: Diagnosis not present

## 2017-01-12 DIAGNOSIS — L720 Epidermal cyst: Secondary | ICD-10-CM | POA: Diagnosis not present

## 2017-01-22 DIAGNOSIS — S83242D Other tear of medial meniscus, current injury, left knee, subsequent encounter: Secondary | ICD-10-CM | POA: Diagnosis not present

## 2017-01-25 DIAGNOSIS — Z9889 Other specified postprocedural states: Secondary | ICD-10-CM | POA: Diagnosis not present

## 2017-01-25 DIAGNOSIS — M1712 Unilateral primary osteoarthritis, left knee: Secondary | ICD-10-CM | POA: Diagnosis not present

## 2017-01-25 DIAGNOSIS — S76112D Strain of left quadriceps muscle, fascia and tendon, subsequent encounter: Secondary | ICD-10-CM | POA: Diagnosis not present

## 2017-01-29 DIAGNOSIS — F320 Major depressive disorder, single episode, mild: Secondary | ICD-10-CM | POA: Diagnosis not present

## 2017-01-29 DIAGNOSIS — F900 Attention-deficit hyperactivity disorder, predominantly inattentive type: Secondary | ICD-10-CM | POA: Diagnosis not present

## 2017-02-04 DIAGNOSIS — R5383 Other fatigue: Secondary | ICD-10-CM | POA: Diagnosis not present

## 2017-02-04 DIAGNOSIS — E291 Testicular hypofunction: Secondary | ICD-10-CM | POA: Diagnosis not present

## 2017-02-04 DIAGNOSIS — E039 Hypothyroidism, unspecified: Secondary | ICD-10-CM | POA: Diagnosis not present

## 2017-02-04 DIAGNOSIS — R7303 Prediabetes: Secondary | ICD-10-CM | POA: Diagnosis not present

## 2017-02-26 DIAGNOSIS — R7303 Prediabetes: Secondary | ICD-10-CM | POA: Diagnosis not present

## 2017-02-26 DIAGNOSIS — E039 Hypothyroidism, unspecified: Secondary | ICD-10-CM | POA: Diagnosis not present

## 2017-02-26 DIAGNOSIS — F329 Major depressive disorder, single episode, unspecified: Secondary | ICD-10-CM | POA: Diagnosis not present

## 2017-02-26 DIAGNOSIS — E7212 Methylenetetrahydrofolate reductase deficiency: Secondary | ICD-10-CM | POA: Diagnosis not present

## 2017-02-27 DIAGNOSIS — Z886 Allergy status to analgesic agent status: Secondary | ICD-10-CM | POA: Diagnosis not present

## 2017-02-27 DIAGNOSIS — R110 Nausea: Secondary | ICD-10-CM | POA: Diagnosis not present

## 2017-02-27 DIAGNOSIS — Z79899 Other long term (current) drug therapy: Secondary | ICD-10-CM | POA: Diagnosis not present

## 2017-02-27 DIAGNOSIS — R51 Headache: Secondary | ICD-10-CM | POA: Diagnosis not present

## 2017-02-27 DIAGNOSIS — E039 Hypothyroidism, unspecified: Secondary | ICD-10-CM | POA: Diagnosis not present

## 2017-02-27 DIAGNOSIS — F909 Attention-deficit hyperactivity disorder, unspecified type: Secondary | ICD-10-CM | POA: Diagnosis not present

## 2017-03-01 DIAGNOSIS — F320 Major depressive disorder, single episode, mild: Secondary | ICD-10-CM | POA: Diagnosis not present

## 2017-03-01 DIAGNOSIS — F900 Attention-deficit hyperactivity disorder, predominantly inattentive type: Secondary | ICD-10-CM | POA: Diagnosis not present

## 2017-03-03 ENCOUNTER — Encounter: Payer: Self-pay | Admitting: Physician Assistant

## 2017-03-03 ENCOUNTER — Ambulatory Visit (INDEPENDENT_AMBULATORY_CARE_PROVIDER_SITE_OTHER): Payer: BLUE CROSS/BLUE SHIELD | Admitting: Physician Assistant

## 2017-03-03 VITALS — BP 124/84 | HR 67 | Temp 98.2°F | Wt 221.0 lb

## 2017-03-03 DIAGNOSIS — J019 Acute sinusitis, unspecified: Secondary | ICD-10-CM

## 2017-03-03 DIAGNOSIS — G43009 Migraine without aura, not intractable, without status migrainosus: Secondary | ICD-10-CM | POA: Diagnosis not present

## 2017-03-03 DIAGNOSIS — B9689 Other specified bacterial agents as the cause of diseases classified elsewhere: Secondary | ICD-10-CM | POA: Diagnosis not present

## 2017-03-03 MED ORDER — CEFUROXIME AXETIL 250 MG PO TABS: 250.0000 mg | ORAL_TABLET | Freq: Two times a day (BID) | ORAL | 0 refills | Status: AC

## 2017-03-03 MED ORDER — PROMETHAZINE HCL 25 MG PO TABS: 12.5000 mg | ORAL_TABLET | Freq: Four times a day (QID) | ORAL | 0 refills | Status: DC | PRN

## 2017-03-03 NOTE — Progress Notes (Signed)
HPI:                                                                Scott [name] a 52 y.o. male who presents to [name] today for sinus congestion  Sinusitis  This is a new problem. Episode onset: x 10 days. The problem is unchanged. Maximum temperature: "low-grade" The pain is mild. Associated symptoms include chills, congestion, headaches and sinus pressure. Pertinent negatives include no coughing, ear pain, neck pain or sore throat. Past treatments include oral decongestants (NSAIDs). The treatment provided no relief.  Migraine   This is a recurrent problem. The current episode started in the past 7 days. The problem occurs constantly. The problem has been gradually improving. The pain is located in the bilateral region. The pain does not radiate. The pain quality is similar to prior headaches. The quality of the pain is described as pulsating and throbbing. The pain is moderate. Associated symptoms include nausea, photophobia and sinus pressure. Pertinent negatives include no coughing, ear pain, neck pain or sore throat. The symptoms are aggravated by activity and bright light. Treatments tried: Aimovig preventive, migriane cocktail. The treatment provided moderate relief. His past medical history is significant for migraine headaches.    Past Medical History:  Diagnosis Date  . ADHD (attention deficit hyperactivity disorder)   . Appendicitis 1998  . Asthma    Exercise Induced  . Fracture of wrist 1984  . Fracture, maxillary (HCC) 2009  . PONV (postoperative nausea and vomiting)   . Staph infection 2000 and 2002   staph infections post surgeries appe and vasectomy  . Thyroid disease    Past Surgical History:  Procedure Laterality Date  . APPENDECTOMY    . CHONDROPLASTY Right 12/17/2016   Procedure: CHONDROPLASTY;  Surgeon: Loreta Ave, MD;  Location: [facility];  Service: Orthopedics;  Laterality: Right;  .  KNEE ARTHROSCOPY Right 12/17/2016   Procedure: KNEE ARTHROSCOPY, REMOVAL OF LOOSE BODIES;  Surgeon: Loreta Ave, MD;  Location: [facility];  Service: Orthopedics;  Laterality: Right;   Social History   Tobacco Use  . Smoking status: Never Smoker  . Smokeless tobacco: Never Used  Substance Use Topics  . Alcohol use: No    Alcohol/week: 0.0 oz    Types: 1 Glasses of wine per week    Comment: One drink per week.   family history includes Alcohol abuse in his maternal aunt and maternal uncle; Diabetes in his mother; Emphysema in his father; Heart attack in his father; Heart failure in his father; Hypertension in his mother.  ROS: negative except as noted in the HPI  Medications: Current Outpatient Medications  Medication Sig Dispense Refill  . albuterol (PROAIR HFA) 108 (90 Base) MCG/ACT inhaler Inhale 2 puffs into the lungs every 6 (six) hours as needed for wheezing or shortness of breath. 8.5 Inhaler 11  . cholecalciferol (VITAMIN D) 1000 units tablet Take 4,000 Units by mouth daily.    Marland Kitchen dexmethylphenidate (FOCALIN XR) 20 MG 24 hr capsule     . diazepam (VALIUM) 10 MG tablet TAKE 1 TABLET BY MOUTH AT BEDTIME AS NEEDED FOR ANXIETY 30 tablet 1  . HYDROcodone-acetaminophen (NORCO) 5-325 MG tablet Take 1 tablet by  mouth every 4 (four) hours as needed for moderate pain. 30 tablet 0  . L-ARGININE PO Take by mouth daily.    Marland Kitchen. MAGNESIUM CITRATE PO Take 250 mg by mouth.    . Melatonin 3 MG TABS Take 3 mg by mouth.    . Omega-3 Fatty Acids (FISH OIL) 1000 MG CAPS Take 1,000 mg by mouth.    . ondansetron (ZOFRAN) 4 MG tablet Take 1 tablet (4 mg total) by mouth every 8 (eight) hours as needed for nausea or vomiting. 40 tablet 0  . testosterone enanthate (DELATESTRYL) 200 MG/ML injection Inject into the muscle every 14 (fourteen) days. For IM use only    . Thyroid (NATURE-THROID) 113.75 MG TABS Take 112 mcg by mouth.     No current facility-administered medications for this  visit.    Allergies  Allergen Reactions  . Naproxen Nausea And Vomiting       Objective:  BP 124/84   Pulse 67   Temp 98.2 F (36.8 C) (Oral)   Wt 221 lb (100.2 kg)   SpO2 95%   BMI 33.60 kg/m  Gen:  alert, not ill-appearing, no distress, appropriate for age HEENT: head normocephalic without obvious abnormality, conjunctiva and cornea clear, TM's clear bilaterally, there is maxillary sinus tenderness, nasal mucosa edematous, neck supple, no meningeal signs, no adenopathy, trachea midline Pulm: Normal work of breathing, normal phonation, clear to auscultation bilaterally, no wheezes, rales or rhonchi CV: Normal rate, regular rhythm, s1 and s2 distinct, no murmurs, clicks or rubs  Neuro: alert and oriented x 3, no tremor MSK: extremities atraumatic, normal gait and station Skin: intact, no rashes on exposed skin, no jaundice, no cyanosis      No results found for this or any previous visit (from the past 72 hour(s)). No results found.    Assessment and Plan: 52 y.o. male with   1. Acute bacterial sinusitis - cefUROXime (CEFTIN) 250 MG tablet; Take 1 tablet (250 mg total) by mouth 2 (two) times daily with a meal for 10 days.  Dispense: 20 tablet; Refill: 0  2. Migraine without aura and without status migrainosus, not intractable - patient is in Aimovig clinical trial. Recently treated with migraine cocktail in the ED on 02/27/2017. Migraine is not intractable. Requesting prn nausea medication - promethazine (PHENERGAN) 25 MG tablet; Take 0.5-1 tablets (12.5-25 mg total) by mouth every 6 (six) hours as needed for nausea or vomiting.  Dispense: 30 tablet; Refill: 0   Patient education and anticipatory guidance given Patient agrees with treatment plan Follow-up as needed if symptoms worsen or fail to improve  Levonne Hubertharley E. Estephania Licciardi PA-C

## 2017-03-03 NOTE — Patient Instructions (Signed)

## 2017-03-14 ENCOUNTER — Encounter: Payer: Self-pay | Admitting: Physician Assistant

## 2017-03-15 DIAGNOSIS — S83282D Other tear of lateral meniscus, current injury, left knee, subsequent encounter: Secondary | ICD-10-CM | POA: Diagnosis not present

## 2017-03-15 DIAGNOSIS — S83242D Other tear of medial meniscus, current injury, left knee, subsequent encounter: Secondary | ICD-10-CM | POA: Diagnosis not present

## 2017-04-02 DIAGNOSIS — F320 Major depressive disorder, single episode, mild: Secondary | ICD-10-CM | POA: Diagnosis not present

## 2017-04-30 DIAGNOSIS — F320 Major depressive disorder, single episode, mild: Secondary | ICD-10-CM | POA: Diagnosis not present

## 2017-05-21 DIAGNOSIS — R5383 Other fatigue: Secondary | ICD-10-CM | POA: Diagnosis not present

## 2017-05-21 DIAGNOSIS — R7303 Prediabetes: Secondary | ICD-10-CM | POA: Diagnosis not present

## 2017-05-21 DIAGNOSIS — E291 Testicular hypofunction: Secondary | ICD-10-CM | POA: Diagnosis not present

## 2017-05-21 DIAGNOSIS — E039 Hypothyroidism, unspecified: Secondary | ICD-10-CM | POA: Diagnosis not present

## 2017-05-21 LAB — TSH: TSH: 0.17 — AB (ref 0.41–5.90)

## 2017-05-21 LAB — HEMOGLOBIN A1C: HEMOGLOBIN A1C: 5.8

## 2017-05-21 LAB — HEPATIC FUNCTION PANEL
ALK PHOS: 70 (ref 25–125)
ALT: 44 — AB (ref 10–40)
AST: 30 (ref 14–40)

## 2017-05-21 LAB — VITAMIN D 25 HYDROXY (VIT D DEFICIENCY, FRACTURES): Vit D, 25-Hydroxy: 42.3

## 2017-05-21 LAB — CBC AND DIFFERENTIAL: HEMOGLOBIN: 14.7 (ref 13.5–17.5)

## 2017-05-21 LAB — BASIC METABOLIC PANEL
BUN: 15 (ref 4–21)
Creatinine: 1.2 (ref 0.6–1.3)
Glucose: 174
Potassium: 4.4 (ref 3.4–5.3)
Sodium: 140 (ref 137–147)

## 2017-06-02 DIAGNOSIS — F320 Major depressive disorder, single episode, mild: Secondary | ICD-10-CM | POA: Diagnosis not present

## 2017-06-02 DIAGNOSIS — F900 Attention-deficit hyperactivity disorder, predominantly inattentive type: Secondary | ICD-10-CM | POA: Diagnosis not present

## 2017-06-17 ENCOUNTER — Telehealth: Payer: Self-pay

## 2017-06-18 MED ORDER — DIAZEPAM 10 MG PO TABS: 10.0000 mg | ORAL_TABLET | Freq: Every evening | ORAL | 3 refills | Status: DC | PRN

## 2017-06-18 NOTE — Telephone Encounter (Signed)
Sent!

## 2017-06-22 DIAGNOSIS — E039 Hypothyroidism, unspecified: Secondary | ICD-10-CM | POA: Diagnosis not present

## 2017-06-22 DIAGNOSIS — R7989 Other specified abnormal findings of blood chemistry: Secondary | ICD-10-CM | POA: Diagnosis not present

## 2017-06-22 DIAGNOSIS — E7212 Methylenetetrahydrofolate reductase deficiency: Secondary | ICD-10-CM | POA: Diagnosis not present

## 2017-06-22 DIAGNOSIS — F329 Major depressive disorder, single episode, unspecified: Secondary | ICD-10-CM | POA: Diagnosis not present

## 2017-07-18 IMAGING — DX DG CHEST 2V
2 series · 2 of 2 positions shown · non-contrast
Comparison: None.

CLINICAL DATA: Asthma attack and shortness of Breath

EXAM:
CHEST  2 VIEW

[chest pa]
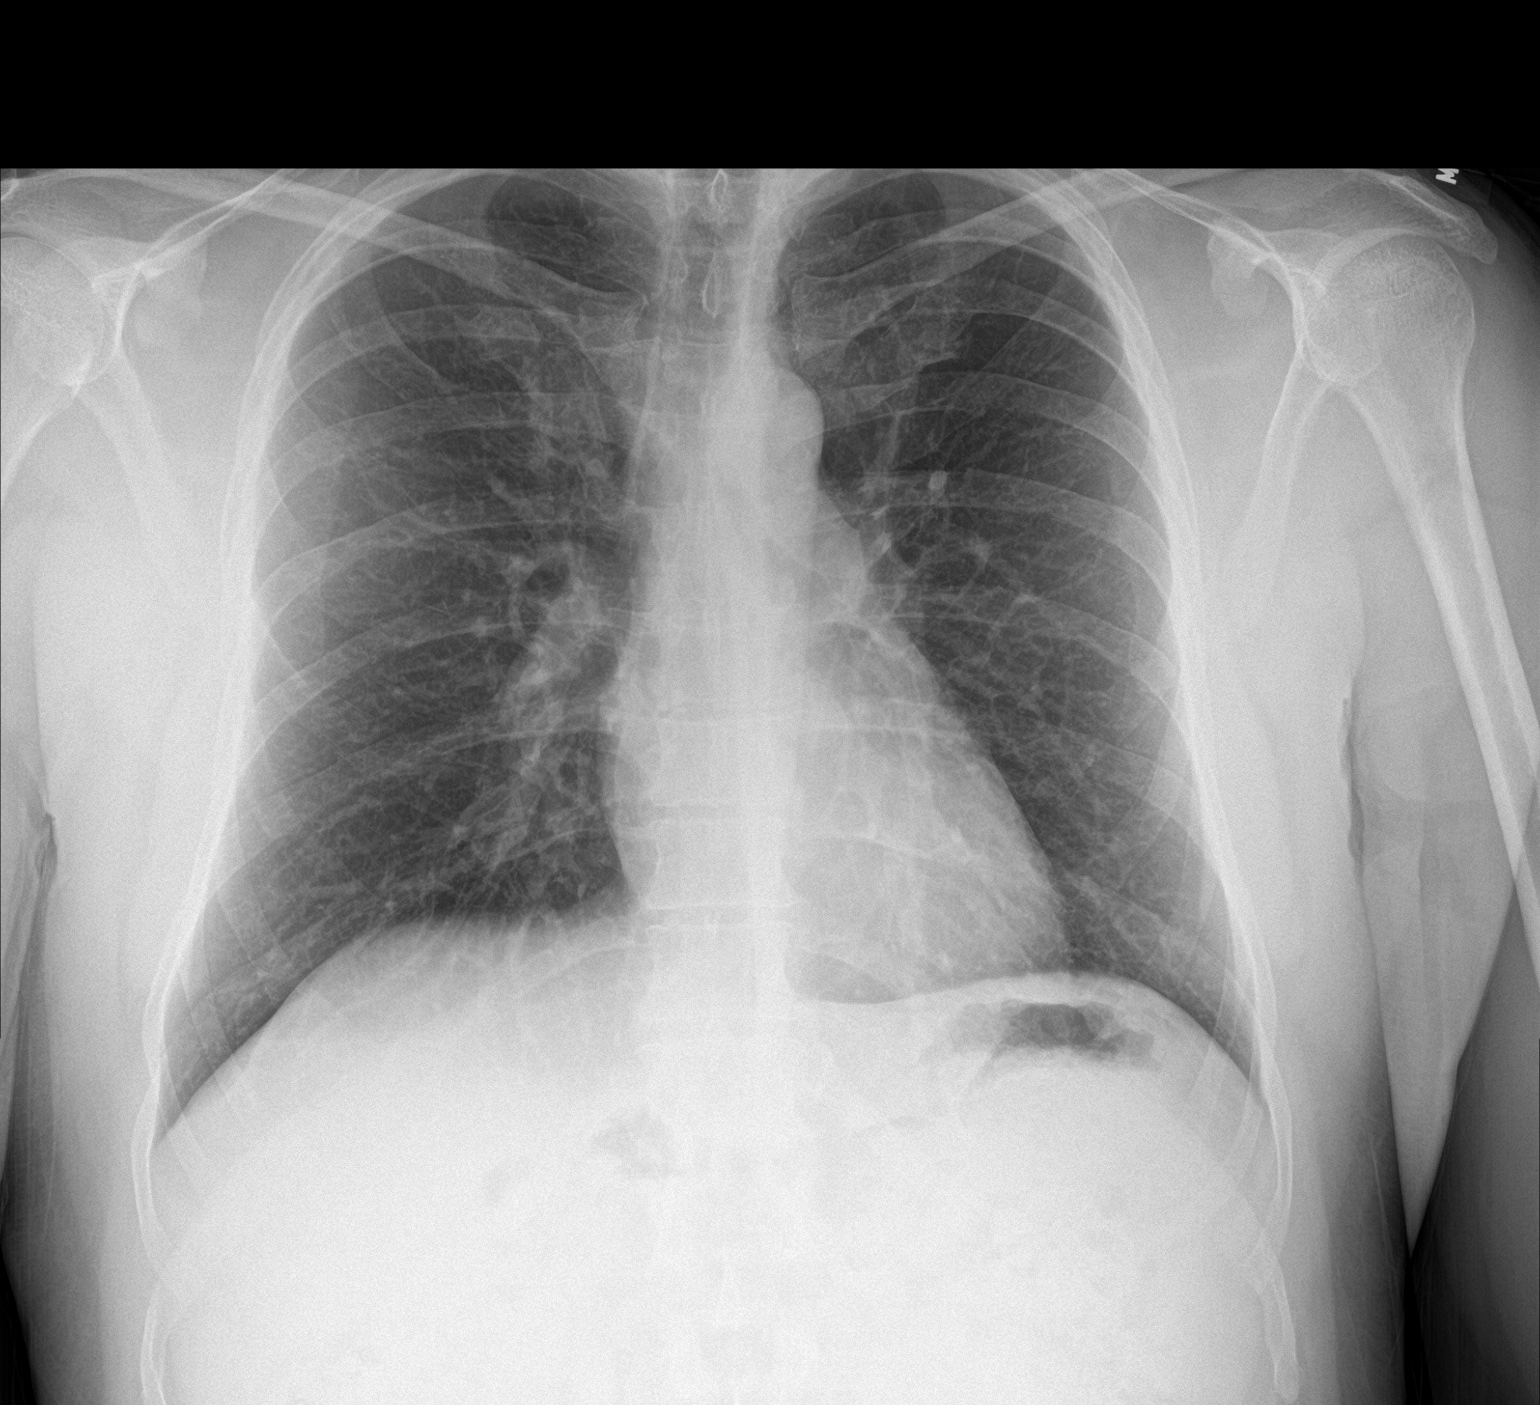

[chest lat]
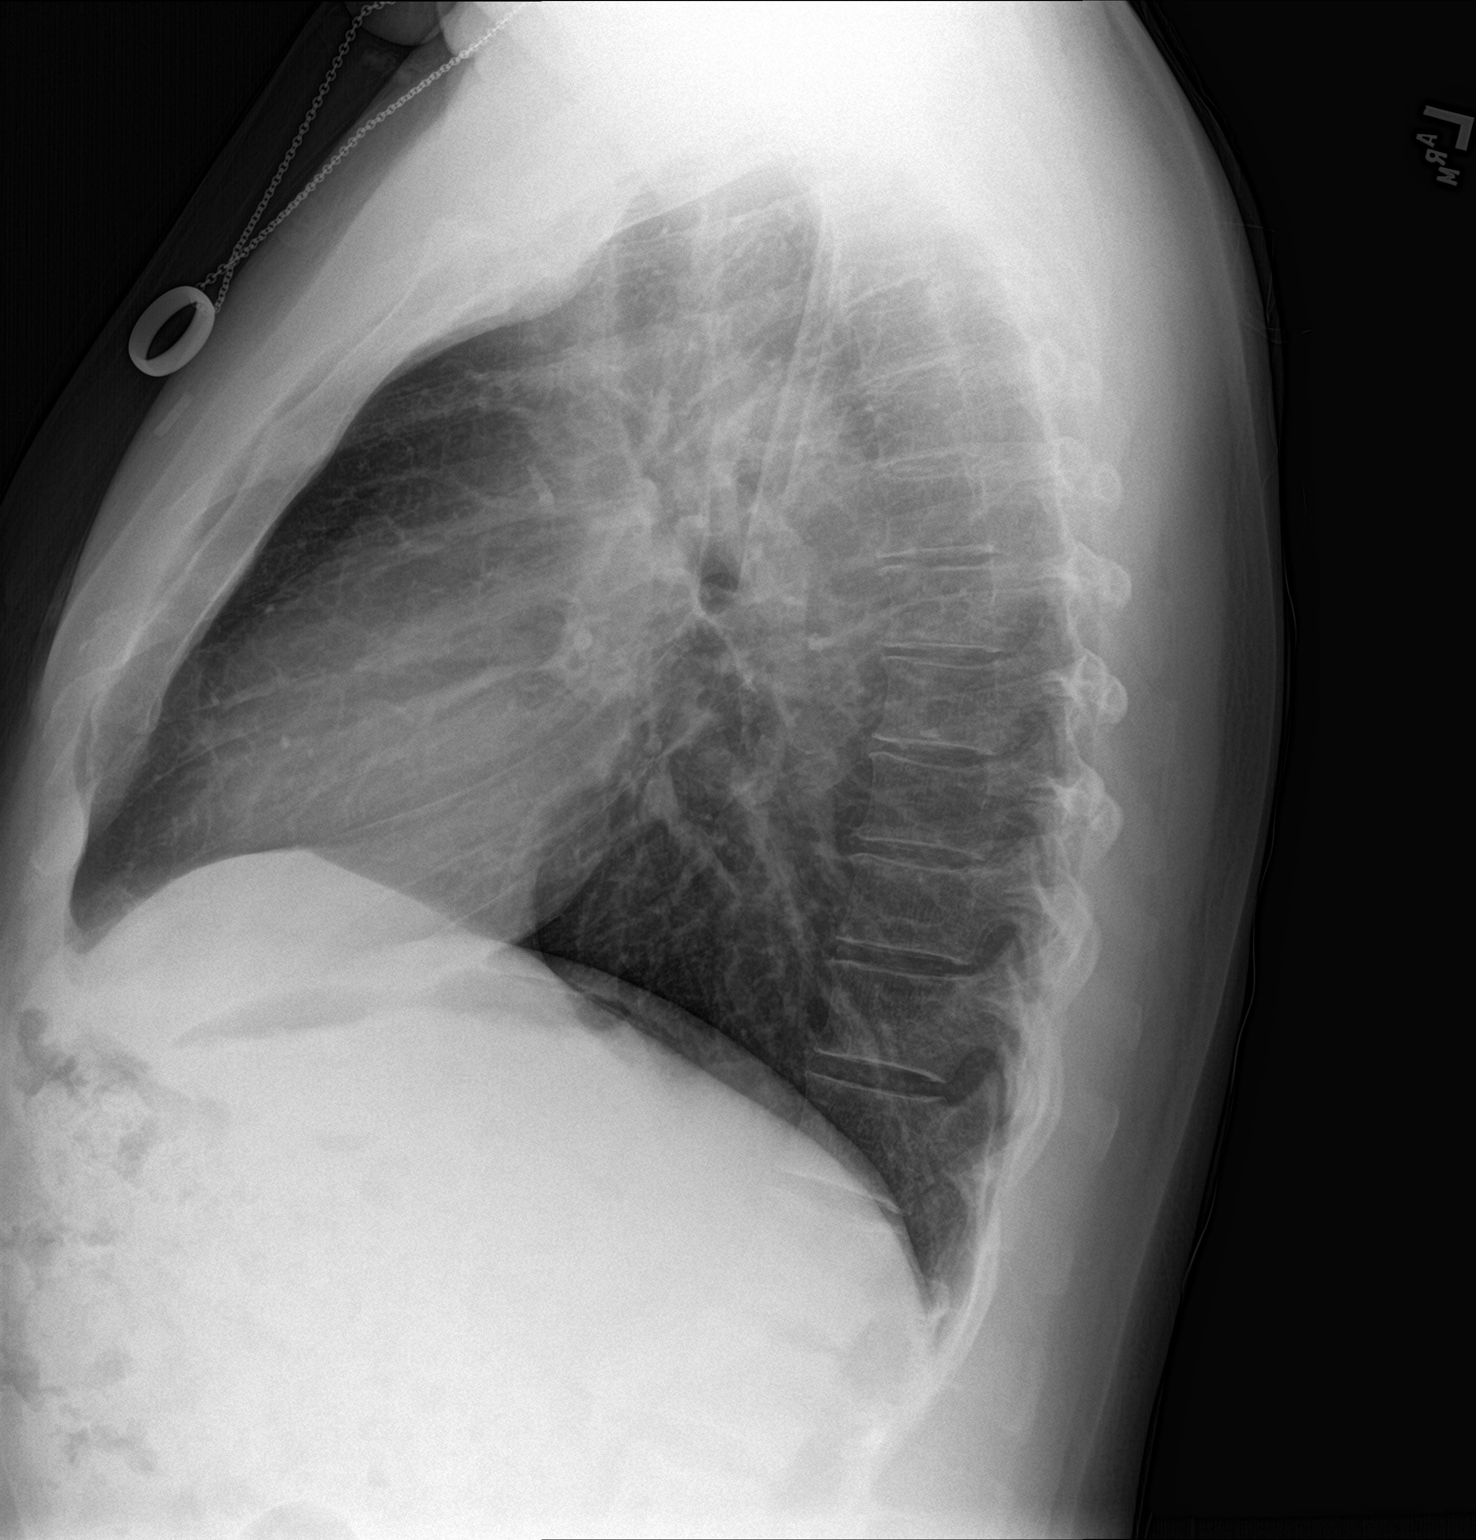

[2 of 2 positions shown; findings below may reference images not displayed]

FINDINGS: The heart size and mediastinal contours are within normal limits.
Both lungs are clear. The visualized skeletal structures are
unremarkable.
IMPRESSION: No active cardiopulmonary disease.

## 2017-07-22 DIAGNOSIS — F320 Major depressive disorder, single episode, mild: Secondary | ICD-10-CM | POA: Diagnosis not present

## 2017-07-22 DIAGNOSIS — F900 Attention-deficit hyperactivity disorder, predominantly inattentive type: Secondary | ICD-10-CM | POA: Diagnosis not present

## 2017-09-03 ENCOUNTER — Encounter: Payer: Self-pay | Admitting: Family Medicine

## 2017-09-03 ENCOUNTER — Ambulatory Visit (INDEPENDENT_AMBULATORY_CARE_PROVIDER_SITE_OTHER): Payer: BLUE CROSS/BLUE SHIELD | Admitting: Family Medicine

## 2017-09-03 VITALS — BP 118/77 | HR 65 | Ht 68.0 in | Wt 208.0 lb

## 2017-09-03 DIAGNOSIS — E782 Mixed hyperlipidemia: Secondary | ICD-10-CM

## 2017-09-03 DIAGNOSIS — E039 Hypothyroidism, unspecified: Secondary | ICD-10-CM | POA: Diagnosis not present

## 2017-09-03 DIAGNOSIS — Z0000 Encounter for general adult medical examination without abnormal findings: Secondary | ICD-10-CM | POA: Diagnosis not present

## 2017-09-03 DIAGNOSIS — Z0184 Encounter for antibody response examination: Secondary | ICD-10-CM

## 2017-09-03 DIAGNOSIS — Z789 Other specified health status: Secondary | ICD-10-CM

## 2017-09-03 DIAGNOSIS — Z6831 Body mass index (BMI) 31.0-31.9, adult: Secondary | ICD-10-CM

## 2017-09-03 DIAGNOSIS — Z1211 Encounter for screening for malignant neoplasm of colon: Secondary | ICD-10-CM

## 2017-09-03 NOTE — Progress Notes (Signed)
Scott [name] a 52 y.o. male who presents to [facility] today for well adult visit.  He is doing pretty well overall.  He notes that he is had some weight gain and is currently in the process of losing it again by eating a careful diet and exercising regularly.  He exercises daily.  He receives care through [facility] for his testosterone and thyroid deficiency.  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  He has forms that need to be filled out and he notes that he needs MMR and varicella titers.  Additionally he will need a flu vaccine and hep B series in the future.  Additionally he will need a PPD.   ROS as above:  Past Medical History:  Diagnosis Date  . ADHD (attention deficit hyperactivity disorder)   . Appendicitis 1998  . Asthma    Exercise Induced  . Fracture of wrist 1984  . Fracture, maxillary ([facility]) 2009  . PONV (postoperative nausea and vomiting)   . Staph infection 2000 and 2002   staph infections post surgeries appe and vasectomy  . Thyroid disease    Past Surgical History:  Procedure Laterality Date  . APPENDECTOMY    . CHONDROPLASTY Right 12/17/2016   Procedure: CHONDROPLASTY;  Surgeon: Ninetta Lights, MD;  Location: [facility];  Service: Orthopedics;  Laterality: Right;  . KNEE ARTHROSCOPY Right 12/17/2016   Procedure: KNEE ARTHROSCOPY, REMOVAL OF LOOSE BODIES;  Surgeon: Ninetta Lights, MD;  Location: [facility];  Service: Orthopedics;  Laterality: Right;   Social History   Tobacco Use  . Smoking status: Never Smoker  . Smokeless tobacco: Never Used  Substance Use Topics  . Alcohol use: No    Alcohol/week: 0.3 oz    Types: 1 Glasses of wine per week    Comment: One drink per week.   family history includes Alcohol abuse in his  maternal aunt and maternal uncle; Diabetes in his mother; Emphysema in his father; Heart attack in his father; Heart failure in his father; Hypertension in his mother.  Medications: Current Outpatient Medications  Medication Sig Dispense Refill  . albuterol (PROAIR HFA) 108 (90 Base) MCG/ACT inhaler Inhale 2 puffs into the lungs every 6 (six) hours as needed for wheezing or shortness of breath. 8.5 Inhaler 11  . cholecalciferol (VITAMIN D) 1000 units tablet Take 4,000 Units by mouth daily.    Marland Kitchen dexmethylphenidate (FOCALIN XR) 20 MG 24 hr capsule     . diazepam (VALIUM) 10 MG tablet Take 1 tablet (10 mg total) by mouth at bedtime as needed for anxiety. 30 tablet 3  . L-ARGININE PO Take by mouth daily.    Marland Kitchen MAGNESIUM CITRATE PO Take 250 mg by mouth.    . Melatonin 3 MG TABS Take 3 mg by mouth.    . Omega-3 Fatty Acids (FISH OIL) 1000 MG CAPS Take 1,000 mg by mouth.    . ondansetron (ZOFRAN) 4 MG tablet Take 1 tablet (4 mg total) by mouth every 8 (eight) hours as needed for nausea or vomiting. 40 tablet 0  . promethazine (PHENERGAN) 25 MG tablet Take 0.5-1 tablets (12.5-25 mg total) by mouth every 6 (six) hours as needed for nausea or vomiting. 30 tablet 0  . testosterone enanthate (DELATESTRYL) 200 MG/ML injection Inject into the muscle every 14 (fourteen) days. For IM use only    .  Thyroid (NATURE-THROID) 113.75 MG TABS Take 112 mcg by mouth.     No current facility-administered medications for this visit.    Allergies  Allergen Reactions  . Naproxen Nausea And Vomiting    Health Maintenance Health Maintenance  Topic Date Due  . Fecal DNA (Cologuard)  11/18/2013  . INFLUENZA VACCINE  10/24/2028 (Originally 09/23/2017)  . TETANUS/TDAP  04/08/2025  . Hepatitis C Screening  Completed  . HIV Screening  Completed     Exam:  BP 118/77   Pulse 65   Ht _0  (1.727 m)   Wt 208 lb (94.3 kg)   BMI 31.63 kg/m  Wt Readings from Last 5 Encounters:  09/03/17 208 lb (94.3 kg)  03/03/17 221  lb (100.2 kg)  12/17/16 208 lb 6 oz (94.5 kg)  10/07/16 196 lb (88.9 kg)  10/01/16 202 lb (91.6 kg)     Gen: Well NAD HEENT: EOMI,  MMM Lungs: Normal work of breathing. CTABL Heart: RRR no MRG Abd: NABS, Soft. Nondistended, Nontender Exts: Brisk capillary refill, warm and well perfused.  Psych: Alert and oriented normal speech thought process and affect. Skin no dysplastic appearing nevi present.  Depression screen [facility] 2/9 09/03/2017 10/01/2016  Decreased Interest 0 0  Down, Depressed, Hopeless 0 0  PHQ - 2 Score 0 0       Assessment and Plan: 52 y.o. male with  Well adult.  Doing reasonably well.  BMI greater than 30.  Discussed nutrition strategies.  Plan to recheck lipids metabolic panel and CBC.  Will obtain lab reports from [facility] [place] as that will also be helpful. Discussed colon cancer screening.  Cologuard ordered. 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  Additionally will administer PPD via nurse visit next week.  Once labs are back and PPD results are back will complete form and email back to patient. [email or web address]   Orders Placed This Encounter  Procedures  . Measles/Mumps/Rubella Immunity  . Varicella zoster antibody, IgG  . CBC  . COMPLETE METABOLIC PANEL WITH GFR  . Lipid Panel w/reflex Direct LDL  . Cologuard   No orders of the defined types were placed in this encounter.    Discussed warning signs or symptoms. Please see discharge instructions. Patient expresses understanding.

## 2017-09-03 NOTE — Patient Instructions (Addendum)
Thank you for coming in today.  We will email the form to you at [email or web address]  Schedule a nurse visit for Hep B series, and PPD TB test.   I will get results to you ASAP and fill form out.   Work on weight loss.   Continue quad strength.   Recheck as needed.   Return the cologuard test.

## 2017-09-06 LAB — CBC
HEMATOCRIT: 48.2 % (ref 38.5–50.0)
HEMOGLOBIN: 16 g/dL (ref 13.2–17.1)
MCH: 29.5 pg (ref 27.0–33.0)
MCHC: 33.2 g/dL (ref 32.0–36.0)
MCV: 88.9 fL (ref 80.0–100.0)
MPV: 10.4 fL (ref 7.5–12.5)
PLATELETS: 226 10*3/uL (ref 140–400)
RBC: 5.42 10*6/uL (ref 4.20–5.80)
RDW: 12.7 % (ref 11.0–15.0)
WBC: 7.7 10*3/uL (ref 3.8–10.8)

## 2017-09-06 LAB — COMPLETE METABOLIC PANEL WITH GFR
AG RATIO: 2.1 (calc) (ref 1.0–2.5)
ALBUMIN MSPROF: 4.4 g/dL (ref 3.6–5.1)
ALT: 18 U/L (ref 9–46)
AST: 19 U/L (ref 10–35)
Alkaline phosphatase (APISO): 72 U/L (ref 40–115)
BUN: 17 mg/dL (ref 7–25)
CALCIUM: 9.4 mg/dL (ref 8.6–10.3)
CHLORIDE: 104 mmol/L (ref 98–110)
CO2: 24 mmol/L (ref 20–32)
Creat: 1.19 mg/dL (ref 0.70–1.33)
GFR, EST NON AFRICAN AMERICAN: 69 mL/min/{1.73_m2} (ref >=60)
GFR, Est African American: 80 mL/min/{1.73_m2} (ref >=60)
Globulin: 2.1 g/dL (calc) (ref 1.9–3.7)
Glucose, Bld: 101 mg/dL — ABNORMAL HIGH (ref 65–99)
Potassium: 4.5 mmol/L (ref 3.5–5.3)
Sodium: 138 mmol/L (ref 135–146)
Total Bilirubin: 0.7 mg/dL (ref 0.2–1.2)
Total Protein: 6.5 g/dL (ref 6.1–8.1)

## 2017-09-06 LAB — MEASLES/MUMPS/RUBELLA IMMUNITY
Mumps IgG: 234 AU/mL
RUBEOLA IGG: 174 [AU]/ml
Rubella: 11.8 index

## 2017-09-06 LAB — LIPID PANEL W/REFLEX DIRECT LDL
CHOLESTEROL: 170 mg/dL (ref <200)
HDL: 46 mg/dL (ref >40)
LDL Cholesterol (Calc): 106 mg/dL (calc) — ABNORMAL HIGH
Non-HDL Cholesterol (Calc): 124 mg/dL (calc) (ref <130)
Total CHOL/HDL Ratio: 3.7 (calc) (ref <5.0)
Triglycerides: 89 mg/dL (ref <150)

## 2017-09-06 LAB — VARICELLA ZOSTER ANTIBODY, IGG: VARICELLA IGG: 715.7 {index}

## 2017-09-14 ENCOUNTER — Ambulatory Visit (INDEPENDENT_AMBULATORY_CARE_PROVIDER_SITE_OTHER): Payer: BLUE CROSS/BLUE SHIELD | Admitting: Family Medicine

## 2017-09-14 VITALS — BP 118/74 | HR 63 | Temp 98.5°F | Wt 204.4 lb

## 2017-09-14 DIAGNOSIS — Z111 Encounter for screening for respiratory tuberculosis: Secondary | ICD-10-CM

## 2017-09-14 NOTE — Progress Notes (Signed)
Pt is here for PPD placement. Pt waived/declined to complete HEP B series during nurse visit. Pt had voiced no concerns today. As per pt, he works for [employer] and has forms that will need to be completed when vaccination is administered. Blood pressure reading was 118/74, pulse 63. Pt tolerated PPD placement well on left outer forearm. Pt has been instructed to return to clinic within 48 to 72 hrs for PPD reading or PPD placement will need to be repeated. Pt is aware we will call with any updates or changes.

## 2017-09-15 ENCOUNTER — Telehealth: Payer: Self-pay | Admitting: Family Medicine

## 2017-09-15 ENCOUNTER — Encounter: Payer: Self-pay | Admitting: Family Medicine

## 2017-09-15 DIAGNOSIS — R7989 Other specified abnormal findings of blood chemistry: Secondary | ICD-10-CM | POA: Insufficient documentation

## 2017-09-15 DIAGNOSIS — R7303 Prediabetes: Secondary | ICD-10-CM

## 2017-09-15 HISTORY — DX: Other specified abnormal findings of blood chemistry: R79.89

## 2017-09-15 HISTORY — DX: Prediabetes: R73.03

## 2017-09-15 NOTE — Telephone Encounter (Signed)
Labs and [place] received from [facility]. Will be abstracted and sent to scan.

## 2017-09-17 ENCOUNTER — Ambulatory Visit (INDEPENDENT_AMBULATORY_CARE_PROVIDER_SITE_OTHER): Payer: BLUE CROSS/BLUE SHIELD | Admitting: Family Medicine

## 2017-09-17 VITALS — BP 122/72 | HR 60

## 2017-09-17 DIAGNOSIS — Z111 Encounter for screening for respiratory tuberculosis: Secondary | ICD-10-CM | POA: Diagnosis not present

## 2017-09-17 LAB — TB SKIN TEST
INDURATION: 0 mm
TB Skin Test: NEGATIVE

## 2017-09-17 NOTE — Progress Notes (Signed)
[name] presents to the clinic for PPD reading.  Patient denied redness, itching, and swelling at the injection site. Forms filled out and given back to patient. -EH/RMA

## 2017-09-29 ENCOUNTER — Encounter: Payer: Self-pay | Admitting: Family Medicine

## 2017-09-29 LAB — ESTRADIOL: Estradiol: 36.1

## 2017-09-29 LAB — PROSTATE-SPECIFIC AG, SERUM (LABCORP): Prostate Specific Ag, Serum: 0.6

## 2017-09-29 LAB — TESTOSTERONE: Testosterone, Serum: 726

## 2017-09-29 LAB — CALCITRIOL (1,25 DI-OH VIT D): Vit D, 1,25-Dihydroxy: 42.3

## 2017-09-29 LAB — T3, FREE: Triiodothyronine, Free, Serum: 4.1

## 2017-10-08 ENCOUNTER — Ambulatory Visit (INDEPENDENT_AMBULATORY_CARE_PROVIDER_SITE_OTHER): Payer: BLUE CROSS/BLUE SHIELD | Admitting: Family Medicine

## 2017-10-08 VITALS — BP 107/54 | HR 62 | Temp 98.1°F

## 2017-10-08 DIAGNOSIS — Z23 Encounter for immunization: Secondary | ICD-10-CM

## 2017-10-08 DIAGNOSIS — R5383 Other fatigue: Secondary | ICD-10-CM | POA: Diagnosis not present

## 2017-10-08 DIAGNOSIS — E291 Testicular hypofunction: Secondary | ICD-10-CM | POA: Diagnosis not present

## 2017-10-08 DIAGNOSIS — Z111 Encounter for screening for respiratory tuberculosis: Secondary | ICD-10-CM | POA: Diagnosis not present

## 2017-10-08 DIAGNOSIS — E039 Hypothyroidism, unspecified: Secondary | ICD-10-CM | POA: Diagnosis not present

## 2017-10-08 DIAGNOSIS — R7303 Prediabetes: Secondary | ICD-10-CM | POA: Diagnosis not present

## 2017-10-08 NOTE — Progress Notes (Signed)
   Subjective:    Patient ID: Scott Fields, male    DOB: 12/02/1965, 52 y.o.   MRN: [id number]  HPI  Scott [name] here for PPD placement and start of the Hep B series. Denies any problems today.   Review of Systems     Objective:   Physical Exam        Assessment & Plan:  PPD placement / Hepatitis B vaccine- Patient tolerated injections well without complications. Patient advised to schedule next injection 2-3 days from today for PPD read. Also to schedule next Hep B in 1 month and the last in 6 months.

## 2017-10-11 ENCOUNTER — Ambulatory Visit (INDEPENDENT_AMBULATORY_CARE_PROVIDER_SITE_OTHER): Payer: BLUE CROSS/BLUE SHIELD | Admitting: Family Medicine

## 2017-10-11 VITALS — BP 130/77 | HR 66 | Temp 98.1°F

## 2017-10-11 DIAGNOSIS — Z23 Encounter for immunization: Secondary | ICD-10-CM

## 2017-10-11 DIAGNOSIS — Z111 Encounter for screening for respiratory tuberculosis: Secondary | ICD-10-CM

## 2017-10-11 LAB — TB SKIN TEST
INDURATION: 0 mm
TB SKIN TEST: NEGATIVE

## 2017-10-11 NOTE — Progress Notes (Signed)
Patient present to clinic for PPD read. Patient needs this for [facility]. While patient was in clinic he would like flu vaccine. Patient screened with flu questionnaire, all questions answered appropriately. Tolerated flu injection in right deltoid well. No immediate complications. PPD read from right forearm negative. Printed immunization record and PPD result prior to leaving clinic. No further questions or concerns.

## 2017-10-21 DIAGNOSIS — F329 Major depressive disorder, single episode, unspecified: Secondary | ICD-10-CM | POA: Diagnosis not present

## 2017-10-21 DIAGNOSIS — E7212 Methylenetetrahydrofolate reductase deficiency: Secondary | ICD-10-CM | POA: Diagnosis not present

## 2017-10-21 DIAGNOSIS — E039 Hypothyroidism, unspecified: Secondary | ICD-10-CM | POA: Diagnosis not present

## 2017-10-21 DIAGNOSIS — R7303 Prediabetes: Secondary | ICD-10-CM | POA: Diagnosis not present

## 2017-11-02 DIAGNOSIS — F900 Attention-deficit hyperactivity disorder, predominantly inattentive type: Secondary | ICD-10-CM | POA: Diagnosis not present

## 2017-11-02 DIAGNOSIS — F320 Major depressive disorder, single episode, mild: Secondary | ICD-10-CM | POA: Diagnosis not present

## 2017-11-11 ENCOUNTER — Ambulatory Visit (INDEPENDENT_AMBULATORY_CARE_PROVIDER_SITE_OTHER): Payer: BLUE CROSS/BLUE SHIELD | Admitting: Family Medicine

## 2017-11-11 DIAGNOSIS — Z23 Encounter for immunization: Secondary | ICD-10-CM | POA: Diagnosis not present

## 2017-11-11 NOTE — Progress Notes (Signed)
   Subjective:    Patient ID: Ellard ArtisMark Hemrick, male    DOB: Nov 08, 1965, 52 y.o.   MRN: [id number]  HPI  [name] is here for 2nd hepatitis B vaccine.   Review of Systems     Objective:   Physical Exam        Assessment & Plan:  Patient tolerated injection well without complications. Patient advised to schedule next injection 5 months from today.

## 2017-12-01 DIAGNOSIS — F320 Major depressive disorder, single episode, mild: Secondary | ICD-10-CM | POA: Diagnosis not present

## 2018-01-02 ENCOUNTER — Encounter: Payer: Self-pay | Admitting: Emergency Medicine

## 2018-01-02 ENCOUNTER — Emergency Department (INDEPENDENT_AMBULATORY_CARE_PROVIDER_SITE_OTHER)
Admission: EM | Admit: 2018-01-02 | Discharge: 2018-01-02 | Disposition: A | Discharge status: Home / Self Care | Payer: BLUE CROSS/BLUE SHIELD | Source: Home / Self Care | Attending: Family Medicine | Admitting: Family Medicine

## 2018-01-02 ENCOUNTER — Other Ambulatory Visit: Payer: Self-pay

## 2018-01-02 DIAGNOSIS — B9789 Other viral agents as the cause of diseases classified elsewhere: Secondary | ICD-10-CM | POA: Diagnosis not present

## 2018-01-02 DIAGNOSIS — J069 Acute upper respiratory infection, unspecified: Secondary | ICD-10-CM | POA: Diagnosis not present

## 2018-01-02 DIAGNOSIS — J9801 Acute bronchospasm: Secondary | ICD-10-CM

## 2018-01-02 MED ORDER — AZITHROMYCIN 250 MG PO TABS: ORAL_TABLET | ORAL | 0 refills | Status: DC

## 2018-01-02 MED ORDER — PREDNISONE 20 MG PO TABS: ORAL_TABLET | ORAL | 0 refills | Status: DC

## 2018-01-02 NOTE — ED Provider Notes (Signed)
[facility]    CSN: [id number] Arrival date & time: 01/02/18  1133     History   Chief Complaint Chief Complaint  Patient presents with  . Nasal Congestion  . Cough    HPI Scott [name] a 52 y.o. male.   After returning home on a domestic flight one week ago, patient developed typical cold-like symptoms developing over several days, including mild sore throat, sinus congestion, fatigue, and cough.  He has a history of exercise induced asthma, and last night he developed wheezing and shortness of breath.  He had pneumonia about 10 years ago.  The history is provided by the patient and the spouse.    Past Medical History:  Diagnosis Date  . ADHD (attention deficit hyperactivity disorder)   . Appendicitis 1998  . Asthma    Exercise Induced  . Fracture of wrist 1984  . Fracture, maxillary ([facility]) 2009  . Low testosterone 09/15/2017  . PONV (postoperative nausea and vomiting)   . Prediabetes 09/15/2017  . Staph infection 2000 and 2002   staph infections post surgeries appe and vasectomy  . Thyroid disease     Patient Active Problem List   Diagnosis Date Noted  . Prediabetes 09/15/2017  . Low testosterone 09/15/2017  . Chondromalacia of lateral femoral condyle, left 10/07/2016  . Chondromalacia, patella, left 10/07/2016  . Seborrheic dermatitis 12/16/2015  . Headache, migraine 08/06/2015  . Exercise-induced asthma 08/06/2015  . Attention deficit hyperactivity disorder 07/02/2011  . HLD (hyperlipidemia) 02/20/2011  . Avitaminosis D 02/20/2011  . Apnea, sleep 12/10/2010  . Adult hypothyroidism 10/29/2010    Past Surgical History:  Procedure Laterality Date  . APPENDECTOMY    . CHONDROPLASTY Right 12/17/2016   Procedure: CHONDROPLASTY;  Surgeon: Loreta Ave, MD;  Location: [facility];  Service: Orthopedics;  Laterality: Right;  . KNEE ARTHROSCOPY Right 12/17/2016   Procedure: KNEE ARTHROSCOPY, REMOVAL OF LOOSE BODIES;  Surgeon:  Loreta Ave, MD;  Location: [facility];  Service: Orthopedics;  Laterality: Right;       Home Medications    Prior to Admission medications   Medication Sig Start Date End Date Taking? Authorizing Provider  albuterol (PROAIR HFA) 108 (90 Base) MCG/ACT inhaler Inhale 2 puffs into the lungs every 6 (six) hours as needed for wheezing or shortness of breath. 05/26/16   Rodolph Bong, MD  azithromycin (ZITHROMAX Z-PAK) 250 MG tablet Take 2 tabs today; then begin one tab once daily for 4 more days. 01/02/18   Lattie Haw, MD  cholecalciferol (VITAMIN D) 1000 units tablet Take 4,000 Units by mouth daily.    [provider]  dexmethylphenidate (FOCALIN XR) 20 MG 24 hr capsule  12/24/15   [provider]  diazepam (VALIUM) 10 MG tablet Take 1 tablet (10 mg total) by mouth at bedtime as needed for anxiety. 06/18/17   Rodolph Bong, MD  L-ARGININE PO Take by mouth daily.    [provider]  MAGNESIUM CITRATE PO Take 250 mg by mouth.    [provider]  Melatonin 3 MG TABS Take 3 mg by mouth.    [provider]  Omega-3 Fatty Acids (FISH OIL) 1000 MG CAPS Take 1,000 mg by mouth.    [provider]  ondansetron (ZOFRAN) 4 MG tablet Take 1 tablet (4 mg total) by mouth every 8 (eight) hours as needed for nausea or vomiting. 12/17/16   Cristie Hem, PA-C  predniSONE (DELTASONE) 20 MG tablet  Take one tab by mouth twice daily for 5 days, then one daily for 3 days. Take with food. 01/02/18   Lattie Haw, MD  promethazine (PHENERGAN) 25 MG tablet Take 0.5-1 tablets (12.5-25 mg total) by mouth every 6 (six) hours as needed for nausea or vomiting. 03/03/17   Carlis Stable, PA-C  testosterone enanthate (DELATESTRYL) 200 MG/ML injection Inject into the muscle every 14 (fourteen) days. For IM use only    [provider]  Thyroid (NATURE-THROID) 113.75 MG TABS Take 112 mcg by mouth.    [provider]     Family History Family History  Problem Relation Age of Onset  . Heart attack Father   . Emphysema Father   . Heart failure Father   . Alcohol abuse Maternal Aunt   . Alcohol abuse Maternal Uncle   . Diabetes Mother   . Hypertension Mother     Social History Social History   Tobacco Use  . Smoking status: Never Smoker  . Smokeless tobacco: Never Used  Substance Use Topics  . Alcohol use: No    Alcohol/week: 0.5 standard drinks    Types: 1 Glasses of wine per week    Comment: One drink per week.  . Drug use: No     Allergies   Naproxen   Review of Systems Review of Systems + sore throat + cough No pleuritic pain + wheezing + nasal congestion + post-nasal drainage + sinus pain/pressure No itchy/red eyes No earache No hemoptysis + SOB No fever, + chills No nausea No vomiting No abdominal pain No diarrhea No urinary symptoms No skin rash + fatigue No myalgias No headache Used OTC meds without relief   Physical Exam Triage Vital Signs ED Triage Vitals  Enc Vitals Group     BP 01/02/18 1225 115/79     Pulse Rate 01/02/18 1225 70     Resp --      Temp 01/02/18 1225 98.2 F (36.8 C)     Temp Source 01/02/18 1225 Oral     SpO2 01/02/18 1225 97 %     Weight 01/02/18 1226 220 lb (99.8 kg)     Height 01/02/18 1226 5\' 8"  (1.727 m)     Head Circumference --      Peak Flow --      Pain Score 01/02/18 1226 0     Pain Loc --      Pain Edu? --      Excl. in GC? --    No data found.  Updated Vital Signs BP 115/79 (BP Location: Right Arm)   Pulse 70   Temp 98.2 F (36.8 C) (Oral)   Ht 5\' 8"  (1.727 m)   Wt 99.8 kg   SpO2 97%   BMI 33.45 kg/m   Visual Acuity Right Eye Distance:   Left Eye Distance:   Bilateral Distance:    Right Eye Near:   Left Eye Near:    Bilateral Near:     Physical Exam Nursing notes and Vital Signs reviewed. Appearance:  Patient appears stated age, and in no acute distress Eyes:  Pupils are equal, round, and  reactive to light and accomodation.  Extraocular movement is intact.  Conjunctivae are not inflamed  Ears:  Canals normal.  Tympanic membranes normal.  Nose:  Congested turbinates.  No sinus tenderness.   Pharynx:  Normal Neck:  Supple.  Enlarged posterior/lateral nodes are palpated bilaterally, tender to palpation on the left.   Lungs:  Clear to  auscultation.  Breath sounds are equal.  Moving air well. Heart:  Regular rate and rhythm without murmurs, rubs, or gallops.  Abdomen:  Nontender without masses or hepatosplenomegaly.  Bowel sounds are present.  No CVA or flank tenderness.  Extremities:  No edema.  Skin:  No rash present.    UC Treatments / Results  Labs (all labs ordered are listed, but only abnormal results are displayed) Labs Reviewed - No data to display  EKG None  Radiology No results found.  Procedures Procedures (including critical care time)  Medications Ordered in UC Medications - No data to display  Initial Impression / Assessment and Plan / UC Course  I have reviewed the triage vital signs and the nursing notes.  Pertinent labs & imaging results that were available during my care of the patient were reviewed by me and considered in my medical decision making (see chart for details).     Because of his history of reactive airways disease and past history of pneumonia, will begin a Z-pak, and prednisone burst/taper. Followup with Family Doctor if not improved in about 10 days.   Final Clinical Impressions(s) / UC Diagnoses   Final diagnoses:  Viral URI with cough  Bronchospasm, acute     Discharge Instructions     Take plain guaifenesin (1200mg  extended release tabs such as Mucinex) twice daily, with plenty of water, for cough and congestion.  May add Pseudoephedrine (30mg , one or two every 4 to 6 hours) for sinus congestion.  Get adequate rest.   May use Afrin nasal spray (or generic oxymetazoline) each morning for about 5 days and then discontinue.   Also recommend using saline nasal spray several times daily and saline nasal irrigation (AYR is a common brand).  Use Flonase nasal spray each morning after using Afrin nasal spray and saline nasal irrigation. Try warm salt water gargles for sore throat.  Stop all antihistamines for now, and other non-prescription cough/cold preparations. May take Delsym Cough Suppressant at bedtime for nighttime cough.  Continue albuterol inhaler as needed.        ED Prescriptions    Medication Sig Dispense Auth. Provider   azithromycin (ZITHROMAX Z-PAK) 250 MG tablet Take 2 tabs today; then begin one tab once daily for 4 more days. 6 tablet Lattie Haw, MD   predniSONE (DELTASONE) 20 MG tablet Take one tab by mouth twice daily for 5 days, then one daily for 3 days. Take with food. 13 tablet Lattie Haw, MD        Lattie Haw, MD 01/03/18 2117

## 2018-01-02 NOTE — Discharge Instructions (Addendum)
Take plain guaifenesin (1200mg extended release tabs such as Mucinex) twice daily, with plenty of water, for cough and congestion.  May add Pseudoephedrine (30mg, one or two every 4 to 6 hours) for sinus congestion.  Get adequate rest.   °May use Afrin nasal spray (or generic oxymetazoline) each morning for about 5 days and then discontinue.  Also recommend using saline nasal spray several times daily and saline nasal irrigation (AYR is a common brand).  Use Flonase nasal spray each morning after using Afrin nasal spray and saline nasal irrigation. °Try warm salt water gargles for sore throat.  °Stop all antihistamines for now, and other non-prescription cough/cold preparations. °May take Delsym Cough Suppressant at bedtime for nighttime cough.  °Continue albuterol inhaler as needed. °  °

## 2018-01-02 NOTE — ED Triage Notes (Signed)
Patient reports congestion and sore throat starting 6 days ago; now also has cough; no known fever. OTCs not effective. Gets sinus infections about this time every year.

## 2018-01-04 DIAGNOSIS — F320 Major depressive disorder, single episode, mild: Secondary | ICD-10-CM | POA: Diagnosis not present

## 2018-01-04 DIAGNOSIS — F900 Attention-deficit hyperactivity disorder, predominantly inattentive type: Secondary | ICD-10-CM | POA: Diagnosis not present

## 2018-01-29 IMAGING — DX DG KNEE COMPLETE 4+V*L*
4 series · 5 of 5 positions shown · non-contrast
Comparison: None.

CLINICAL DATA: Left knee pain after hyperextending the knee playing
tennis 2 months ago, pain with going up stairs

EXAM:
LEFT KNEE - COMPLETE 4+ VIEW

[Series 2: tunnel · 0.14mm/px · 2 of 2 slices shown]
[im 1/2]
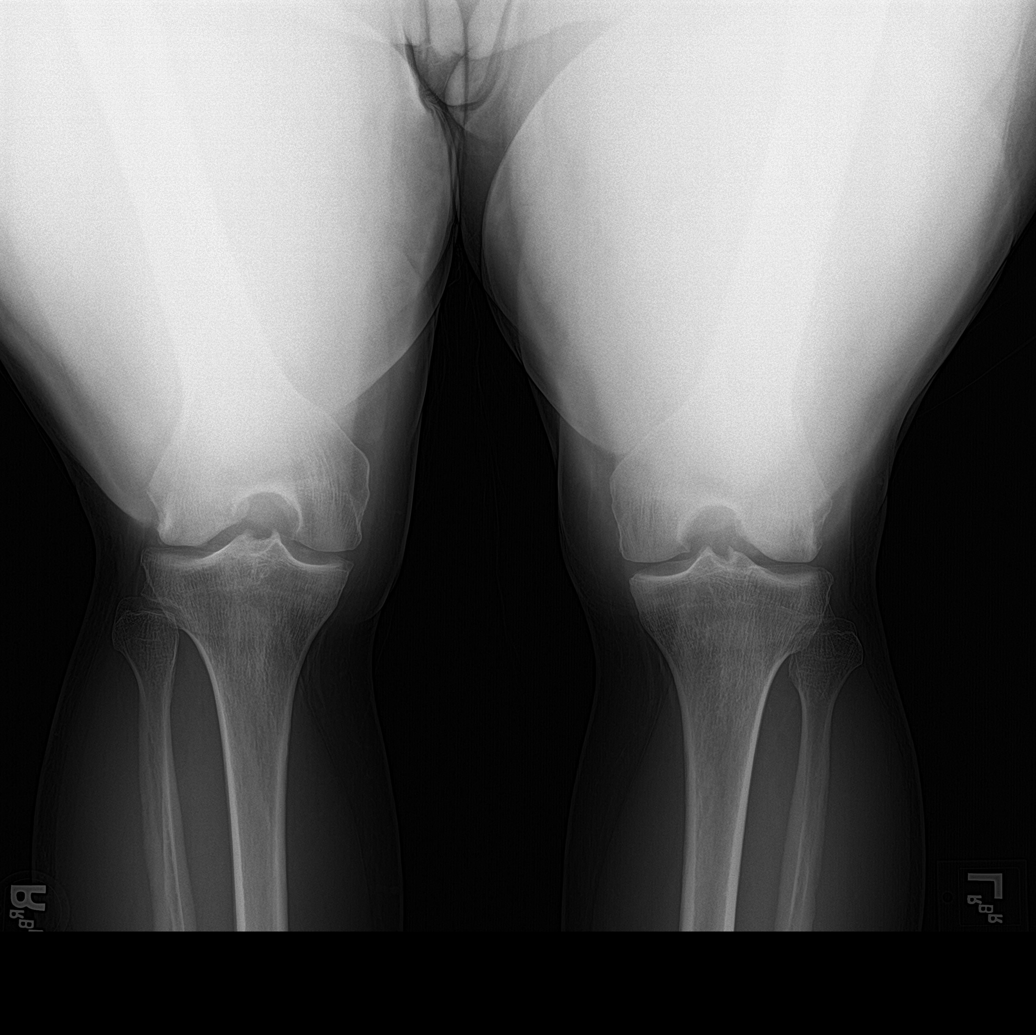
[im 2/2]
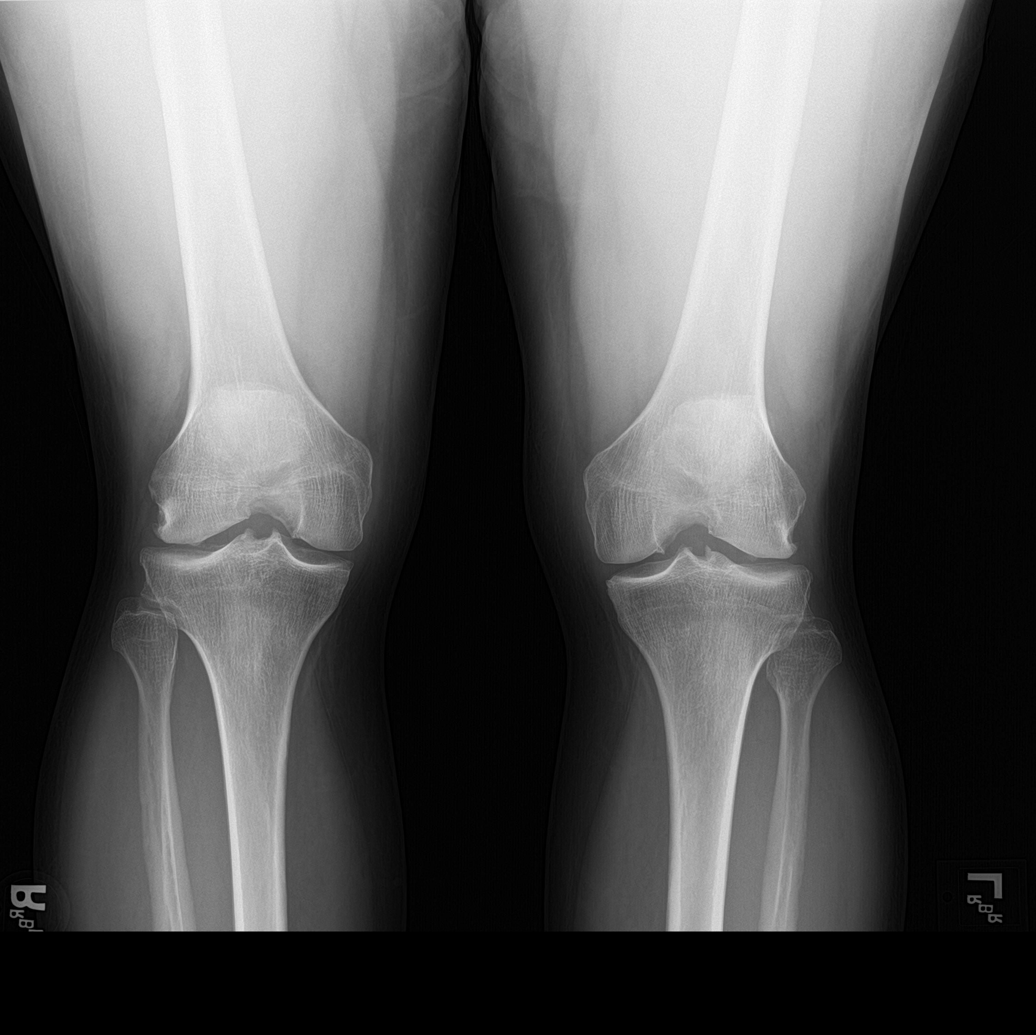

[knee lat]
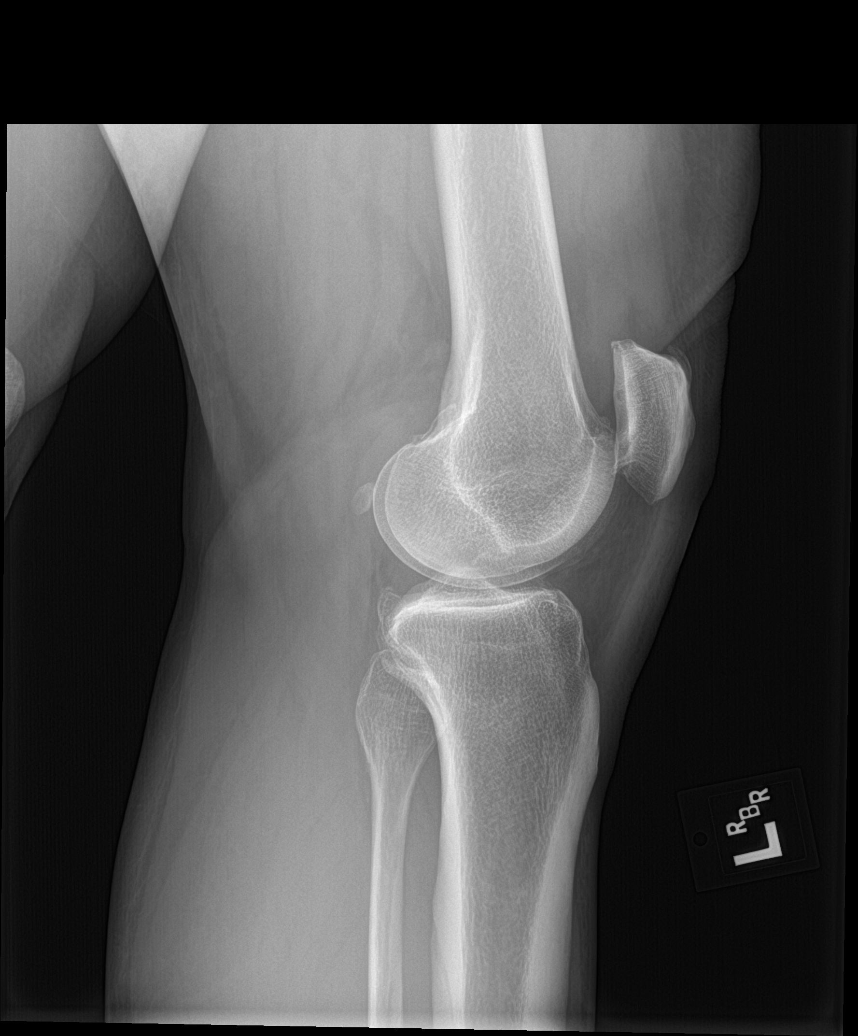

[knee sunrise]
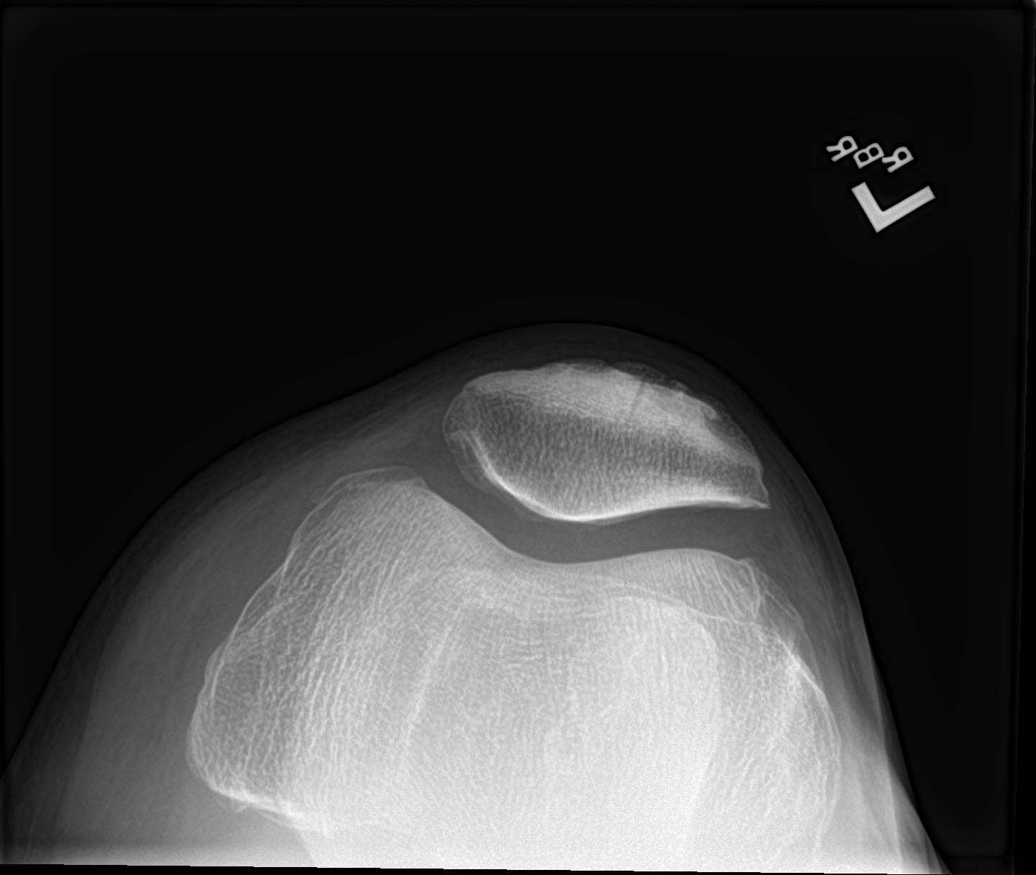

[knee ap bilat standing]
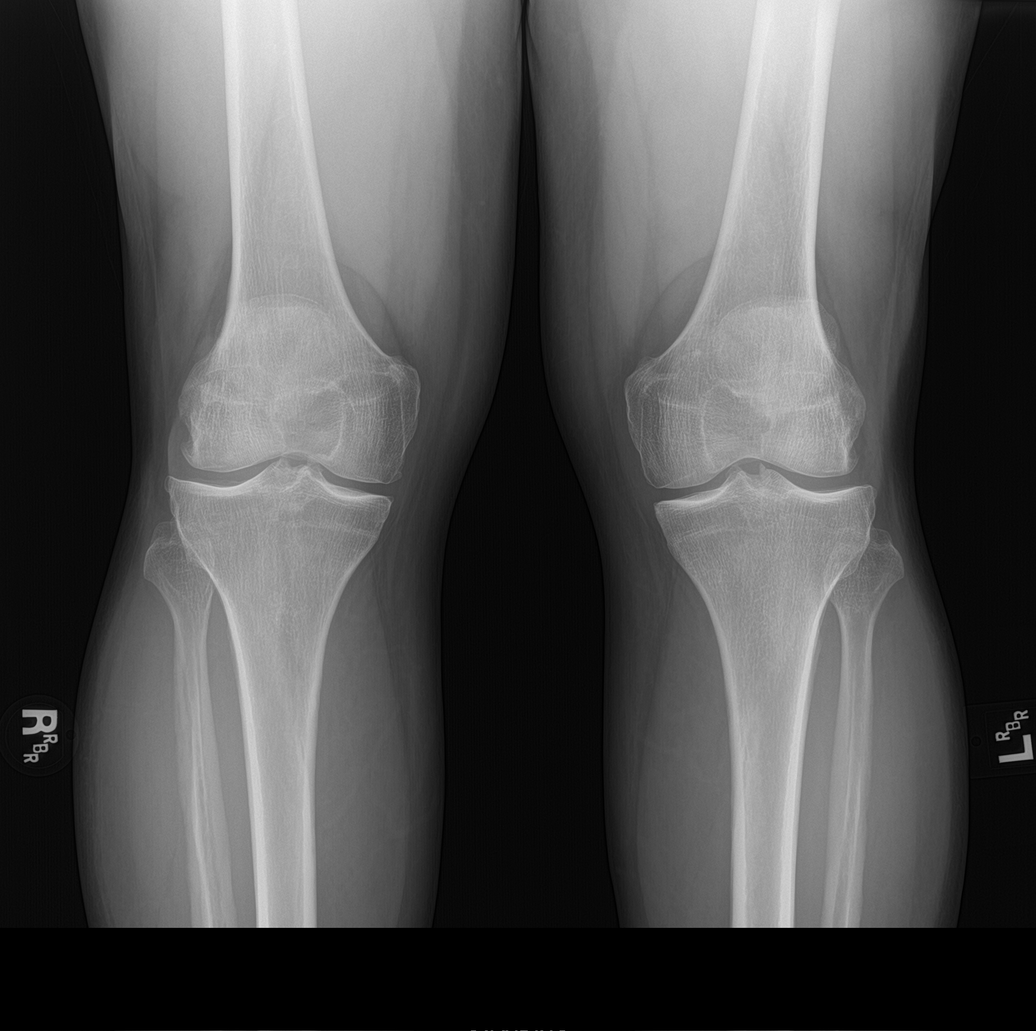

[5 of 5 positions shown; findings below may reference images not displayed]

FINDINGS: Minimal degenerative spurring of the left knee is seen from the
lateral compartment and patella femoral compartment. No fracture is
noted. A small amount of left knee joint fluid cannot be excluded.
IMPRESSION: Mild degenerative change of the left knee with some spurring present
as well as a questionable tiny amount of fluid. No acute
abnormality.

## 2018-01-31 ENCOUNTER — Ambulatory Visit (INDEPENDENT_AMBULATORY_CARE_PROVIDER_SITE_OTHER): Payer: BLUE CROSS/BLUE SHIELD | Admitting: Family Medicine

## 2018-01-31 ENCOUNTER — Encounter: Payer: Self-pay | Admitting: Family Medicine

## 2018-01-31 VITALS — BP 124/72 | HR 68 | Ht 68.0 in | Wt 223.0 lb

## 2018-01-31 DIAGNOSIS — F320 Major depressive disorder, single episode, mild: Secondary | ICD-10-CM | POA: Diagnosis not present

## 2018-01-31 DIAGNOSIS — S39011A Strain of muscle, fascia and tendon of abdomen, initial encounter: Secondary | ICD-10-CM | POA: Diagnosis not present

## 2018-01-31 DIAGNOSIS — F900 Attention-deficit hyperactivity disorder, predominantly inattentive type: Secondary | ICD-10-CM | POA: Diagnosis not present

## 2018-01-31 NOTE — Progress Notes (Signed)
Scott [name] a 52 y.o. male who [facility] today for abdominal pain.  Scott LericheMark was in his normal state of health about 9 days ago.  He was bowling and towards the end of his bowling session felt a pulling pop sensation in his right lower quadrant of his abdomen.  He notes this was initially painful and is slowly improving.  He notes pain with coughing and sneezing has slowly improved.  Pain is worse with activity better with rest.  No bowel bladder dysfunction fevers or chills vomiting or diarrhea.  He denies any bulging in his abdomen.  He did see his get a pertinent past surgical history for open appendectomy as a adolescent.   ROS as above:  Exam:  BP 124/72   Pulse 68   Ht 5\' 8"  (1.727 m)   Wt 223 lb (101.2 kg)   BMI 33.91 kg/m  Wt Readings from Last 5 Encounters:  01/31/18 223 lb (101.2 kg)  01/02/18 220 lb (99.8 kg)  09/14/17 204 lb 6.4 oz (92.7 kg)  09/03/17 208 lb (94.3 kg)  03/03/17 221 lb (100.2 kg)    Gen: Well NAD HEENT: EOMI,  MMM Lungs: Normal work of breathing. CTABL Heart: RRR no MRG Abd: NABS, Soft. Nondistended, well-appearing midline scar.  Mildly tender palpation right lower quadrant.  Pain is worse with flexion of abdominal wall musculature. Exts: Brisk capillary refill, warm and well perfused.  Genitals: Testicles descended bilaterally nontender with no herniation.  Lab and Radiology Results No results found for this or any previous visit (from the past 72 hour(s)). No results found.    Assessment and Plan: 52 y.o. male with abdominal wall strain likely oblique musculature.  And for home exercise program and relative rest.  Advance activity as slowly as tolerated.  Watchful waiting   No orders of the defined types were placed in this encounter.  No orders of the defined types were placed in this  encounter.    Historical information moved to improve visibility of documentation.  Past Medical History:  Diagnosis Date  . ADHD (attention deficit hyperactivity disorder)   . Appendicitis 1998  . Asthma    Exercise Induced  . Fracture of wrist 1984  . Fracture, maxillary ([facility]) 2009  . Low testosterone 09/15/2017  . PONV (postoperative nausea and vomiting)   . Prediabetes 09/15/2017  . Staph infection 2000 and 2002   staph infections post surgeries appe and vasectomy  . Thyroid disease    Past Surgical History:  Procedure Laterality Date  . APPENDECTOMY    . CHONDROPLASTY Right 12/17/2016   Procedure: CHONDROPLASTY;  Surgeon: Loreta AveMurphy, Daniel F, MD;  Location: [facility];  Service: Orthopedics;  Laterality: Right;  . KNEE ARTHROSCOPY Right 12/17/2016   Procedure: KNEE ARTHROSCOPY, REMOVAL OF LOOSE BODIES;  Surgeon: Loreta AveMurphy, Daniel F, MD;  Location: [facility];  Service: Orthopedics;  Laterality: Right;   Social History   Tobacco Use  . Smoking status: Never Smoker  . Smokeless tobacco: Never Used  Substance Use Topics  . Alcohol use: No    Alcohol/week: 0.5 standard drinks    Types: 1 Glasses of wine per week    Comment: One drink per week.   family history includes Alcohol abuse in his maternal aunt and maternal uncle; Diabetes in his mother; Emphysema in his father; Heart attack in his father; Heart failure in his father; Hypertension in his mother.  Medications: Current Outpatient Medications  Medication Sig Dispense Refill  . albuterol (PROAIR HFA) 108 (90 Base) MCG/ACT inhaler Inhale 2 puffs into the lungs every 6 (six) hours as needed for wheezing or shortness of breath. 8.5 Inhaler 11  . cholecalciferol (VITAMIN D) 1000 units tablet Take 4,000 Units by mouth daily.    Marland Kitchen dexmethylphenidate (FOCALIN XR) 20 MG 24 hr capsule     . diazepam (VALIUM) 10 MG tablet Take 1 tablet (10 mg total) by mouth at bedtime as needed for anxiety. 30 tablet  3  . L-ARGININE PO Take by mouth daily.    Marland Kitchen MAGNESIUM CITRATE PO Take 250 mg by mouth.    . Melatonin 3 MG TABS Take 3 mg by mouth.    . Omega-3 Fatty Acids (FISH OIL) 1000 MG CAPS Take 1,000 mg by mouth.    . ondansetron (ZOFRAN) 4 MG tablet Take 1 tablet (4 mg total) by mouth every 8 (eight) hours as needed for nausea or vomiting. 40 tablet 0  . promethazine (PHENERGAN) 25 MG tablet Take 0.5-1 tablets (12.5-25 mg total) by mouth every 6 (six) hours as needed for nausea or vomiting. 30 tablet 0  . testosterone enanthate (DELATESTRYL) 200 MG/ML injection Inject into the muscle every 14 (fourteen) days. For IM use only    . Thyroid (NATURE-THROID) 113.75 MG TABS Take 112 mcg by mouth.     No current facility-administered medications for this visit.    Allergies  Allergen Reactions  . Naproxen Nausea And Vomiting     Discussed warning signs or symptoms. Please see discharge instructions. Patient expresses understanding.

## 2018-01-31 NOTE — Patient Instructions (Signed)
Thank you for coming in today. I think you injured your oblique muscle  Ok to advance activity as tolerated.  At its worse this can be a sports hernia.   Recheck with me as needed.

## 2018-02-04 DIAGNOSIS — E039 Hypothyroidism, unspecified: Secondary | ICD-10-CM | POA: Diagnosis not present

## 2018-02-04 DIAGNOSIS — E291 Testicular hypofunction: Secondary | ICD-10-CM | POA: Diagnosis not present

## 2018-02-04 DIAGNOSIS — R5383 Other fatigue: Secondary | ICD-10-CM | POA: Diagnosis not present

## 2018-02-04 DIAGNOSIS — R7303 Prediabetes: Secondary | ICD-10-CM | POA: Diagnosis not present

## 2018-02-25 DIAGNOSIS — F329 Major depressive disorder, single episode, unspecified: Secondary | ICD-10-CM | POA: Diagnosis not present

## 2018-02-25 DIAGNOSIS — R7989 Other specified abnormal findings of blood chemistry: Secondary | ICD-10-CM | POA: Diagnosis not present

## 2018-02-25 DIAGNOSIS — E7212 Methylenetetrahydrofolate reductase deficiency: Secondary | ICD-10-CM | POA: Diagnosis not present

## 2018-02-25 DIAGNOSIS — E039 Hypothyroidism, unspecified: Secondary | ICD-10-CM | POA: Diagnosis not present

## 2018-03-07 DIAGNOSIS — F320 Major depressive disorder, single episode, mild: Secondary | ICD-10-CM | POA: Diagnosis not present

## 2018-03-07 DIAGNOSIS — F900 Attention-deficit hyperactivity disorder, predominantly inattentive type: Secondary | ICD-10-CM | POA: Diagnosis not present

## 2018-03-29 ENCOUNTER — Other Ambulatory Visit: Payer: Self-pay

## 2018-03-29 ENCOUNTER — Other Ambulatory Visit: Payer: Self-pay | Admitting: Family Medicine

## 2018-03-30 ENCOUNTER — Telehealth: Payer: Self-pay | Admitting: Family Medicine

## 2018-03-30 MED ORDER — DIAZEPAM 10 MG PO TABS: 10.0000 mg | ORAL_TABLET | Freq: Every evening | ORAL | 1 refills | Status: DC | PRN

## 2018-03-30 MED ORDER — DIAZEPAM 10 MG PO TABS: 10.0000 mg | ORAL_TABLET | Freq: Every evening | ORAL | 3 refills | Status: DC | PRN

## 2018-03-30 NOTE — Telephone Encounter (Signed)
Refilled Valium electronically

## 2018-04-04 DIAGNOSIS — F900 Attention-deficit hyperactivity disorder, predominantly inattentive type: Secondary | ICD-10-CM | POA: Diagnosis not present

## 2018-04-04 DIAGNOSIS — F320 Major depressive disorder, single episode, mild: Secondary | ICD-10-CM | POA: Diagnosis not present

## 2018-04-13 ENCOUNTER — Ambulatory Visit (INDEPENDENT_AMBULATORY_CARE_PROVIDER_SITE_OTHER): Payer: BLUE CROSS/BLUE SHIELD | Admitting: Family Medicine

## 2018-04-13 VITALS — BP 121/81 | HR 74 | Temp 97.4°F | Wt 235.0 lb

## 2018-04-13 DIAGNOSIS — Z23 Encounter for immunization: Secondary | ICD-10-CM | POA: Diagnosis not present

## 2018-04-13 NOTE — Progress Notes (Signed)
Pt in today for Hep vaccine this is the 3rd of the series. Injection given in left deltoid, patient tolerated procedure well without complications.

## 2018-05-02 DIAGNOSIS — F900 Attention-deficit hyperactivity disorder, predominantly inattentive type: Secondary | ICD-10-CM | POA: Diagnosis not present

## 2018-05-02 DIAGNOSIS — F320 Major depressive disorder, single episode, mild: Secondary | ICD-10-CM | POA: Diagnosis not present

## 2018-05-30 DIAGNOSIS — F320 Major depressive disorder, single episode, mild: Secondary | ICD-10-CM | POA: Diagnosis not present

## 2018-05-30 DIAGNOSIS — F900 Attention-deficit hyperactivity disorder, predominantly inattentive type: Secondary | ICD-10-CM | POA: Diagnosis not present

## 2018-06-28 ENCOUNTER — Telehealth: Payer: BC Managed Care – PPO | Admitting: Nurse Practitioner

## 2018-06-28 DIAGNOSIS — F900 Attention-deficit hyperactivity disorder, predominantly inattentive type: Secondary | ICD-10-CM | POA: Diagnosis not present

## 2018-06-28 DIAGNOSIS — J0100 Acute maxillary sinusitis, unspecified: Secondary | ICD-10-CM | POA: Diagnosis not present

## 2018-06-28 DIAGNOSIS — F320 Major depressive disorder, single episode, mild: Secondary | ICD-10-CM | POA: Diagnosis not present

## 2018-06-28 MED ORDER — AMOXICILLIN-POT CLAVULANATE 875-125 MG PO TABS: 1.0000 | ORAL_TABLET | Freq: Two times a day (BID) | ORAL | 0 refills | Status: DC

## 2018-06-28 NOTE — Progress Notes (Signed)

## 2018-07-01 ENCOUNTER — Encounter: Payer: Self-pay | Admitting: Sports Medicine

## 2018-07-01 ENCOUNTER — Ambulatory Visit (INDEPENDENT_AMBULATORY_CARE_PROVIDER_SITE_OTHER): Payer: BLUE CROSS/BLUE SHIELD | Admitting: Sports Medicine

## 2018-07-01 DIAGNOSIS — E039 Hypothyroidism, unspecified: Secondary | ICD-10-CM | POA: Diagnosis not present

## 2018-07-01 DIAGNOSIS — B0221 Postherpetic geniculate ganglionitis: Secondary | ICD-10-CM | POA: Insufficient documentation

## 2018-07-01 DIAGNOSIS — R519 Headache, unspecified: Secondary | ICD-10-CM

## 2018-07-01 DIAGNOSIS — H6122 Impacted cerumen, left ear: Secondary | ICD-10-CM

## 2018-07-01 DIAGNOSIS — R51 Headache: Secondary | ICD-10-CM | POA: Diagnosis not present

## 2018-07-01 MED ORDER — HYDROCODONE-ACETAMINOPHEN 5-325 MG PO TABS: 1.0000 | ORAL_TABLET | Freq: Three times a day (TID) | ORAL | 0 refills | Status: DC | PRN

## 2018-07-01 NOTE — Progress Notes (Addendum)
Subjective:    CC: Left facial pain  HPI: This is a pleasant 53 year old male, for the past several days he has had severe pain in the left side of his face, from the ear radiating down to his left mandible, and left shin.  No fevers, chills, no cough, shortness of breath, no GI symptoms, no skin rash.  No sick contacts.  He did a virtual visit and was prescribed Augmentin for a sinus infection.  2 days later he is really not much better.  I reviewed the past medical history, family history, social history, surgical history, and allergies today and no changes were needed.  Please see the problem list section below in epic for further details.  Past Medical History: Past Medical History:  Diagnosis Date   ADHD (attention deficit hyperactivity disorder)    Appendicitis 1998   Asthma    Exercise Induced   Fracture of wrist 1984   Fracture, maxillary (HCC) 2009   Low testosterone 09/15/2017   PONV (postoperative nausea and vomiting)    Prediabetes 09/15/2017   Staph infection 2000 and 2002   staph infections post surgeries appe and vasectomy   Thyroid disease    Past Surgical History: Past Surgical History:  Procedure Laterality Date   APPENDECTOMY     CHONDROPLASTY Right 12/17/2016   Procedure: CHONDROPLASTY;  Surgeon: Loreta Ave, MD;  Location: [facility];  Service: Orthopedics;  Laterality: Right;   KNEE ARTHROSCOPY Right 12/17/2016   Procedure: KNEE ARTHROSCOPY, REMOVAL OF LOOSE BODIES;  Surgeon: Loreta Ave, MD;  Location: [facility];  Service: Orthopedics;  Laterality: Right;   Social History: Social History   Socioeconomic History   Marital status: Married    Spouse name: Not on file   Number of children: Not on file   Years of education: Not on file   Highest education level: Not on file  Occupational History   Not on file  Social Needs   Financial resource strain: Not on file   Food insecurity:    Worry:  Not on file    Inability: Not on file   Transportation needs:    Medical: Not on file    Non-medical: Not on file  Tobacco Use   Smoking status: Never Smoker   Smokeless tobacco: Never Used  Substance and Sexual Activity   Alcohol use: No    Alcohol/week: 0.5 standard drinks    Types: 1 Glasses of wine per week    Comment: One drink per week.   Drug use: No   Sexual activity: Yes    Partners: Female  Lifestyle   Physical activity:    Days per week: 5 days    Minutes per session: 60 min   Stress: Not on file  Relationships   Social connections:    Talks on phone: Not on file    Gets together: Not on file    Attends religious service: Not on file    Active member of club or organization: Not on file    Attends meetings of clubs or organizations: Not on file    Relationship status: Not on file  Other Topics Concern   Not on file  Social History Narrative   Not on file   Family History: Family History  Problem Relation Age of Onset   Heart attack Father    Emphysema Father    Heart failure Father    Alcohol abuse Maternal Aunt    Alcohol abuse Maternal Uncle  Diabetes Mother    Hypertension Mother    Allergies: Allergies  Allergen Reactions   Naproxen Nausea And Vomiting   Medications: See med rec.  Review of Systems: No fevers, chills, night sweats, weight loss, chest pain, or shortness of breath.   Objective:    General: Well Developed, well nourished, and in no acute distress.  Neuro: Alert and oriented x3, extra-ocular muscles intact, sensation grossly intact.  HEENT: Normocephalic, atraumatic, pupils equal round reactive to light, neck supple, no masses, no lymphadenopathy, thyroid nonpalpable.  Visible left-sided facial swelling, tenderness over the parotid and submandibular glands, left-sided external ear canal is completely occluded with cerumen. Skin: Warm and dry, no rashes. Cardiac: Regular rate and rhythm, no murmurs rubs or  gallops, no lower extremity edema.  Respiratory: Clear to auscultation bilaterally. Not using accessory muscles, speaking in full sentences.  Indication: Cerumen impaction of the left ear(s) Medical necessity statement: On physical examination, cerumen impairs clinically significant portions of the external auditory canal, and tympanic membrane. Noted obstructive, copious cerumen that cannot be removed without magnification and instrumentations requiring physician skills Consent: Discussed benefits and risks of procedure and verbal consent obtained Procedure: Patient was prepped for the procedure. Utilized an otoscope to assess and take note of the ear canal, the tympanic membrane, and the presence, amount, and placement of the cerumen. Gentle water irrigation and soft plastic curette was utilized to remove cerumen.  Post procedure examination: shows cerumen was completely removed. Patient tolerated procedure well. The patient is made aware that they may experience temporary vertigo, temporary hearing loss, and temporary discomfort. If these symptom last for more than 24 hours to call the clinic or proceed to the ED.  Impression and Recommendations:    Left facial pain Left-sided cerumen impaction necessitating instrumentation and irrigation. He also has tenderness over the parotid and submandibular glands. Adding 5 days of prednisone, hydrocodone for the pain. Finish Augmentin prescribed during E visit. Adding labs including mumps IgM and IgG.  Adult hypothyroidism TSH is high, please add T3 and T4 levels to blood already in the lab.  Orders placed.  [facility].   ___________________________________________ Ihor Austinhomas J. Benjamin Stainhekkekandam, M.D., ABFM., CAQSM. Primary Care and Sports Medicine [facility]  Adjunct Professor of Family Medicine  [employer]

## 2018-07-01 NOTE — Assessment & Plan Note (Signed)
Left-sided cerumen impaction necessitating instrumentation and irrigation. He also has tenderness over the parotid and submandibular glands. Adding 5 days of prednisone, hydrocodone for the pain. Finish Augmentin prescribed during E visit. Adding labs including mumps IgM and IgG.

## 2018-07-02 ENCOUNTER — Encounter: Payer: Self-pay | Admitting: Sports Medicine

## 2018-07-02 DIAGNOSIS — Z79899 Other long term (current) drug therapy: Secondary | ICD-10-CM | POA: Diagnosis not present

## 2018-07-02 DIAGNOSIS — K1120 Sialoadenitis, unspecified: Secondary | ICD-10-CM | POA: Diagnosis not present

## 2018-07-02 DIAGNOSIS — K1121 Acute sialoadenitis: Secondary | ICD-10-CM | POA: Diagnosis not present

## 2018-07-02 DIAGNOSIS — Z886 Allergy status to analgesic agent status: Secondary | ICD-10-CM | POA: Diagnosis not present

## 2018-07-02 DIAGNOSIS — E039 Hypothyroidism, unspecified: Secondary | ICD-10-CM | POA: Diagnosis not present

## 2018-07-02 DIAGNOSIS — H9202 Otalgia, left ear: Secondary | ICD-10-CM | POA: Diagnosis not present

## 2018-07-03 MED ORDER — ONDANSETRON 8 MG PO TBDP: 8.0000 mg | ORAL_TABLET | Freq: Three times a day (TID) | ORAL | 3 refills | Status: DC | PRN

## 2018-07-03 NOTE — Assessment & Plan Note (Signed)
TSH is high, please add T3 and T4 levels to blood already in the lab.  Orders placed.  Patient prefers to keep thyroid management at [facility].

## 2018-07-03 NOTE — Addendum Note (Signed)
Addended by: Monica Becton on: 07/03/2018 01:37 PM   Modules accepted: Orders

## 2018-07-04 DIAGNOSIS — H9202 Otalgia, left ear: Secondary | ICD-10-CM | POA: Diagnosis not present

## 2018-07-04 DIAGNOSIS — K119 Disease of salivary gland, unspecified: Secondary | ICD-10-CM | POA: Diagnosis not present

## 2018-07-04 DIAGNOSIS — H6121 Impacted cerumen, right ear: Secondary | ICD-10-CM | POA: Diagnosis not present

## 2018-07-06 LAB — CBC WITH DIFFERENTIAL/PLATELET
Absolute Monocytes: 479 cells/uL (ref 200–950)
Basophils Absolute: 38 {cells}/uL (ref 0–200)
Basophils Relative: 0.9 %
Eosinophils Absolute: 160 {cells}/uL (ref 15–500)
Eosinophils Relative: 3.8 %
HCT: 45.2 % (ref 38.5–50.0)
Hemoglobin: 15.3 g/dL (ref 13.2–17.1)
Lymphs Abs: 827 cells/uL — ABNORMAL LOW (ref 850–3900)
MCH: 30.5 pg (ref 27.0–33.0)
MCHC: 33.8 g/dL (ref 32.0–36.0)
MCV: 90 fL (ref 80.0–100.0)
MPV: 10.3 fL (ref 7.5–12.5)
Monocytes Relative: 11.4 %
Neutro Abs: 2696 cells/uL (ref 1500–7800)
Neutrophils Relative %: 64.2 %
Platelets: 158 10*3/uL (ref 140–400)
RBC: 5.02 10*6/uL (ref 4.20–5.80)
RDW: 12.6 % (ref 11.0–15.0)
Total Lymphocyte: 19.7 %
WBC: 4.2 10*3/uL (ref 3.8–10.8)

## 2018-07-06 LAB — TEST AUTHORIZATION

## 2018-07-06 LAB — COMPLETE METABOLIC PANEL WITH GFR
AG Ratio: 1.8 (calc) (ref 1.0–2.5)
ALT: 25 U/L (ref 9–46)
AST: 21 U/L (ref 10–35)
Albumin: 4.3 g/dL (ref 3.6–5.1)
Alkaline phosphatase (APISO): 69 U/L (ref 35–144)
BUN: 16 mg/dL (ref 7–25)
CO2: 26 mmol/L (ref 20–32)
Calcium: 9.1 mg/dL (ref 8.6–10.3)
Chloride: 104 mmol/L (ref 98–110)
Creat: 1.21 mg/dL (ref 0.70–1.33)
GFR, Est African American: 78 mL/min/{1.73_m2} (ref >=60)
GFR, Est Non African American: 67 mL/min/{1.73_m2} (ref >=60)
Globulin: 2.4 g/dL (calc) (ref 1.9–3.7)
Glucose, Bld: 138 mg/dL (ref 65–139)
Potassium: 4.6 mmol/L (ref 3.5–5.3)
Sodium: 140 mmol/L (ref 135–146)
Total Bilirubin: 0.5 mg/dL (ref 0.2–1.2)
Total Protein: 6.7 g/dL (ref 6.1–8.1)

## 2018-07-06 LAB — T4, FREE: Free T4: 1 ng/dL (ref 0.8–1.8)

## 2018-07-06 LAB — MUMPS ANTIBODY, IGM: Mumps IgM Value: <1:20 {titer}

## 2018-07-06 LAB — MUMPS ANTIBODY, IGG: Mumps IgG: 245 AU/mL

## 2018-07-06 LAB — TSH: TSH: 5.36 mIU/L — ABNORMAL HIGH (ref 0.40–4.50)

## 2018-07-06 LAB — T3, FREE: T3, Free: 3.6 pg/mL (ref 2.3–4.2)

## 2018-07-08 ENCOUNTER — Ambulatory Visit (INDEPENDENT_AMBULATORY_CARE_PROVIDER_SITE_OTHER): Payer: BLUE CROSS/BLUE SHIELD | Admitting: Sports Medicine

## 2018-07-08 ENCOUNTER — Encounter: Payer: Self-pay | Admitting: Sports Medicine

## 2018-07-08 DIAGNOSIS — R519 Headache, unspecified: Secondary | ICD-10-CM

## 2018-07-08 DIAGNOSIS — B0221 Postherpetic geniculate ganglionitis: Secondary | ICD-10-CM | POA: Diagnosis not present

## 2018-07-08 DIAGNOSIS — R51 Headache: Secondary | ICD-10-CM | POA: Diagnosis not present

## 2018-07-08 MED ORDER — VALACYCLOVIR HCL 1 G PO TABS: 1000.0000 mg | ORAL_TABLET | Freq: Two times a day (BID) | ORAL | 2 refills | Status: DC

## 2018-07-08 MED ORDER — PREDNISONE 50 MG PO TABS: ORAL_TABLET | ORAL | 0 refills | Status: DC

## 2018-07-08 MED ORDER — PREGABALIN 75 MG PO CAPS: 75.0000 mg | ORAL_CAPSULE | Freq: Two times a day (BID) | ORAL | 3 refills | Status: DC

## 2018-07-08 NOTE — Assessment & Plan Note (Addendum)
Did not improve very much with aggressive cerumen impaction removal, did not do the prednisone. I would like him to go ahead and do the prednisone, he really did not have any abnormalities on exam and his parotid or submandibular glands. Mumps antibody test indicates immunity. Antibiotics have not been effective. We also need to consider trigeminal neuralgia, TMJ dysfunction in the differential. In addition he has developed a crusting grouped vesicular rash in front of his left ear, he also has somewhat of a rash inside the left internal canal, this is all suspicious for Ramsay Hunt syndrome. In addition he does have a bit of left facial droop, as well as tinnitus, he had a decrease in taste and complete loss of hearing last week. Adding Valtrex, Lyrica, and he does need to take his prednisone. If insufficient improvement after a couple of weeks then we will proceed with MRI of his brain and likely CT of his maxillofacial structures.

## 2018-07-08 NOTE — Progress Notes (Signed)
Subjective:    CC: Follow-up  HPI: Scott Fields returns, we have been treating him for severe left facial and ear pain.  This is been going on now for several weeks.  Initially suspected to be secondary to a severe cerumen impaction, we cleared the cerumen, continued to have pain.  He ultimately ended up in the emergency department, they suspected a salivary gland process, he was not getting much better on Augmentin, clindamycin was added but he did not start this medication.  He was referred to ENT, they cleaned out the external canal, unfortunately he has persistent pain.  On further questioning he has noted a bit of slight droop of the left side of the face, he has had some difficulties with taste, and he lost his hearing for a period of time last week.  I reviewed the past medical history, family history, social history, surgical history, and allergies today and no changes were needed.  Please see the problem list section below in epic for further details.  Past Medical History: Past Medical History:  Diagnosis Date  . ADHD (attention deficit hyperactivity disorder)   . Appendicitis 1998  . Asthma    Exercise Induced  . Fracture of wrist 1984  . Fracture, maxillary (HCC) 2009  . Low testosterone 09/15/2017  . PONV (postoperative nausea and vomiting)   . Prediabetes 09/15/2017  . Staph infection 2000 and 2002   staph infections post surgeries appe and vasectomy  . Thyroid disease    Past Surgical History: Past Surgical History:  Procedure Laterality Date  . APPENDECTOMY    . CHONDROPLASTY Right 12/17/2016   Procedure: CHONDROPLASTY;  Surgeon: Loreta AveMurphy, Daniel F, MD;  Location: [facility];  Service: Orthopedics;  Laterality: Right;  . KNEE ARTHROSCOPY Right 12/17/2016   Procedure: KNEE ARTHROSCOPY, REMOVAL OF LOOSE BODIES;  Surgeon: Loreta AveMurphy, Daniel F, MD;  Location: [facility];  Service: Orthopedics;  Laterality: Right;   Social History: Social History    Socioeconomic History  . Marital status: Married    Spouse name: Not on file  . Number of children: Not on file  . Years of education: Not on file  . Highest education level: Not on file  Occupational History  . Not on file  Social Needs  . Financial resource strain: Not on file  . Food insecurity:    Worry: Not on file    Inability: Not on file  . Transportation needs:    Medical: Not on file    Non-medical: Not on file  Tobacco Use  . Smoking status: Never Smoker  . Smokeless tobacco: Never Used  Substance and Sexual Activity  . Alcohol use: No    Alcohol/week: 0.5 standard drinks    Types: 1 Glasses of wine per week    Comment: One drink per week.  . Drug use: No  . Sexual activity: Yes    Partners: Female  Lifestyle  . Physical activity:    Days per week: 5 days    Minutes per session: 60 min  . Stress: Not on file  Relationships  . Social connections:    Talks on phone: Not on file    Gets together: Not on file    Attends religious service: Not on file    Active member of club or organization: Not on file    Attends meetings of clubs or organizations: Not on file    Relationship status: Not on file  Other Topics Concern  . Not on file  Social History Narrative  . Not on file   Family History: Family History  Problem Relation Age of Onset  . Heart attack Father   . Emphysema Father   . Heart failure Father   . Alcohol abuse Maternal Aunt   . Alcohol abuse Maternal Uncle   . Diabetes Mother   . Hypertension Mother    Allergies: Allergies  Allergen Reactions  . Naproxen Nausea And Vomiting   Medications: See med rec.  Review of Systems: No fevers, chills, night sweats, weight loss, chest pain, or shortness of breath.   Objective:    General: Well Developed, well nourished, and in no acute distress.  Neuro: Alert and oriented x3, extra-ocular muscles intact, sensation grossly intact.  HEENT: Normocephalic, atraumatic, pupils equal round reactive  to light, neck supple, no masses, no lymphadenopathy, thyroid nonpalpable.  Left external canal is a bit crusty, but no further cerumen, right canal still has a tiny bit of cerumen that is nonobstructive. Skin: Warm and dry, as in the picture below he did develop a rash that is scabbing over in front of his left ear. Cardiac: Regular rate and rhythm, no murmurs rubs or gallops, no lower extremity edema.  Respiratory: Clear to auscultation bilaterally. Not using accessory muscles, speaking in full sentences.    Impression and Recommendations:    Ramsay Hunt syndrome (geniculate herpes zoster) Did not improve very much with aggressive cerumen impaction removal, did not do the prednisone. I would like him to go ahead and do the prednisone, he really did not have any abnormalities on exam and his parotid or submandibular glands. Mumps antibody test indicates immunity. Antibiotics have not been effective. We also need to consider trigeminal neuralgia, TMJ dysfunction in the differential. In addition he has developed a crusting grouped vesicular rash in front of his left ear, he also has somewhat of a rash inside the left internal canal, this is all suspicious for Ramsay Hunt syndrome. In addition he does have a bit of left facial droop, as well as tinnitus, he had a decrease in taste and complete loss of hearing last week. Adding Valtrex, Lyrica, and he does need to take his prednisone. If insufficient improvement after a couple of weeks then we will proceed with MRI of his brain and likely CT of his maxillofacial structures.   ___________________________________________ Scott Fields. [name], M.D., ABFM., CAQSM. Primary Care and Sports Medicine [facility]  Adjunct Professor of Family Medicine  [employer]

## 2018-07-08 NOTE — Patient Instructions (Signed)
Ramsay Hunt Syndrome    Ramsay Hunt syndrome (RHS) is a viral infection that affects the nerves in the face and the nerves near the inner ear. The infection is caused by the varicella zoster virus (VZV). This is the same virus that causes chickenpox and shingles.  After a person has chickenpox, this virus may become inactive. Years later, the virus can become active again and cause Ramsay Hunt syndrome. The trigger may be something that weakens the body's defense system (immune system), like stress.  When VZV becomes activated, it moves up the facial nerve and causes a painful rash in or around the ear canal. It may also travel up the nerve that is responsible for hearing. Ramsay Hunt syndrome cannot be passed from person to person (it is not contagious). However, if a person who has never had chickenpox comes in contact with fluid from someone's skin blisters, he or she may develop chickenpox.  What are the causes?  This condition is caused by the varicella zoster virus.  What increases the risk?  You may be at risk for Ramsay Hunt syndrome if you have had chickenpox in the past.  What are the signs or symptoms?  Symptoms of this condition include:   A rash with blisters that breaks out around the ear. The rash may be accompanied by:  ? A rash in the inner ear, along the side of the face, or up the scalp.  ? A rash inside the mouth.  ? Deep and severe pain in the ear.  ? Severe, burning pain wherever the rash develops.   Facial nerve weakness. This may cause:  ? Drooping on one side of the face.  ? Inability to close the eyelid on the affected side of the face.  ? Trouble eating.  ? Loss of ability to taste on the side of the tongue.  If RHS affects the inner ear nerve (auditory nerve), other symptoms may be present. These may include:   Hearing loss.   A spinning sensation (vertigo).   Clumsiness.   Ringing in the ear (tinnitus).  How is this diagnosed?  This condition may be diagnosed based on:   Your  symptoms.   A physical exam.   Other tests to confirm the diagnosis. These may include:  ? Viral culture. This test is done by swabbing the rash or blister to check for VZV.  ? Blood tests to check for antibodies to VZV. Antibodies are proteins that your body produces in response to germs.  ? Nerve conduction studies (electroneurogram).  ? MRI scan.  ? Hearing tests (audiology).  How is this treated?  This condition may be treated with:   An antiviral medicine to treat the virus.   NSAIDs or prescription pain relievers to control pain.   An anti-inflammatory medicine (steroid) called prednisone.   Antibiotic medicine, if the rash becomes infected.  If treatment starts within the first 3 days of having symptoms, it will shorten the course of the pain and rash that is caused by RHS. Treatment will also prevent your facial nerve from continuing to weaken. Without treatment, it is possible that you may not recover full use of your facial nerve.  Follow these instructions at home:  Medicines   Take over-the-counter and prescription medicines only as told by your health care provider.   If you were prescribed an antibiotic or antiviral medicine, take or apply it as told by your health care provider. Do not stop using the antibiotic or antiviral   medicine even if your condition improves.   Do not drive or use heavy machinery while taking prescription pain medicine.   If you are taking prescription pain medicine, take actions to prevent or treat constipation. Your health care provider may recommend that you:  ? Drink enough fluid to keep your urine pale yellow.  ? Eat foods that are high in fiber, such as fresh fruits and vegetables, whole grains, and beans.  ? Limit foods that are high in fat and processed sugars, such as fried or sweet foods.  ? Take an over-the-counter or prescription medicine for constipation.  General instructions   If told by your health care provider, use artificial tears and wear an eye  patch to protect your eye until you can close your eyelid again.   Do not scratch or pick at the rash.   Put a cold, wet cloth (cold compress) on the itchy area as told by your health care provider.   If you have blisters in your mouth, do not eat or drink spicy, salty, or acidic things. Soft, bland, and cold foods and beverages are easiest to swallow.   Keep all follow-up visits as told by your health care provider. This is important.  Contact a health care provider if:   Your pain medicine is not helping.   You have chills or fever.   Your symptoms get worse.   Your symptoms have not gone away after 2 weeks.   You have any changes in vision.  Summary   Ramsay Hunt syndrome (RHS) is a viral infection that affects the nerves in the face and the nerves near the inner ear. The infection is caused by the varicella zoster virus (VZV), which also causes chicken pox and shingles.   After a person has chickenpox, this virus may become inactive. If the inactive VZV virus becomes activated, it moves up the facial nerve and causes a painful rash in or around the ear canal. It can also cause hearing loss and facial nerve weakness, with drooping on one side of the face.   If treatment starts within the first 3 days of having symptoms, it will shorten the course of the pain and rash that are caused by RHS. Treatment will also prevent your facial nerve from continuing to weaken.   Treatments may include antiviral, steroid, and pain medicines.  This information is not intended to replace advice given to you by your health care provider. Make sure you discuss any questions you have with your health care provider.  Document Released: 01/30/2002 Document Revised: 03/25/2017 Document Reviewed: 03/25/2017  Elsevier Interactive Patient Education  2019 [employer] Inc[employer]

## 2018-07-22 ENCOUNTER — Ambulatory Visit (INDEPENDENT_AMBULATORY_CARE_PROVIDER_SITE_OTHER): Payer: BLUE CROSS/BLUE SHIELD | Admitting: Sports Medicine

## 2018-07-22 ENCOUNTER — Encounter: Payer: Self-pay | Admitting: Sports Medicine

## 2018-07-22 DIAGNOSIS — R5383 Other fatigue: Secondary | ICD-10-CM | POA: Diagnosis not present

## 2018-07-22 DIAGNOSIS — R7303 Prediabetes: Secondary | ICD-10-CM | POA: Diagnosis not present

## 2018-07-22 DIAGNOSIS — B0221 Postherpetic geniculate ganglionitis: Secondary | ICD-10-CM

## 2018-07-22 DIAGNOSIS — E291 Testicular hypofunction: Secondary | ICD-10-CM | POA: Diagnosis not present

## 2018-07-22 DIAGNOSIS — E039 Hypothyroidism, unspecified: Secondary | ICD-10-CM | POA: Diagnosis not present

## 2018-07-22 NOTE — Assessment & Plan Note (Signed)
Scott [name] finally better. I noticed a rash in his auditory canal, as well as in front of his ear so we diagnosed him with Ramsay Hunt syndrome. I started Valtrex, prednisone, and his symptoms resolved at night. His hearing has returned, tinnitus has improved, and his taste has improved as well. He no longer has any facial droop. He can return to see his PCP in the next few months for Shingrix series. He has stopped his Lyrica. Otherwise return to see me as needed.

## 2018-07-22 NOTE — Progress Notes (Signed)
Subjective:    CC: Follow-up  HPI: We diagnosed [name] with Ramsay Hunt syndrome at the last visit, after 1 day of prednisone and Lyrica his symptoms resolved.  I reviewed the past medical history, family history, social history, surgical history, and allergies today and no changes were needed.  Please see the problem list section below in epic for further details.  Past Medical History: Past Medical History:  Diagnosis Date  . ADHD (attention deficit hyperactivity disorder)   . Appendicitis 1998  . Asthma    Exercise Induced  . Fracture of wrist 1984  . Fracture, maxillary (HCC) 2009  . Low testosterone 09/15/2017  . PONV (postoperative nausea and vomiting)   . Prediabetes 09/15/2017  . Staph infection 2000 and 2002   staph infections post surgeries appe and vasectomy  . Thyroid disease    Past Surgical History: Past Surgical History:  Procedure Laterality Date  . APPENDECTOMY    . CHONDROPLASTY Right 12/17/2016   Procedure: CHONDROPLASTY;  Surgeon: Loreta AveMurphy, Daniel F, MD;  Location: [facility];  Service: Orthopedics;  Laterality: Right;  . KNEE ARTHROSCOPY Right 12/17/2016   Procedure: KNEE ARTHROSCOPY, REMOVAL OF LOOSE BODIES;  Surgeon: Loreta AveMurphy, Daniel F, MD;  Location: [facility];  Service: Orthopedics;  Laterality: Right;   Social History: Social History   Socioeconomic History  . Marital status: Married    Spouse name: Not on file  . Number of children: Not on file  . Years of education: Not on file  . Highest education level: Not on file  Occupational History  . Not on file  Social Needs  . Financial resource strain: Not on file  . Food insecurity:    Worry: Not on file    Inability: Not on file  . Transportation needs:    Medical: Not on file    Non-medical: Not on file  Tobacco Use  . Smoking status: Never Smoker  . Smokeless tobacco: Never Used  Substance and Sexual Activity  . Alcohol use: No    Alcohol/week: 0.5  standard drinks    Types: 1 Glasses of wine per week    Comment: One drink per week.  . Drug use: No  . Sexual activity: Yes    Partners: Female  Lifestyle  . Physical activity:    Days per week: 5 days    Minutes per session: 60 min  . Stress: Not on file  Relationships  . Social connections:    Talks on phone: Not on file    Gets together: Not on file    Attends religious service: Not on file    Active member of club or organization: Not on file    Attends meetings of clubs or organizations: Not on file    Relationship status: Not on file  Other Topics Concern  . Not on file  Social History Narrative  . Not on file   Family History: Family History  Problem Relation Age of Onset  . Heart attack Father   . Emphysema Father   . Heart failure Father   . Alcohol abuse Maternal Aunt   . Alcohol abuse Maternal Uncle   . Diabetes Mother   . Hypertension Mother    Allergies: Allergies  Allergen Reactions  . Naproxen Nausea And Vomiting   Medications: See med rec.  Review of Systems: No fevers, chills, night sweats, weight loss, chest pain, or shortness of breath.   Objective:    General: Well Developed, well nourished, and  in no acute distress.  Neuro: Alert and oriented x3, extra-ocular muscles intact, sensation grossly intact.  Cranial nerves II through XII are intact, specifically no facial droop. HEENT: Normocephalic, atraumatic, pupils equal round reactive to light, neck supple, no masses, no lymphadenopathy, thyroid nonpalpable.  Skin: Warm and dry, no rashes.  Left preauricular rash has resolved, external canal is for the most part clear. Cardiac: Regular rate and rhythm, no murmurs rubs or gallops, no lower extremity edema.  Respiratory: Clear to auscultation bilaterally. Not using accessory muscles, speaking in full sentences.  Impression and Recommendations:    Ramsay Hunt syndrome (geniculate herpes zoster) Scott [name] finally better. I noticed a rash in his  auditory canal, as well as in front of his ear so we diagnosed him with Ramsay Hunt syndrome. I started Valtrex, prednisone, and his symptoms resolved at night. His hearing has returned, tinnitus has improved, and his taste has improved as well. He no longer has any facial droop. He can return to see his PCP in the next few months for Shingrix series. He has stopped his Lyrica. Otherwise return to see me as needed.   ___________________________________________ [name]. [name], M.D., ABFM., CAQSM. Primary Care and Sports Medicine [facility]  Adjunct Professor of Family Medicine  [employer]

## 2018-08-05 DIAGNOSIS — R7303 Prediabetes: Secondary | ICD-10-CM | POA: Diagnosis not present

## 2018-08-05 DIAGNOSIS — F329 Major depressive disorder, single episode, unspecified: Secondary | ICD-10-CM | POA: Diagnosis not present

## 2018-08-05 DIAGNOSIS — E039 Hypothyroidism, unspecified: Secondary | ICD-10-CM | POA: Diagnosis not present

## 2018-08-05 DIAGNOSIS — E7212 Methylenetetrahydrofolate reductase deficiency: Secondary | ICD-10-CM | POA: Diagnosis not present

## 2018-08-08 ENCOUNTER — Ambulatory Visit: Payer: BC Managed Care – PPO | Admitting: Family Medicine

## 2018-08-08 ENCOUNTER — Encounter: Payer: Self-pay | Admitting: Family Medicine

## 2018-08-08 VITALS — BP 134/81 | HR 57 | Temp 98.3°F | Wt 235.0 lb

## 2018-08-08 DIAGNOSIS — R7989 Other specified abnormal findings of blood chemistry: Secondary | ICD-10-CM

## 2018-08-08 DIAGNOSIS — G4730 Sleep apnea, unspecified: Secondary | ICD-10-CM

## 2018-08-08 DIAGNOSIS — E039 Hypothyroidism, unspecified: Secondary | ICD-10-CM

## 2018-08-08 DIAGNOSIS — R5383 Other fatigue: Secondary | ICD-10-CM

## 2018-08-08 DIAGNOSIS — Z1211 Encounter for screening for malignant neoplasm of colon: Secondary | ICD-10-CM

## 2018-08-08 NOTE — Patient Instructions (Addendum)
Thank you for coming in today. You should hear about the sleep study.  Let me know if you do not hear anything about sleep study and then CPAP.  I expect you will improve on CPAP.     Sleep Apnea Sleep apnea is a condition in which breathing pauses or becomes shallow during sleep. Episodes of sleep apnea usually last 10 seconds or longer, and they may occur as many as 20 times an hour. Sleep apnea disrupts your sleep and keeps your body from getting the rest that it needs. This condition can increase your risk of certain health problems, including:  Heart attack.  Stroke.  Obesity.  Diabetes.  Heart failure.  Irregular heartbeat. There are three kinds of sleep apnea:  Obstructive sleep apnea. This kind is caused by a blocked or collapsed airway.  Central sleep apnea. This kind happens when the part of the brain that controls breathing does not send the correct signals to the muscles that control breathing.  Mixed sleep apnea. This is a combination of obstructive and central sleep apnea. What are the causes? The most common cause of this condition is a collapsed or blocked airway. An airway can collapse or become blocked if:  Your throat muscles are abnormally relaxed.  Your tongue and tonsils are larger than normal.  You are overweight.  Your airway is smaller than normal. What increases the risk? This condition is more likely to develop in people who:  Are overweight.  Smoke.  Have a smaller than normal airway.  Are elderly.  Are male.  Drink alcohol.  Take sedatives or tranquilizers.  Have a family history of sleep apnea. What are the signs or symptoms? Symptoms of this condition include:  Trouble staying asleep.  Daytime sleepiness and tiredness.  Irritability.  Loud snoring.  Morning headaches.  Trouble concentrating.  Forgetfulness.  Decreased interest in sex.  Unexplained sleepiness.  Mood swings.  Personality changes.  Feelings  of depression.  Waking up often during the night to urinate.  Dry mouth.  Sore throat. How is this diagnosed? This condition may be diagnosed with:  A medical history.  A physical exam.  A series of tests that are done while you are sleeping (sleep study). These tests are usually done in a sleep lab, but they may also be done at home. How is this treated? Treatment for this condition aims to restore normal breathing and to ease symptoms during sleep. It may involve managing health issues that can affect breathing, such as high blood pressure or obesity. Treatment may include:  Sleeping on your side.  Using a decongestant if you have nasal congestion.  Avoiding the use of depressants, including alcohol, sedatives, and narcotics.  Losing weight if you are overweight.  Making changes to your diet.  Quitting smoking.  Using a device to open your airway while you sleep, such as: ? An oral appliance. This is a custom-made mouthpiece that shifts your lower jaw forward. ? A continuous positive airway pressure (CPAP) device. This device delivers oxygen to your airway through a mask. ? A nasal expiratory positive airway pressure (EPAP) device. This device has valves that you put into each nostril. ? A bi-level positive airway pressure (BPAP) device. This device delivers oxygen to your airway through a mask.  Surgery if other treatments do not work. During surgery, excess tissue is removed to create a wider airway. It is important to get treatment for sleep apnea. Without treatment, this condition can lead to:  High blood  pressure.  Coronary artery disease.  (Men) An inability to achieve or maintain an erection (impotence).  Reduced thinking abilities. Follow these instructions at home:  Make any lifestyle changes that your health care provider recommends.  Eat a healthy, well-balanced diet.  Take over-the-counter and prescription medicines only as told by your health care  provider.  Avoid using depressants, including alcohol, sedatives, and narcotics.  Take steps to lose weight if you are overweight.  If you were given a device to open your airway while you sleep, use it only as told by your health care provider.  Do not use any tobacco products, such as cigarettes, chewing tobacco, and e-cigarettes. If you need help quitting, ask your health care provider.  Keep all follow-up visits as told by your health care provider. This is important. Contact a health care provider if:  The device that you received to open your airway during sleep is uncomfortable or does not seem to be working.  Your symptoms do not improve.  Your symptoms get worse. Get help right away if:  You develop chest pain.  You develop shortness of breath.  You develop discomfort in your back, arms, or stomach.  You have trouble speaking.  You have weakness on one side of your body.  You have drooping in your face. These symptoms may represent a serious problem that is an emergency. Do not wait to see if the symptoms will go away. Get medical help right away. Call your local emergency services (911 in the [place] Do not drive yourself to the hospital. This information is not intended to replace advice given to you by your health care provider. Make sure you discuss any questions you have with your health care provider. Document Released: 01/30/2002 Document Revised: 09/07/2016 Document Reviewed: 11/19/2014 Elsevier Interactive Patient Education  2019 [employer]

## 2018-08-08 NOTE — Progress Notes (Signed)
Scott ArtisMark [name] a 53 y.o. male who [facility] today for possible sleep apnea.  Patient had a sleep study in 2012 that showed mild sleep apnea.  He notes that his symptoms have worsened recently.  He wakes up gasping and feels tired during the day.  He notes that he is snoring badly.  He is worried that his sleep apnea has worsened and he is developed more severe sleep apnea and is interested in CPAP.  He notes that he will probably have to have repeat sleep study.     ROS as above:  Exam:  BP 134/81   Pulse (!) 57   Temp 98.3 F (36.8 C) (Oral)   Wt 235 lb (106.6 kg)   BMI 35.73 kg/m  Wt Readings from Last 5 Encounters:  08/08/18 235 lb (106.6 kg)  07/22/18 234 lb (106.1 kg)  07/08/18 233 lb (105.7 kg)  07/01/18 241 lb (109.3 kg)  04/13/18 235 lb (106.6 kg)    Gen: Well NAD HEENT: EOMI,  MMM Lungs: Normal work of breathing. CTABL Heart: RRR no MRG Abd: NABS, Soft. Nondistended, Nontender Exts: Brisk capillary refill, warm and well perfused.   Lab and Radiology Results No results found for this or any previous visit (from the past 72 hour(s)). No results found.    Assessment and Plan: 53 y.o. male with fatigue likely related to worsening OSA. Plan to evaluate with home sleep study. Likely will start CPAP with auto-titration.  Recheck sooner if needed.   Work on weight loss low carbs.  Will request recent labs from Scott Fields.   PDMP not reviewed this encounter. Orders Placed This Encounter  Procedures  . Home sleep test    Standing Status:   Future    Standing Expiration Date:   08/08/2019    Order Specific Question:   Where should this test be performed:    Answer:   Other   No orders of the defined types were placed in this encounter.    Historical information moved to improve visibility of documentation.  Past Medical  History:  Diagnosis Date  . ADHD (attention deficit hyperactivity disorder)   . Appendicitis 1998  . Asthma    Exercise Induced  . Fracture of wrist 1984  . Fracture, maxillary (HCC) 2009  . Low testosterone 09/15/2017  . PONV (postoperative nausea and vomiting)   . Prediabetes 09/15/2017  . Staph infection 2000 and 2002   staph infections post surgeries appe and vasectomy  . Thyroid disease    Past Surgical History:  Procedure Laterality Date  . APPENDECTOMY    . CHONDROPLASTY Right 12/17/2016   Procedure: CHONDROPLASTY;  Surgeon: Loreta AveMurphy, Daniel F, MD;  Location: [facility];  Service: Orthopedics;  Laterality: Right;  . KNEE ARTHROSCOPY Right 12/17/2016   Procedure: KNEE ARTHROSCOPY, REMOVAL OF LOOSE BODIES;  Surgeon: Loreta AveMurphy, Daniel F, MD;  Location: [facility];  Service: Orthopedics;  Laterality: Right;   Social History   Tobacco Use  . Smoking status: Never Smoker  . Smokeless tobacco: Never Used  Substance Use Topics  . Alcohol use: No    Alcohol/week: 0.5 standard drinks    Types: 1 Glasses of wine per week    Comment: One drink per week.   family history includes Alcohol abuse in his maternal aunt and maternal uncle; Diabetes in his mother; Emphysema in his father; Heart attack in his father; Heart failure in  his father; Hypertension in his mother.  Medications: Current Outpatient Medications  Medication Sig Dispense Refill  . albuterol (PROAIR HFA) 108 (90 Base) MCG/ACT inhaler Inhale 2 puffs into the lungs every 6 (six) hours as needed for wheezing or shortness of breath. 8.5 Inhaler 11  . cholecalciferol (VITAMIN D) 1000 units tablet Take 4,000 Units by mouth daily.    . diazepam (VALIUM) 10 MG tablet Take 1 tablet (10 mg total) by mouth at bedtime as needed for anxiety. 30 tablet 3  . L-ARGININE PO Take by mouth daily.    Marland Kitchen MAGNESIUM CITRATE PO Take 250 mg by mouth.    . Melatonin 3 MG TABS Take 3 mg by mouth.    . Omega-3 Fatty Acids  (FISH OIL) 1000 MG CAPS Take 1,000 mg by mouth.    . ondansetron (ZOFRAN-ODT) 8 MG disintegrating tablet Take 1 tablet (8 mg total) by mouth every 8 (eight) hours as needed for nausea. 20 tablet 3  . testosterone cypionate (DEPOTESTOSTERONE CYPIONATE) 200 MG/ML injection INJECT 0.4 MLS WEEKLY AS DIRECTED    . Thyroid (NATURE-THROID) 113.75 MG TABS Take 112 mcg by mouth.     No current facility-administered medications for this visit.    Allergies  Allergen Reactions  . Naproxen Nausea And Vomiting     Discussed warning signs or symptoms. Please see discharge instructions. Patient expresses understanding.

## 2018-08-15 DIAGNOSIS — F900 Attention-deficit hyperactivity disorder, predominantly inattentive type: Secondary | ICD-10-CM | POA: Diagnosis not present

## 2018-08-15 DIAGNOSIS — F320 Major depressive disorder, single episode, mild: Secondary | ICD-10-CM | POA: Diagnosis not present

## 2018-08-16 ENCOUNTER — Encounter: Payer: Self-pay | Admitting: Family Medicine

## 2018-08-22 ENCOUNTER — Encounter: Payer: Self-pay | Admitting: Gastroenterology

## 2018-09-12 DIAGNOSIS — F320 Major depressive disorder, single episode, mild: Secondary | ICD-10-CM | POA: Diagnosis not present

## 2018-09-12 DIAGNOSIS — F900 Attention-deficit hyperactivity disorder, predominantly inattentive type: Secondary | ICD-10-CM | POA: Diagnosis not present

## 2018-09-15 DIAGNOSIS — H6121 Impacted cerumen, right ear: Secondary | ICD-10-CM | POA: Diagnosis not present

## 2018-09-16 ENCOUNTER — Ambulatory Visit (AMBULATORY_SURGERY_CENTER): Payer: Self-pay

## 2018-09-16 ENCOUNTER — Other Ambulatory Visit: Payer: Self-pay

## 2018-09-16 VITALS — Ht 68.0 in | Wt 235.0 lb

## 2018-09-16 DIAGNOSIS — Z1211 Encounter for screening for malignant neoplasm of colon: Secondary | ICD-10-CM

## 2018-09-16 MED ORDER — NA SULFATE-K SULFATE-MG SULF 17.5-3.13-1.6 GM/177ML PO SOLN: 1.0000 | Freq: Once | ORAL | 0 refills | Status: AC

## 2018-09-16 NOTE — Progress Notes (Signed)
Denies allergies to eggs or soy products. Denies complication of anesthesia or sedation. Denies use of weight loss medication. Denies use of O2.   Emmi instructions given for colonoscopy.  Pre-Visit was conducted by phone due to Covid 19. Instructions were reviewed and mailed to patients confirmed home address. A 15.00 coupon for Suprep was given to the patient. Patient was encouraged to call if he had any questions regarding instructions.  

## 2018-09-29 ENCOUNTER — Telehealth: Payer: Self-pay | Admitting: Gastroenterology

## 2018-09-29 NOTE — Telephone Encounter (Signed)

## 2018-09-29 NOTE — Telephone Encounter (Signed)
Patient answered "NO" to all covid-19 questions °

## 2018-09-30 ENCOUNTER — Other Ambulatory Visit: Payer: Self-pay

## 2018-09-30 ENCOUNTER — Ambulatory Visit (AMBULATORY_SURGERY_CENTER): Payer: BC Managed Care – PPO | Admitting: Gastroenterology

## 2018-09-30 ENCOUNTER — Encounter: Payer: Self-pay | Admitting: Gastroenterology

## 2018-09-30 VITALS — BP 105/79 | HR 67 | Temp 97.9°F | Resp 16 | Ht 68.0 in | Wt 235.0 lb

## 2018-09-30 DIAGNOSIS — Z1211 Encounter for screening for malignant neoplasm of colon: Secondary | ICD-10-CM

## 2018-09-30 MED ORDER — SODIUM CHLORIDE 0.9 % IV SOLN: 500.0000 mL | Freq: Once | INTRAVENOUS | Status: DC

## 2018-09-30 NOTE — Progress Notes (Signed)
Pt's states no medical or surgical changes since previsit or office visit.  BH temps and CW vitals. Sm

## 2018-09-30 NOTE — Progress Notes (Signed)
PT taken to PACU. Monitors in place. VSS. Report given to RN. 

## 2018-09-30 NOTE — Patient Instructions (Signed)
Handout given for hemorrhoids.  You don't need another colonoscopy for 10 years.  YOU HAD AN ENDOSCOPIC PROCEDURE TODAY AT THE [facility]:   Refer to the procedure report that was given to you for any specific questions about what was found during the examination.  If the procedure report does not answer your questions, please call your gastroenterologist to clarify.  If you requested that your care partner not be given the details of your procedure findings, then the procedure report has been included in a sealed envelope for you to review at your convenience later.  YOU SHOULD EXPECT: Some feelings of bloating in the abdomen. Passage of more gas than usual.  Walking can help get rid of the air that was put into your GI tract during the procedure and reduce the bloating. If you had a lower endoscopy (such as a colonoscopy or flexible sigmoidoscopy) you may notice spotting of blood in your stool or on the toilet paper. If you underwent a bowel prep for your procedure, you may not have a normal bowel movement for a few days.  Please Note:  You might notice some irritation and congestion in your nose or some drainage.  This is from the oxygen used during your procedure.  There is no need for concern and it should clear up in a day or so.  SYMPTOMS TO REPORT IMMEDIATELY:   Following lower endoscopy (colonoscopy or flexible sigmoidoscopy):  Excessive amounts of blood in the stool  Significant tenderness or worsening of abdominal pains  Swelling of the abdomen that is new, acute  Fever of 100F or higher For urgent or emergent issues, a gastroenterologist can be reached at any hour by calling [phone].   DIET:  We do recommend a small meal at first, but then you may proceed to your regular diet.  Drink plenty of fluids but you should avoid alcoholic beverages for 24 hours.  ACTIVITY:  You should plan to take it easy for the rest of today and you should NOT DRIVE or use heavy  machinery until tomorrow (because of the sedation medicines used during the test).    FOLLOW UP: Our staff will call the number listed on your records 48-72 hours following your procedure to check on you and address any questions or concerns that you may have regarding the information given to you following your procedure. If we do not reach you, we will leave a message.  We will attempt to reach you two times.  During this call, we will ask if you have developed any symptoms of COVID 19. If you develop any symptoms (ie: fever, flu-like symptoms, shortness of breath, cough etc.) before then, please call [phone].  If you test positive for Covid 19 in the 2 weeks post procedure, please call and report this information to us.    If any biopsies were taken you will be contacted by phone or by letter within the next 1-3 weeks.  Please call us at [phone] if you have not heard about the biopsies in 3 weeks.    SIGNATURES/CONFIDENTIALITY: You and/or your care partner have signed paperwork which will be entered into your electronic medical record.  These signatures attest to the fact that that the information above on your After Visit Summary has been reviewed and is understood.  Full responsibility of the confidentiality of this discharge information lies with you and/or your care-partner. 

## 2018-09-30 NOTE — Op Note (Signed)
[facility] Patient Name: Scott ArtisMark Fields Procedure Date: 09/30/2018 8:01 AM MRN: [id number] Endoscopist: Lynann Bolognaajesh Scott Fields , MD Age: 53 Referring MD:  Date of Birth: [birth year] Gender: Male Account #: [id number] Procedure:                Colonoscopy Indications:              Screening for colorectal malignant neoplasm Medicines:                Monitored Anesthesia Care Procedure:                Pre-Anesthesia Assessment:                           - Prior to the procedure, a History and Physical                            was performed, and patient medications and                            allergies were reviewed. The patient's tolerance of                            previous anesthesia was also reviewed. The risks                            and benefits of the procedure and the sedation                            options and risks were discussed with the patient.                            All questions were answered, and informed consent                            was obtained. Prior Anticoagulants: The patient has                            taken no previous anticoagulant or antiplatelet                            agents. ASA Grade Assessment: II - A patient with                            mild systemic disease. After reviewing the risks                            and benefits, the patient was deemed in                            satisfactory condition to undergo the procedure.                           After obtaining informed consent, the colonoscope  was passed under direct vision. Throughout the                            procedure, the patient's blood pressure, pulse, and                            oxygen saturations were monitored continuously. The                            Colonoscope was introduced through the anus and                            advanced to the 2 cm into the ileum. The                            colonoscopy was performed without  difficulty. The                            patient tolerated the procedure well. The quality                            of the bowel preparation was good. The terminal                            ileum, ileocecal valve, appendiceal orifice, and                            rectum were photographed. Scope In: 8:06:20 AM Scope Out: 8:15:31 AM Scope Withdrawal Time: 0 hours 4 minutes 56 seconds  Total Procedure Duration: 0 hours 9 minutes 11 seconds  Findings:                 Non-bleeding internal hemorrhoids were found during                            retroflexion. The hemorrhoids were small.                           The colon (entire examined portion) appeared normal.                           The terminal ileum appeared normal.                           The exam was otherwise without abnormality on                            direct and retroflexion views. Complications:            No immediate complications. Estimated Blood Loss:     Estimated blood loss: none. Impression:               - Non-bleeding internal hemorrhoids.                           - Otherwise normal to TI. Recommendation:           -  Patient has a contact number available for                            emergencies. The signs and symptoms of potential                            delayed complications were discussed with the                            patient. Return to normal activities tomorrow.                            Written discharge instructions were provided to the                            patient.                           - Resume previous diet.                           - Continue present medications.                           - Repeat colonoscopy in 10 years for screening                            purposes. Earlier, if with any new problems or if                            there is any change in family history.                           - Return to GI clinic PRN. Scott Denmark, MD 09/30/2018 8:20:07  AM This report has been signed electronically.

## 2018-10-04 ENCOUNTER — Telehealth: Payer: Self-pay

## 2018-10-04 NOTE — Telephone Encounter (Signed)
  Follow up Call-  Call back number 09/30/2018  Post procedure Call Back phone  # [phone]  Permission to leave phone message Yes  Some recent data might be hidden     Patient questions:  Do you have a fever, pain , or abdominal swelling? No. Pain Score  0 *  Have you tolerated food without any problems? yes  Have you been able to return to your normal activities? Yes.    Do you have any questions about your discharge instructions: Diet   No. Medications  No. Follow up visit  No.  Do you have questions or concerns about your Care? No.  Actions: * If pain score is 4 or above: No action needed, pain <4 1. Have you developed a fever since your procedure? no  2.   Have you had an respiratory symptoms (SOB or cough) since your procedure? no  3.   Have you tested positive for COVID 19 since your procedure no  4.   Have you had any family members/close contacts diagnosed with the COVID 19 since your procedure?  no   If yes to any of these questions please route to Joylene John, RN and Alphonsa Gin, Therapist, sports.

## 2018-10-04 NOTE — Telephone Encounter (Signed)
Left message on follow up call. 

## 2018-10-05 ENCOUNTER — Ambulatory Visit: Payer: BC Managed Care – PPO | Admitting: Family Medicine

## 2018-10-05 ENCOUNTER — Other Ambulatory Visit: Payer: Self-pay

## 2018-10-05 VITALS — BP 130/74 | HR 98 | Ht 68.0 in | Wt 238.0 lb

## 2018-10-05 DIAGNOSIS — Z111 Encounter for screening for respiratory tuberculosis: Secondary | ICD-10-CM

## 2018-10-05 NOTE — Progress Notes (Signed)
Patient presents to office for TB testing. Pt denied ever testing positive in the past. Needing testing done for school.   Patient tolerated injection well without complications in left arm. Advised to schedule nurse visit Friday to have test read.

## 2018-10-07 ENCOUNTER — Encounter: Payer: Self-pay | Admitting: *Deleted

## 2018-10-07 ENCOUNTER — Ambulatory Visit (INDEPENDENT_AMBULATORY_CARE_PROVIDER_SITE_OTHER): Payer: BC Managed Care – PPO | Admitting: Family Medicine

## 2018-10-07 ENCOUNTER — Other Ambulatory Visit: Payer: Self-pay

## 2018-10-07 ENCOUNTER — Encounter: Payer: Self-pay | Admitting: Family Medicine

## 2018-10-07 VITALS — Wt 238.0 lb

## 2018-10-07 DIAGNOSIS — Z111 Encounter for screening for respiratory tuberculosis: Secondary | ICD-10-CM | POA: Diagnosis not present

## 2018-10-07 LAB — TB SKIN TEST
Induration: 0 mm
TB Skin Test: NEGATIVE

## 2018-10-07 NOTE — Progress Notes (Signed)
Patient ID: Scott Fields, male   DOB: 10-26-1965, 53 y.o.   MRN: [id number] Patient returned to office today to have his TB test read. TB test was negative 18mm induration. No redness or bruising. KG LPN

## 2018-10-12 ENCOUNTER — Encounter: Payer: BC Managed Care – PPO | Admitting: Gastroenterology

## 2018-10-17 ENCOUNTER — Encounter: Payer: Self-pay | Admitting: Family Medicine

## 2018-10-18 ENCOUNTER — Encounter: Payer: Self-pay | Admitting: Family Medicine

## 2018-11-04 ENCOUNTER — Ambulatory Visit (HOSPITAL_BASED_OUTPATIENT_CLINIC_OR_DEPARTMENT_OTHER): Payer: BC Managed Care – PPO | Admitting: Internal Medicine

## 2018-11-04 ENCOUNTER — Other Ambulatory Visit: Payer: Self-pay

## 2018-11-07 DIAGNOSIS — F320 Major depressive disorder, single episode, mild: Secondary | ICD-10-CM | POA: Diagnosis not present

## 2018-11-07 DIAGNOSIS — F900 Attention-deficit hyperactivity disorder, predominantly inattentive type: Secondary | ICD-10-CM | POA: Diagnosis not present

## 2018-11-11 DIAGNOSIS — E039 Hypothyroidism, unspecified: Secondary | ICD-10-CM | POA: Diagnosis not present

## 2018-11-11 DIAGNOSIS — E291 Testicular hypofunction: Secondary | ICD-10-CM | POA: Diagnosis not present

## 2018-11-11 DIAGNOSIS — R5383 Other fatigue: Secondary | ICD-10-CM | POA: Diagnosis not present

## 2018-11-11 DIAGNOSIS — R7303 Prediabetes: Secondary | ICD-10-CM | POA: Diagnosis not present

## 2018-11-14 ENCOUNTER — Encounter (HOSPITAL_BASED_OUTPATIENT_CLINIC_OR_DEPARTMENT_OTHER): Payer: BC Managed Care – PPO | Admitting: Internal Medicine

## 2018-11-15 ENCOUNTER — Ambulatory Visit (HOSPITAL_BASED_OUTPATIENT_CLINIC_OR_DEPARTMENT_OTHER): Payer: BC Managed Care – PPO | Attending: Family Medicine | Admitting: Internal Medicine

## 2018-11-15 ENCOUNTER — Other Ambulatory Visit: Payer: Self-pay

## 2018-11-15 VITALS — Ht 68.0 in | Wt 235.0 lb

## 2018-11-15 DIAGNOSIS — G4733 Obstructive sleep apnea (adult) (pediatric): Secondary | ICD-10-CM

## 2018-11-15 DIAGNOSIS — G4730 Sleep apnea, unspecified: Secondary | ICD-10-CM | POA: Insufficient documentation

## 2018-11-15 DIAGNOSIS — R5383 Other fatigue: Secondary | ICD-10-CM | POA: Diagnosis not present

## 2018-11-22 DIAGNOSIS — G4730 Sleep apnea, unspecified: Secondary | ICD-10-CM

## 2018-11-22 NOTE — Procedures (Signed)
     Patient Name: Scott Fields, Scott Fields Date: 11/16/2018 Gender: Male D.O.B: [birth year] Age (years): 38 Referring Provider: Gregor Hams Height (inches): 56 Interpreting Physician: Baird Lyons MD, ABSM Weight (lbs): 235 RPSGT: Jacolyn Reedy BMI: 36 MRN: [id number] Neck Size: 17.50  CLINICAL INFORMATION Sleep Study Type: HST Indication for sleep study: OSA Epworth Sleepiness Score: 11  SLEEP STUDY TECHNIQUE A multi-channel overnight portable sleep study was performed. The channels recorded were: nasal airflow, thoracic respiratory movement, and oxygen saturation with a pulse oximetry. Snoring was also monitored.  MEDICATIONS Patient self administered medications include: none reported.  SLEEP ARCHITECTURE Patient was studied for 376.5 minutes. The sleep efficiency was 99.2 % and the patient was supine for 7.1%. The arousal index was 0.0 per hour.  RESPIRATORY PARAMETERS The overall AHI was 49.7 per hour, with a central apnea index of 0.0 per hour. The oxygen nadir was 81% during sleep.  CARDIAC DATA Mean heart rate during sleep was 62.7 bpm.  IMPRESSIONS - Severe obstructive sleep apnea occurred during this study (AHI = 49.7/h). - No significant central sleep apnea occurred during this study (CAI = 0.0/h). - Moderate oxygen desaturation was noted during this study (Min O2 = 81%). Mean sat 93%, - Patient snored.  DIAGNOSIS - Obstructive Sleep Apnea (327.23 [G47.33 ICD-10])  RECOMMENDATIONS - Suggest CPAP titration sleep study or autopap. Other options would be based on clinical judgment. - Be careful with alcohol, sedatives and other CNS depressants that may worsen sleep apnea and disrupt normal sleep architecture. - Sleep hygiene should be reviewed to assess factors that may improve sleep quality. - Weight management and regular exercise should be initiated or continued.  [Electronically signed] 11/22/2018 02:40 PM  Baird Lyons MD, [place], [employer]  Board of [employer]   NPI: [id number]                        [place], [place] [facility]  ELECTRONICALLY SIGNED ON:  11/22/2018, 2:37 PM [place] PH: [phone]   FX: [phone] [place]

## 2018-11-23 ENCOUNTER — Telehealth: Payer: Self-pay | Admitting: Family Medicine

## 2018-11-23 DIAGNOSIS — G4733 Obstructive sleep apnea (adult) (pediatric): Secondary | ICD-10-CM

## 2018-11-23 MED ORDER — AMBULATORY NON FORMULARY MEDICATION: 0 refills | Status: DC

## 2018-11-23 NOTE — Telephone Encounter (Signed)
Sleep study shows significant sleep apnea. Plan for CPAP. I will order a CPAP through a medical supply company.   I will set the CPAP machine to automatically titrate to the appropriate level. After 1 month I will get a report back of how well the machine is working and what level of pressure you need and we can often then set the CPAP machine to that pressure or leave it on automatic titration. Often the insurance will require some sort of assessment in clinic or with telemedicine within the first few months of CPAP to effectively make sure it is helping you feel better and working.  If you do not hear anything about CPAP from a medical supply company in 1 week let me know.  Make sure to check voice messages left and to accept calls from unknown numbers as sometimes the CPAP companies will use a call center to get things started.  Let me know if you have any problems.

## 2018-11-25 DIAGNOSIS — R7989 Other specified abnormal findings of blood chemistry: Secondary | ICD-10-CM | POA: Diagnosis not present

## 2018-11-25 DIAGNOSIS — E039 Hypothyroidism, unspecified: Secondary | ICD-10-CM | POA: Diagnosis not present

## 2018-11-25 DIAGNOSIS — E7212 Methylenetetrahydrofolate reductase deficiency: Secondary | ICD-10-CM | POA: Diagnosis not present

## 2018-11-25 DIAGNOSIS — F329 Major depressive disorder, single episode, unspecified: Secondary | ICD-10-CM | POA: Diagnosis not present

## 2018-12-12 ENCOUNTER — Other Ambulatory Visit: Payer: Self-pay | Admitting: Family Medicine

## 2018-12-12 DIAGNOSIS — F900 Attention-deficit hyperactivity disorder, predominantly inattentive type: Secondary | ICD-10-CM | POA: Diagnosis not present

## 2018-12-12 DIAGNOSIS — F320 Major depressive disorder, single episode, mild: Secondary | ICD-10-CM | POA: Diagnosis not present

## 2018-12-13 DIAGNOSIS — G4733 Obstructive sleep apnea (adult) (pediatric): Secondary | ICD-10-CM | POA: Diagnosis not present

## 2018-12-14 ENCOUNTER — Encounter: Payer: Self-pay | Admitting: Family Medicine

## 2018-12-14 DIAGNOSIS — G4733 Obstructive sleep apnea (adult) (pediatric): Secondary | ICD-10-CM

## 2018-12-15 ENCOUNTER — Encounter: Payer: Self-pay | Admitting: Family Medicine

## 2018-12-15 MED ORDER — AMBULATORY NON FORMULARY MEDICATION: 0 refills | Status: DC

## 2019-01-09 DIAGNOSIS — F411 Generalized anxiety disorder: Secondary | ICD-10-CM | POA: Diagnosis not present

## 2019-01-13 DIAGNOSIS — G4733 Obstructive sleep apnea (adult) (pediatric): Secondary | ICD-10-CM | POA: Diagnosis not present

## 2019-02-06 DIAGNOSIS — F411 Generalized anxiety disorder: Secondary | ICD-10-CM | POA: Diagnosis not present

## 2019-02-06 DIAGNOSIS — F331 Major depressive disorder, recurrent, moderate: Secondary | ICD-10-CM | POA: Diagnosis not present

## 2019-02-08 ENCOUNTER — Encounter: Payer: Self-pay | Admitting: Osteopathic Medicine

## 2019-02-08 ENCOUNTER — Ambulatory Visit (INDEPENDENT_AMBULATORY_CARE_PROVIDER_SITE_OTHER): Payer: BC Managed Care – PPO | Admitting: Osteopathic Medicine

## 2019-02-08 VITALS — Wt 240.0 lb

## 2019-02-08 DIAGNOSIS — G4733 Obstructive sleep apnea (adult) (pediatric): Secondary | ICD-10-CM | POA: Diagnosis not present

## 2019-02-08 DIAGNOSIS — Z9989 Dependence on other enabling machines and devices: Secondary | ICD-10-CM | POA: Diagnosis not present

## 2019-02-08 NOTE — Progress Notes (Signed)
Virtual Visit via Video (App used: Doximity -converted to phone note due to technical problems) Note  I connected with      Scott Fields on 02/08/19 at 3:04 PM  by a telemedicine application and verified that I am speaking with the correct person using two identifiers.  Patient is at home I am in office   I discussed the limitations of evaluation and management by telemedicine and the availability of in person appointments. The patient expressed understanding and agreed to proceed.  History of Present Illness: Scott [name] a 53 y.o. male who would like to discuss CPAP   CPAP compliance visit  Pt reports he is using CPAP consistently and he notes benefit from this device.    Reports he is using a nasal mask which is helping much more than the face mask, which caused feel of claustrophobia and air hunger.      Observations/Objective: Wt 240 lb (108.9 kg)   BMI 36.49 kg/m  BP Readings from Last 3 Encounters:  10/05/18 130/74  09/30/18 105/79  08/08/18 134/81   Exam: Normal Speech.  NAD  Lab and Radiology Results No results found for this or any previous visit (from the past 72 hour(s)). No results found.     Assessment and Plan: 53 y.o. male with The encounter diagnosis was Obstructive sleep apnea treated with continuous positive airway pressure (CPAP).  See HPI OK to continue current equipment Will route note to [facility]     Follow Up Instructions: Return in about 6 months (around 08/09/2019) for [place] (call week prior to visit for lab orders).    I discussed the assessment and treatment plan with the patient. The patient was provided an opportunity to ask questions and all were answered. The patient agreed with the plan and demonstrated an understanding of the instructions.   The patient was advised to call back or seek an in-person evaluation if any new concerns, if symptoms worsen or if the condition fails to improve as anticipated.  10 minutes of  non-face-to-face time was provided during this encounter.      . . . . . . . . . . . . . Marland Kitchen                   Historical information moved to improve visibility of documentation.  Past Medical History:  Diagnosis Date  . ADHD (attention deficit hyperactivity disorder)   . Allergy   . Appendicitis 1998  . Arthritis   . Asthma    Exercise Induced  . Fracture of wrist 1984  . Fracture, maxillary (HCC) 2009  . GERD (gastroesophageal reflux disease)   . Hyperlipidemia   . Low testosterone 09/15/2017  . PONV (postoperative nausea and vomiting)   . Prediabetes 09/15/2017  . Sleep apnea   . Staph infection 2000 and 2002   staph infections post surgeries appe and vasectomy  . Thyroid disease    Past Surgical History:  Procedure Laterality Date  . APPENDECTOMY    . CHONDROPLASTY Right 12/17/2016   Procedure: CHONDROPLASTY;  Surgeon: Loreta Ave, MD;  Location: [facility];  Service: Orthopedics;  Laterality: Right;  . KNEE ARTHROSCOPY Right 12/17/2016   Procedure: KNEE ARTHROSCOPY, REMOVAL OF LOOSE BODIES;  Surgeon: Loreta Ave, MD;  Location: [facility];  Service: Orthopedics;  Laterality: Right;   Social History   Tobacco Use  . Smoking status: Never Smoker  . Smokeless tobacco: Never Used  Substance Use  Topics  . Alcohol use: No    Alcohol/week: 0.5 standard drinks    Types: 1 Glasses of wine per week    Comment: One drink per week.   family history includes Alcohol abuse in his maternal aunt and maternal uncle; Diabetes in his mother; Emphysema in his father; Heart attack in his father; Heart failure in his father; Hypertension in his mother.  Medications: Current Outpatient Medications  Medication Sig Dispense Refill  . albuterol (PROAIR HFA) 108 (90 Base) MCG/ACT inhaler Inhale 2 puffs into the lungs every 6 (six) hours as needed for wheezing or shortness of breath. 8.5 Inhaler 11  . AMBULATORY NON  FORMULARY MEDICATION Adjust continuous positive airway pressure (CPAP) machine to following settings  auto-titrate from 8-20 cm of H2O pressure. Send to [facility].  Fax [phone] phone: [phone] 1 each 0  . cholecalciferol (VITAMIN D) 1000 units tablet Take 4,000 Units by mouth daily.    . diazepam (VALIUM) 10 MG tablet TAKE 1 TABLET (10 MG TOTAL) BY MOUTH AT BEDTIME AS NEEDED FOR ANXIETY. 30 tablet 4  . L-ARGININE PO Take by mouth daily.    Marland Kitchen MAGNESIUM CITRATE PO Take 250 mg by mouth.    . Melatonin 3 MG TABS Take 3 mg by mouth.    . Omega-3 Fatty Acids (FISH OIL) 1000 MG CAPS Take 1,000 mg by mouth.    . testosterone cypionate (DEPOTESTOSTERONE CYPIONATE) 200 MG/ML injection INJECT 0.4 MLS WEEKLY AS DIRECTED    . Thyroid (NATURE-THROID) 113.75 MG TABS Take 112 mcg by mouth.     No current facility-administered medications for this visit.   Allergies  Allergen Reactions  . Naproxen Nausea And Vomiting

## 2019-02-12 DIAGNOSIS — G4733 Obstructive sleep apnea (adult) (pediatric): Secondary | ICD-10-CM | POA: Diagnosis not present

## 2019-03-06 DIAGNOSIS — F411 Generalized anxiety disorder: Secondary | ICD-10-CM | POA: Diagnosis not present

## 2019-03-07 ENCOUNTER — Encounter: Payer: Self-pay | Admitting: Nurse Practitioner

## 2019-03-07 ENCOUNTER — Ambulatory Visit (INDEPENDENT_AMBULATORY_CARE_PROVIDER_SITE_OTHER): Payer: BC Managed Care – PPO | Admitting: Nurse Practitioner

## 2019-03-07 DIAGNOSIS — G43009 Migraine without aura, not intractable, without status migrainosus: Secondary | ICD-10-CM

## 2019-03-07 MED ORDER — SUMATRIPTAN SUCCINATE 50 MG PO TABS: 50.0000 mg | ORAL_TABLET | ORAL | 2 refills | Status: DC | PRN

## 2019-03-07 NOTE — Progress Notes (Signed)
Virtual Visit via Video Note  I connected with Scott Fields on 03/07/19 at  4:00 PM EST by the video enabled telemedicine application, Doximity, and verified that I am speaking with the correct person using two identifiers.   I discussed the limitations of evaluation and management by telemedicine and the availability of in person appointments. The patient expressed understanding and agreed to proceed.  Subjective:    CC: Recurrent migraine headaches  HPI: Scott [name] a 54 y.o. y/o male presenting via [place] today for treatment for recurrent migraine headaches. He has a history of chronic migraines, which he has taken medication for in the past. He is now in school full time for massage therapy, working full time, and has responsibilities at home. He feels the added stress is contributing to his migraines, which are occurring every weekend. He reports usually waking [place] on Saturday morning and can feel them begin- occasionally he is able to abort them with acetaminophen or ibuprofen. The migraines that are not aborted become debilitating and he is unable to move from the bed for the rest of the weekend. He had success with sumatriptan in the past and would like to try that again as opposed to a daily preventative medication at this time.   MIGRAINES Duration: months Onset: sudden Severity: 8/10 Quality: aching, pressure-like and throbbing Frequency: a few times a week Location: unilateral, typically over one eye Headache duration: hours to days Radiation: no Time of day headache occurs: varies Alleviating factors: occasionally acetaminophen or ibuprofen  Aggravating factors: light, sound, movement Headache status at time of visit: asymptomatic Treatments attempted: Treatments attempted: rest, APAP, ibuprofen and triptans   Aura: no Nausea:  on occassion Vomiting: no Photophobia:  yes Phonophobia:  yes Effect on social functioning:  yes- in bed for up to 2 days Confusion:   no Gait disturbance/ataxia:  no Behavioral changes:  no Fevers:  no    Past medical history, Surgical history, Family history not pertinant except as noted below, Social history, Allergies, and medications have been entered into the medical record, reviewed, and corrections made.   Review of Systems:  General: No fevers, chills, fatigue, night sweats, weight loss.   Neuro: No numbness, paresthesias, or change in mental status  HEENT: No sinus pain/pressure, ear pain/pressure, sore throat, difficulty swallowing, vision changes, rhinorrhea, epistaxis, loss of taste, loss of smell Pulmonary/CV: No cough, shortness of breath, chest pain, edema, palpitations, syncope GI: No abdominal pain, vomiting, diarrhea, changes in bowel habits, anorexia   Objective:    General: Speaking clearly in complete sentences without any shortness of breath.  Alert and oriented x3.  Normal judgment. No apparent acute distress.  Impression and Recommendations:    1. Migraine without aura and without status migrainosus, not intractable Will restart sumatriptan at 50mg  dose with option to repeat after 2 hours. If this dose is ineffective, we can go up. Information provided on sumatriptan. Discussed the preventative options with the patient in the event this is not effective at max dose. He feels his headaches will lessen once his stress level has decreased    - SUMAtriptan (IMITREX) 50 MG tablet; Take 1 tablet (50 mg total) by mouth as needed for migraine. May repeat in 2 hours if headache persists or recurs. DO NOT TAKE MORE THAN 200MG  IN HOURS.  Dispense: 30 tablet; Refill: 2   Mr. Vigna exercises at the gym regularly, but has difficulty breathing with a mask on while in full exertion due to mild asthma. He is  requesting a waiver note to allow him to take his mask off when working out. He understands the importance of social distancing and mask wearing and requests a waiver for only exercise as it is affecting  his ability to work out. It is highly recommended that he avoid removing his mask if other persons are within 6 feet. A note will be written and added to his MyChart to print.   I discussed the assessment and treatment plan with the patient. The patient was provided an opportunity to ask questions and all were answered. The patient agreed with the plan and demonstrated an understanding of the instructions.   The patient was advised to call back or seek an in-person evaluation if the symptoms worsen or if the condition fails to improve as anticipated.    Tollie Eth, NP

## 2019-03-07 NOTE — Patient Instructions (Signed)
Sumatriptan tablets What is this medicine? SUMATRIPTAN (soo ma TRIP tan) is used to treat migraines with or without aura. An aura is a strange feeling or visual disturbance that warns you of an attack. It is not used to prevent migraines. This medicine may be used for other purposes; ask your health care provider or pharmacist if you have questions. COMMON BRAND NAME(S): Imitrex, Migraine Pack What should I tell my health care provider before I take this medicine? They need to know if you have any of these conditions:  cigarette smoker  circulation problems in fingers and toes  diabetes  heart disease  high blood pressure  high cholesterol  history of irregular heartbeat  history of stroke  kidney disease  liver disease  stomach or intestine problems  an unusual or allergic reaction to sumatriptan, other medicines, foods, dyes, or preservatives  pregnant or trying to get pregnant  breast-feeding How should I use this medicine? Take this medicine by mouth with a glass of water. Follow the directions on the prescription label. Do not take it more often than directed. Talk to your pediatrician regarding the use of this medicine in children. Special care may be needed. Overdosage: If you think you have taken too much of this medicine contact a poison control center or emergency room at once. NOTE: This medicine is only for you. Do not share this medicine with others. What if I miss a dose? This does not apply. This medicine is not for regular use. What may interact with this medicine? Do not take this medicine with any of the following medicines:  certain medicines for migraine headache like almotriptan, eletriptan, frovatriptan, naratriptan, rizatriptan, sumatriptan, zolmitriptan  ergot alkaloids like dihydroergotamine, ergonovine, ergotamine, methylergonovine  MAOIs like Carbex, Eldepryl, Marplan, Nardil, and Parnate This medicine may also interact with the following  medications:  certain medicines for depression, anxiety, or psychotic disorders This list may not describe all possible interactions. Give your health care provider a list of all the medicines, herbs, non-prescription drugs, or dietary supplements you use. Also tell them if you smoke, drink alcohol, or use illegal drugs. Some items may interact with your medicine. What should I watch for while using this medicine? Visit your healthcare professional for regular checks on your progress. Tell your healthcare professional if your symptoms do not start to get better or if they get worse. You may get drowsy or dizzy. Do not drive, use machinery, or do anything that needs mental alertness until you know how this medicine affects you. Do not stand up or sit up quickly, especially if you are an older patient. This reduces the risk of dizzy or fainting spells. Alcohol may interfere with the effect of this medicine. Tell your healthcare professional right away if you have any change in your eyesight. If you take migraine medicines for 10 or more days a month, your migraines may get worse. Keep a diary of headache days and medicine use. Contact your healthcare professional if your migraine attacks occur more frequently. What side effects may I notice from receiving this medicine? Side effects that you should report to your doctor or health care professional as soon as possible:  allergic reactions like skin rash, itching or hives, swelling of the face, lips, or tongue  changes in vision  chest pain or chest tightness  signs and symptoms of a dangerous change in heartbeat or heart rhythm like chest pain; dizziness; fast, irregular heartbeat; palpitations; feeling faint or lightheaded; falls; breathing problems  signs  and symptoms of a stroke like changes in vision; confusion; trouble speaking or understanding; severe headaches; sudden numbness or weakness of the face, arm or leg; trouble walking; dizziness;  loss of balance or coordination  signs and symptoms of serotonin syndrome like irritable; confusion; diarrhea; fast or irregular heartbeat; muscle twitching; stiff muscles; trouble walking; sweating; high fever; seizures; chills; vomiting Side effects that usually do not require medical attention (report to your doctor or health care professional if they continue or are bothersome):  diarrhea  dizziness  drowsiness  dry mouth  headache  nausea, vomiting  pain, tingling, numbness in the hands or feet  stomach pain This list may not describe all possible side effects. Call your doctor for medical advice about side effects. You may report side effects to FDA at 1-800-FDA-1088. Where should I keep my medicine? Keep out of the reach of children. Store at room temperature between 2 and 30 degrees C (36 and 86 degrees F). Throw away any unused medicine after the expiration date. NOTE: This sheet is a summary. It may not cover all possible information. If you have questions about this medicine, talk to your doctor, pharmacist, or health care provider.  2020 Elsevier/Gold Standard (2017-08-24 15:05:37)

## 2019-03-15 DIAGNOSIS — G4733 Obstructive sleep apnea (adult) (pediatric): Secondary | ICD-10-CM | POA: Diagnosis not present

## 2019-03-21 ENCOUNTER — Other Ambulatory Visit: Payer: Self-pay

## 2019-03-21 ENCOUNTER — Ambulatory Visit: Payer: BC Managed Care – PPO | Admitting: Nurse Practitioner

## 2019-03-21 ENCOUNTER — Encounter: Payer: Self-pay | Admitting: Nurse Practitioner

## 2019-03-21 VITALS — BP 142/87 | HR 68 | Temp 97.7°F | Ht 68.0 in | Wt 240.0 lb

## 2019-03-21 DIAGNOSIS — G43019 Migraine without aura, intractable, without status migrainosus: Secondary | ICD-10-CM

## 2019-03-21 MED ORDER — ERENUMAB-AOOE 70 MG/ML ~~LOC~~ SOAJ: 140.0000 mg | Freq: Once | SUBCUTANEOUS | Status: DC

## 2019-03-21 NOTE — Progress Notes (Signed)
Acute Office Visit  Subjective:    Patient ID: Scott Fields, male    DOB: 13-Dec-1965, 54 y.o.   MRN: [id number]  CC: Recurrent migraine headaches  HPI Patient is in today for recurrent, uncontrolled migraine headaches for the past several months. He was seen 2 weeks ago for the same problem and started on sumatriptan. He reports taking the medication at the first sign of the headaches.  The medication does begin to provide relief, but is incomplete and the headache returns to full force within 2-3 hours. He has tried repeating the medication with little relief.  He reports his current headache started approximately 5 days ago.  He reports his pain at best 3/10 and at worst 7/10.  He describes the headaches as pressure-like and throbbing.  He reports the headaches are debilitating with nausea, vomiting, and sensitivity to light and sound.  He does have a long history of migraine and cluster headaches dating back to his teenage years.  He has been on multiple preventative and abortive medications.  In the past sumatriptan was beneficial, however he feels that his current stress level with working full-time and going to school full-time have precipitated the recent headaches and may be preventing recovery with abortive treatment. He was a part of the clinical trial for Aimovig on the 140 mg dose in the past.  During this time his headaches decreased from an average of 19 a month down to 1 or less per month.  He would like to try this medication again.  He has already contacted his insurance company and is aware of the co-pay that will be required.  Past Medical History:  Diagnosis Date  . ADHD (attention deficit hyperactivity disorder)   . Allergy   . Appendicitis 1998  . Arthritis   . Asthma    Exercise Induced  . Fracture of wrist 1984  . Fracture, maxillary ([place]) 2009  . GERD (gastroesophageal reflux disease)   . Hyperlipidemia   . Low testosterone 09/15/2017  . PONV (postoperative nausea and  vomiting)   . Prediabetes 09/15/2017  . Sleep apnea   . Staph infection 2000 and 2002   staph infections post surgeries appe and vasectomy  . Thyroid disease     Past Surgical History:  Procedure Laterality Date  . APPENDECTOMY    . CHONDROPLASTY Right 12/17/2016   Procedure: CHONDROPLASTY;  Surgeon: Ninetta Lights, MD;  Location: [facility];  Service: Orthopedics;  Laterality: Right;  . KNEE ARTHROSCOPY Right 12/17/2016   Procedure: KNEE ARTHROSCOPY, REMOVAL OF LOOSE BODIES;  Surgeon: Ninetta Lights, MD;  Location: [facility];  Service: Orthopedics;  Laterality: Right;    Family History  Problem Relation Age of Onset  . Heart attack Father   . Emphysema Father   . Heart failure Father   . Alcohol abuse Maternal Aunt   . Alcohol abuse Maternal Uncle   . Diabetes Mother   . Hypertension Mother   . Colon cancer Neg Hx   . Esophageal cancer Neg Hx   . Rectal cancer Neg Hx   . Stomach cancer Neg Hx     Social History   Socioeconomic History  . Marital status: Married    Spouse name: Not on file  . Number of children: Not on file  . Years of education: Not on file  . Highest education level: Not on file  Occupational History  . Not on file  Tobacco Use  . Smoking status: Never Smoker  .  Smokeless tobacco: Never Used  Substance and Sexual Activity  . Alcohol use: No    Alcohol/week: 0.5 standard drinks    Types: 1 Glasses of wine per week    Comment: One drink per week.  . Drug use: No  . Sexual activity: Yes    Partners: Female  Other Topics Concern  . Not on file  Social History Narrative  . Not on file   Social Determinants of Health   Financial Resource Strain:   . Difficulty of Paying Living Expenses: Not on file  Food Insecurity:   . Worried About Programme researcher, broadcasting/film/video in the Last Year: Not on file  . Ran Out of Food in the Last Year: Not on file  Transportation Needs:   . Lack of Transportation (Medical): Not on file   . Lack of Transportation (Non-Medical): Not on file  Physical Activity:   . Days of Exercise per Week: Not on file  . Minutes of Exercise per Session: Not on file  Stress:   . Feeling of Stress : Not on file  Social Connections:   . Frequency of Communication with Friends and Family: Not on file  . Frequency of Social Gatherings with Friends and Family: Not on file  . Attends Religious Services: Not on file  . Active Member of Clubs or Organizations: Not on file  . Attends Banker Meetings: Not on file  . Marital Status: Not on file  Intimate Partner Violence:   . Fear of Current or Ex-Partner: Not on file  . Emotionally Abused: Not on file  . Physically Abused: Not on file  . Sexually Abused: Not on file    Outpatient Medications Prior to Visit  Medication Sig Dispense Refill  . albuterol (PROAIR HFA) 108 (90 Base) MCG/ACT inhaler Inhale 2 puffs into the lungs every 6 (six) hours as needed for wheezing or shortness of breath. 8.5 Inhaler 11  . AMBULATORY NON FORMULARY MEDICATION Adjust continuous positive airway pressure (CPAP) machine to following settings  auto-titrate from 8-20 cm of H2O pressure. Send to [facility] [place].  Fax [phone] phone: [phone] 1 each 0  . cholecalciferol (VITAMIN D) 1000 units tablet Take 4,000 Units by mouth daily.    . diazepam (VALIUM) 10 MG tablet TAKE 1 TABLET (10 MG TOTAL) BY MOUTH AT BEDTIME AS NEEDED FOR ANXIETY. 30 tablet 4  . L-ARGININE PO Take by mouth daily.    Marland Kitchen MAGNESIUM CITRATE PO Take 250 mg by mouth.    . Melatonin 3 MG TABS Take 3 mg by mouth.    . Omega-3 Fatty Acids (FISH OIL) 1000 MG CAPS Take 1,000 mg by mouth.    . SUMAtriptan (IMITREX) 50 MG tablet Take 1 tablet (50 mg total) by mouth as needed for migraine. May repeat in 2 hours if headache persists or recurs. DO NOT TAKE MORE THAN 200MG  IN HOURS. 30 tablet 2  . testosterone cypionate (DEPOTESTOSTERONE CYPIONATE) 200 MG/ML injection INJECT 0.4 MLS  WEEKLY AS DIRECTED    . Thyroid (NATURE-THROID) 113.75 MG TABS Take 112 mcg by mouth.     No facility-administered medications prior to visit.    Allergies  Allergen Reactions  . Naproxen Nausea And Vomiting    Review of Systems  Constitutional: Negative for appetite change, chills, fatigue and fever.  HENT: Negative for congestion, ear pain, sinus pressure and sinus pain.   Respiratory: Negative for chest tightness and shortness of breath.   Cardiovascular: Negative for chest pain.  Gastrointestinal:  Negative for abdominal distention.  Musculoskeletal: Negative for neck pain and neck stiffness.  Neurological: Positive for headaches. Negative for dizziness, weakness, light-headedness and numbness.  Psychiatric/Behavioral: Positive for agitation, decreased concentration and sleep disturbance.       Objective:    Physical Exam Constitutional:      Appearance: Normal appearance.  HENT:     Head: Normocephalic and atraumatic.  Cardiovascular:     Rate and Rhythm: Normal rate and regular rhythm.     Pulses: Normal pulses.     Heart sounds: Normal heart sounds.  Pulmonary:     Effort: Pulmonary effort is normal.     Breath sounds: Normal breath sounds.  Musculoskeletal:     Cervical back: Normal range of motion.  Skin:    General: Skin is warm and dry.     Capillary Refill: Capillary refill takes less than 2 seconds.  Neurological:     General: No focal deficit present.     Mental Status: He is alert and oriented to person, place, and time.     Cranial Nerves: No cranial nerve deficit.     Sensory: Sensation is intact.     Motor: Motor function is intact. No weakness.     Coordination: Coordination is intact.     Gait: Gait is intact.  Psychiatric:        Mood and Affect: Mood normal.        Behavior: Behavior normal.        Thought Content: Thought content normal.     There were no vitals taken for this visit. Wt Readings from Last 3 Encounters:  02/08/19 240 lb  (108.9 kg)  11/15/18 235 lb (106.6 kg)  10/07/18 238 lb (108 kg)    Health Maintenance Due  Topic Date Due  . Fecal DNA (Cologuard)  11/18/2013    There are no preventive care reminders to display for this patient.   Lab Results  Component Value Date   TSH 5.36 (H) 07/01/2018   Lab Results  Component Value Date   WBC 4.2 07/01/2018   HGB 15.3 07/01/2018   HCT 45.2 07/01/2018   MCV 90.0 07/01/2018   PLT 158 07/01/2018   Lab Results  Component Value Date   NA 140 07/01/2018   K 4.6 07/01/2018   CO2 26 07/01/2018   GLUCOSE 138 07/01/2018   BUN 16 07/01/2018   CREATININE 1.21 07/01/2018   BILITOT 0.5 07/01/2018   ALKPHOS 70 05/21/2017   AST 21 07/01/2018   ALT 25 07/01/2018   PROT 6.7 07/01/2018   ALBUMIN 4.2 10/20/2016   CALCIUM 9.1 07/01/2018   Lab Results  Component Value Date   CHOL 170 09/03/2017   Lab Results  Component Value Date   HDL 46 09/03/2017   Lab Results  Component Value Date   LDLCALC 106 (H) 09/03/2017   Lab Results  Component Value Date   TRIG 89 09/03/2017   Lab Results  Component Value Date   CHOLHDL 3.7 09/03/2017   Lab Results  Component Value Date   HGBA1C 5.8 05/21/2017       Assessment & Plan:   1. Intractable migraine without aura and without status migrainosus Worsening chronic migraine symptoms likely brought on from stress associated with job and school.  Sumatriptan has been ineffective as abortive therapy for the past 2 weeks.  The patient has taken Aimovig Dorise Hiss) injectable at 140 mg with great success in the past.  Discussed with the patient that the medication may  take several weeks to build the full effect.  The patient has already been in contact with his insurance company and is aware of the co-pay associated with this medication. Will consider consult to neurology if this is ineffective.   Patient requesting that next month's refill be sent to the mail in pharmacy.  He will contact the office  approximately 2 weeks before this time to request. Dorise Hiss SOAJ 140 mg  Return if symptoms worsen or fail to improve.  Tollie Eth, NP

## 2019-03-21 NOTE — Patient Instructions (Addendum)
Recurrent Migraine Headache ° °Migraines are a type of headache, and they are usually stronger and more sudden than normal headaches (tension headaches). Migraines are characterized by an intense pulsing, throbbing pain that is usually only present on one side of the head. Sometimes, migraine headaches can cause nausea, vomiting, sensitivity to light and sound, and vision changes. Recurrent migraines keep coming back (recurring). A migraine can last from 4 hours up to 3 days. °What are the causes? °The exact cause of this condition is not known. However, a migraine may be caused when nerves in the brain become irritated and release chemicals that cause inflammation of blood vessels. This inflammation causes pain. °Certain things may also trigger migraines, such as: °· A disruption in your regular eating and sleeping schedule. °· Smoking. °· Stress. °· Menstruation. °· Certain foods and drinks, such as: °? Aged cheese. °? Chocolate. °? Alcohol. °? Caffeine. °? Foods or drinks that contain nitrates, glutamate, aspartame, MSG, or tyramine. °· Lack of sleep. °· Hunger. °· Physical exertion. °· Fatigue. °· High altitude. °· Weather changes. °· Medicines, such as: °? Nitroglycerin, which is used to treat chest pain. °? Birth control pills. °? Estrogen. °? Some blood pressure medicines. °What are the signs or symptoms? °Symptoms of this condition vary for each person and may include: °· Pain that is usually only present on one side of the head. In some cases, the pain may be on both sides of the head or around the head or neck. °· Pulsating or throbbing pain. °· Severe pain that prevents daily activities. °· Pain that is aggravated by any physical activity. °· Nausea, vomiting, or both. °· Dizziness. °· Pain with exposure to bright lights, loud noises, or activity. °· General sensitivity to bright lights, loud noises, or smells. °Before you get a migraine, you may get warning signs that a migraine is coming (aura). An aura  may include: °· Seeing flashing lights. °· Seeing bright spots, halos, or zigzag lines. °· Having tunnel vision or blurred vision. °· Having numbness or a tingling feeling. °· Having trouble talking. °· Having muscle weakness. °· Smelling a certain odor. °How is this diagnosed? °This condition is often diagnosed based on: °· Your symptoms and medical history. °· A physical exam. °You may also have tests, including: °· A CT scan or MRI of your brain. These imaging tests cannot diagnose migraines, but they can help to rule out other causes of headaches. °· Blood tests. °How is this treated? °This condition is treated with: °· Medicines. These are used for: °? Lessening pain and nausea. °? Preventing recurrent migraines. °· Lifestyle changes, such as changes to your diet or sleeping patterns. °· Behavior therapy, such as relaxation training or biofeedback. Biofeedback is a treatment that involves teaching you to relax and use your brain to lower your heart rate and control your breathing. °Follow these instructions at home: °Medicines °· Take over-the-counter and prescription medicines only as told by your health care provider. °· Do not drive or use heavy machinery while taking prescription pain medicine. °Lifestyle °· Do not use any products that contain nicotine or tobacco, such as cigarettes and e-cigarettes. If you need help quitting, ask your health care provider. °· Limit alcohol intake to no more than 1 drink a day for nonpregnant women and 2 drinks a day for men. One drink equals 12 oz of beer, 5 oz of wine, or 1½ oz of hard liquor. °· Get 7-9 hours of sleep each night, or the amount of   sleep recommended by your health care provider. °· Limit your stress. Talk with your health care provider if you need help with stress management. °· Maintain a healthy weight. If you need help losing weight, ask your health care provider. °· Exercise regularly. Aim for 150 minutes of moderate-intensity exercise (walking,  biking, yoga) or 75 minutes of vigorous exercise (running, circuit training, swimming) each week. °General instructions ° °· Keep a journal to find out what triggers your migraine headaches so you can avoid these triggers. For example, write down: °? What you eat and drink. °? How much sleep you get. °? Any change to your diet or medicines. °· Lie down in a dark, quiet room when you have a migraine. °· Try placing a cool towel over your head when you have a migraine. °· Keep lights dim, if bright lights bother you and make your migraines worse. °· Keep all follow-up visits as told by your health care provider. This is important. °Contact a health care provider if: °· Your pain does not improve, even with medicine. °· Your migraines continue to return, even with medicine. °· You have a fever. °· You have weight loss. °Get help right away if: °· Your migraine becomes severe and medicine does not help. °· You have a stiff neck. °· You have a loss of vision. °· You have muscle weakness or loss of muscle control. °· You start losing your balance or have trouble walking. °· You feel faint or you pass out. °· You develop new, severe symptoms. °· You start having abrupt severe headaches that last for a second or less, like a thunderclap. °Summary °· Migraine headaches are usually stronger and more sudden than normal headaches (tension headaches). Migraines are characterized by an intense pulsing, throbbing pain that is usually only present on one side of the head. °· The exact cause of this condition is not known. However, a migraine may be caused when nerves in the brain become irritated and release chemicals that cause inflammation of blood vessels. °· Certain things may trigger migraines, such as changes to diet or sleeping patterns, smoking, certain foods, alcohol, stress, and certain medicines. °· Sometimes, migraine headaches can cause nausea, vomiting, sensitivity to light and sound, and vision changes. °· Migraines  are often diagnosed based on your symptoms, medical history, and a physical exam. °This information is not intended to replace advice given to you by your health care provider. Make sure you discuss any questions you have with your health care provider. °Document Revised: 02/12/2017 Document Reviewed: 11/22/2015 °Elsevier Patient Education © 2020 [employer] ° °

## 2019-03-22 ENCOUNTER — Other Ambulatory Visit: Payer: Self-pay | Admitting: Nurse Practitioner

## 2019-03-22 DIAGNOSIS — G43019 Migraine without aura, intractable, without status migrainosus: Secondary | ICD-10-CM

## 2019-03-22 MED ORDER — ERENUMAB-AOOE 140 MG/ML ~~LOC~~ SOAJ: 140.0000 mg | Freq: Once | SUBCUTANEOUS | Status: DC

## 2019-04-10 DIAGNOSIS — F411 Generalized anxiety disorder: Secondary | ICD-10-CM | POA: Diagnosis not present

## 2019-04-14 DIAGNOSIS — E559 Vitamin D deficiency, unspecified: Secondary | ICD-10-CM | POA: Diagnosis not present

## 2019-04-14 DIAGNOSIS — E291 Testicular hypofunction: Secondary | ICD-10-CM | POA: Diagnosis not present

## 2019-04-14 DIAGNOSIS — R7303 Prediabetes: Secondary | ICD-10-CM | POA: Diagnosis not present

## 2019-04-14 DIAGNOSIS — R5383 Other fatigue: Secondary | ICD-10-CM | POA: Diagnosis not present

## 2019-04-14 DIAGNOSIS — E039 Hypothyroidism, unspecified: Secondary | ICD-10-CM | POA: Diagnosis not present

## 2019-04-27 ENCOUNTER — Other Ambulatory Visit: Payer: Self-pay | Admitting: Nurse Practitioner

## 2019-04-27 DIAGNOSIS — G43019 Migraine without aura, intractable, without status migrainosus: Secondary | ICD-10-CM

## 2019-04-27 MED ORDER — AIMOVIG 140 MG/ML ~~LOC~~ SOAJ: 1.0000 | SUBCUTANEOUS | 12 refills | Status: DC

## 2019-04-28 DIAGNOSIS — F329 Major depressive disorder, single episode, unspecified: Secondary | ICD-10-CM | POA: Diagnosis not present

## 2019-04-28 DIAGNOSIS — E039 Hypothyroidism, unspecified: Secondary | ICD-10-CM | POA: Diagnosis not present

## 2019-04-28 DIAGNOSIS — R7303 Prediabetes: Secondary | ICD-10-CM | POA: Diagnosis not present

## 2019-04-28 DIAGNOSIS — E7212 Methylenetetrahydrofolate reductase deficiency: Secondary | ICD-10-CM | POA: Diagnosis not present

## 2019-05-30 DIAGNOSIS — F411 Generalized anxiety disorder: Secondary | ICD-10-CM | POA: Diagnosis not present

## 2019-05-30 DIAGNOSIS — F902 Attention-deficit hyperactivity disorder, combined type: Secondary | ICD-10-CM | POA: Diagnosis not present

## 2019-05-30 DIAGNOSIS — F331 Major depressive disorder, recurrent, moderate: Secondary | ICD-10-CM | POA: Diagnosis not present

## 2019-06-15 ENCOUNTER — Other Ambulatory Visit: Payer: Self-pay | Admitting: Family Medicine

## 2019-06-23 NOTE — Telephone Encounter (Signed)
Patient needs new PCP for refills

## 2019-06-26 DIAGNOSIS — F411 Generalized anxiety disorder: Secondary | ICD-10-CM | POA: Diagnosis not present

## 2019-06-26 NOTE — Telephone Encounter (Signed)
Upcoming appt to establish with new PCP   Please advise if you are OK with witting RX   Previous PCP Dr Ulyses Amor last written 12/13/18 for #30 with 4 refills

## 2019-06-26 NOTE — Telephone Encounter (Signed)
Appt made for refills with new PCP

## 2019-06-29 ENCOUNTER — Encounter: Payer: Self-pay | Admitting: Family Medicine

## 2019-06-29 ENCOUNTER — Ambulatory Visit (INDEPENDENT_AMBULATORY_CARE_PROVIDER_SITE_OTHER): Payer: BC Managed Care – PPO | Admitting: Family Medicine

## 2019-06-29 ENCOUNTER — Other Ambulatory Visit: Payer: Self-pay

## 2019-06-29 DIAGNOSIS — R7989 Other specified abnormal findings of blood chemistry: Secondary | ICD-10-CM

## 2019-06-29 DIAGNOSIS — R7303 Prediabetes: Secondary | ICD-10-CM

## 2019-06-29 DIAGNOSIS — E039 Hypothyroidism, unspecified: Secondary | ICD-10-CM

## 2019-06-29 DIAGNOSIS — G43019 Migraine without aura, intractable, without status migrainosus: Secondary | ICD-10-CM | POA: Diagnosis not present

## 2019-06-29 NOTE — Progress Notes (Signed)
Yashua Bracco - 54 y.o. male MRN [id number]  Date of birth: 1965-06-18  Subjective No chief complaint on file.   HPI Absalom Aro is a 54 y.o. male with history of hypothyroidism, prediabetes, low testosterone, HLD, and migraines here today for follow up visit.   -Migraines:  Started on Aimovig a few months ago, reports that this has worked well for controlling his migraines.  He has imitrex as needed as well but hasn't had to use this too often.   -Hypothyroidism/Hypogonadism: He is followed by [facility] for management of nature throid and testosterone.  Currently stable on current dosing.    -Prediabetes:  A1c up recently at 6.6%.  He has been working on weight loss and increasing exercise.  He is going to the gym regularly now.    ROS:  A comprehensive ROS was completed and negative except as noted per HPI  Allergies  Allergen Reactions  . Naproxen Nausea And Vomiting    Past Medical History:  Diagnosis Date  . ADHD (attention deficit hyperactivity disorder)   . Allergy   . Appendicitis 1998  . Arthritis   . Asthma    Exercise Induced  . Fracture of wrist 1984  . Fracture, maxillary ([facility]) 2009  . GERD (gastroesophageal reflux disease)   . Hyperlipidemia   . Low testosterone 09/15/2017  . PONV (postoperative nausea and vomiting)   . Prediabetes 09/15/2017  . Sleep apnea   . Staph infection 2000 and 2002   staph infections post surgeries appe and vasectomy  . Thyroid disease     Past Surgical History:  Procedure Laterality Date  . APPENDECTOMY    . CHONDROPLASTY Right 12/17/2016   Procedure: CHONDROPLASTY;  Surgeon: Loreta Ave, MD;  Location: [facility];  Service: Orthopedics;  Laterality: Right;  . KNEE ARTHROSCOPY Right 12/17/2016   Procedure: KNEE ARTHROSCOPY, REMOVAL OF LOOSE BODIES;  Surgeon: Loreta Ave, MD;  Location: [facility];  Service: Orthopedics;  Laterality: Right;    Social History    Socioeconomic History  . Marital status: Married    Spouse name: Not on file  . Number of children: Not on file  . Years of education: Not on file  . Highest education level: Not on file  Occupational History  . Not on file  Tobacco Use  . Smoking status: Never Smoker  . Smokeless tobacco: Never Used  Substance and Sexual Activity  . Alcohol use: No    Alcohol/week: 0.5 standard drinks    Types: 1 Glasses of wine per week    Comment: One drink per week.  . Drug use: No  . Sexual activity: Yes    Partners: Female  Other Topics Concern  . Not on file  Social History Narrative  . Not on file   Social Determinants of Health   Financial Resource Strain:   . Difficulty of Paying Living Expenses:   Food Insecurity:   . Worried About Programme researcher, broadcasting/film/video in the Last Year:   . Barista in the Last Year:   Transportation Needs:   . Freight forwarder (Medical):   Marland Kitchen Lack of Transportation (Non-Medical):   Physical Activity:   . Days of Exercise per Week:   . Minutes of Exercise per Session:   Stress:   . Feeling of Stress :   Social Connections:   . Frequency of Communication with Friends and Family:   . Frequency of Social Gatherings with Friends and Family:   .  Attends Religious Services:   . Active Member of Clubs or Organizations:   . Attends Archivist Meetings:   Marland Kitchen Marital Status:     Family History  Problem Relation Age of Onset  . Heart attack Father   . Emphysema Father   . Heart failure Father   . Alcohol abuse Maternal Aunt   . Alcohol abuse Maternal Uncle   . Diabetes Mother   . Hypertension Mother   . Colon cancer Neg Hx   . Esophageal cancer Neg Hx   . Rectal cancer Neg Hx   . Stomach cancer Neg Hx     Health Maintenance  Topic Date Due  . Fecal DNA (Cologuard)  Never done  . INFLUENZA VACCINE  09/24/2019  . TETANUS/TDAP  04/08/2025  . COVID-19 Vaccine  Completed  . Hepatitis C Screening  Completed  . HIV Screening   Completed     ----------------------------------------------------------------------------------------------------------------------------------------------------------------------------------------------------------------- Physical Exam BP 122/74 (BP Location: Left Arm, Patient Position: Sitting, Cuff Size: Normal)   Pulse 92   Ht 5' 8.11" (1.73 m)   Wt 225 lb 9.6 oz (102.3 kg)   SpO2 97%   BMI 34.19 kg/m   Physical Exam Constitutional:      Appearance: Normal appearance.  HENT:     Head: Normocephalic and atraumatic.  Eyes:     General: No scleral icterus. Cardiovascular:     Rate and Rhythm: Normal rate and regular rhythm.  Pulmonary:     Effort: Pulmonary effort is normal.     Breath sounds: Normal breath sounds.  Musculoskeletal:     Cervical back: Neck supple.  Skin:    General: Skin is warm and dry.  Neurological:     General: No focal deficit present.     Mental Status: He is alert.     ------------------------------------------------------------------------------------------------------------------------------------------------------------------------------------------------------------------- Assessment and Plan  Adult hypothyroidism Followed by integrative medicine, stable on current dose of nature throid.   Low testosterone Managed by integrative medicine, stable at this time at current dose of testosterone.   Prediabetes A1c up recently, working on improving this with weight loss.   Headache, migraine These have stabilized with addition of aimovig.  He has imitrex as needed for breakthrough headaches   No orders of the defined types were placed in this encounter.   Return in about 3 months (around 09/29/2019) for Migraine.    This visit occurred during the SARS-CoV-2 public health emergency.  Safety protocols were in place, including screening questions prior to the visit, additional usage of staff PPE, and extensive cleaning of exam room while  observing appropriate contact time as indicated for disinfecting solutions.

## 2019-06-29 NOTE — Assessment & Plan Note (Signed)
Followed by integrative medicine, stable on current dose of nature throid.

## 2019-06-29 NOTE — Assessment & Plan Note (Signed)
A1c up recently, working on improving this with weight loss.

## 2019-06-29 NOTE — Assessment & Plan Note (Signed)
These have stabilized with addition of aimovig.  He has imitrex as needed for breakthrough headaches

## 2019-06-29 NOTE — Patient Instructions (Signed)
Great to meet you today! Please continue current medications.  Follow up with me in about 3-4 months.

## 2019-06-29 NOTE — Assessment & Plan Note (Signed)
Managed by integrative medicine, stable at this time at current dose of testosterone.

## 2019-07-18 DIAGNOSIS — G4733 Obstructive sleep apnea (adult) (pediatric): Secondary | ICD-10-CM | POA: Diagnosis not present

## 2019-07-25 DIAGNOSIS — F411 Generalized anxiety disorder: Secondary | ICD-10-CM | POA: Diagnosis not present

## 2019-07-25 DIAGNOSIS — F331 Major depressive disorder, recurrent, moderate: Secondary | ICD-10-CM | POA: Diagnosis not present

## 2019-08-25 ENCOUNTER — Other Ambulatory Visit: Payer: Self-pay | Admitting: Family Medicine

## 2019-09-04 DIAGNOSIS — F411 Generalized anxiety disorder: Secondary | ICD-10-CM | POA: Diagnosis not present

## 2019-09-04 DIAGNOSIS — F331 Major depressive disorder, recurrent, moderate: Secondary | ICD-10-CM | POA: Diagnosis not present

## 2019-09-15 DIAGNOSIS — E291 Testicular hypofunction: Secondary | ICD-10-CM | POA: Diagnosis not present

## 2019-09-15 DIAGNOSIS — R7303 Prediabetes: Secondary | ICD-10-CM | POA: Diagnosis not present

## 2019-09-15 DIAGNOSIS — R5383 Other fatigue: Secondary | ICD-10-CM | POA: Diagnosis not present

## 2019-09-15 DIAGNOSIS — E039 Hypothyroidism, unspecified: Secondary | ICD-10-CM | POA: Diagnosis not present

## 2019-10-03 ENCOUNTER — Ambulatory Visit (INDEPENDENT_AMBULATORY_CARE_PROVIDER_SITE_OTHER): Payer: BC Managed Care – PPO | Admitting: Nurse Practitioner

## 2019-10-03 ENCOUNTER — Encounter: Payer: Self-pay | Admitting: Nurse Practitioner

## 2019-10-03 VITALS — BP 143/78 | HR 60 | Temp 97.8°F | Ht 68.11 in | Wt 224.1 lb

## 2019-10-03 DIAGNOSIS — H6123 Impacted cerumen, bilateral: Secondary | ICD-10-CM | POA: Diagnosis not present

## 2019-10-03 DIAGNOSIS — H6690 Otitis media, unspecified, unspecified ear: Secondary | ICD-10-CM

## 2019-10-03 DIAGNOSIS — J0100 Acute maxillary sinusitis, unspecified: Secondary | ICD-10-CM

## 2019-10-03 MED ORDER — AMOXICILLIN-POT CLAVULANATE 875-125 MG PO TABS: 1.0000 | ORAL_TABLET | Freq: Two times a day (BID) | ORAL | 0 refills | Status: DC

## 2019-10-03 NOTE — Patient Instructions (Signed)
Sinusitis, Adult Sinusitis is inflammation of your sinuses. Sinuses are hollow spaces in the bones around your face. Your sinuses are located:  Around your eyes.  In the middle of your forehead.  Behind your nose.  In your cheekbones. Mucus normally drains out of your sinuses. When your nasal tissues become inflamed or swollen, mucus can become trapped or blocked. This allows bacteria, viruses, and fungi to grow, which leads to infection. Most infections of the sinuses are caused by a virus. Sinusitis can develop quickly. It can last for up to 4 weeks (acute) or for more than 12 weeks (chronic). Sinusitis often develops after a cold. What are the causes? This condition is caused by anything that creates swelling in the sinuses or stops mucus from draining. This includes:  Allergies.  Asthma.  Infection from bacteria or viruses.  Deformities or blockages in your nose or sinuses.  Abnormal growths in the nose (nasal polyps).  Pollutants, such as chemicals or irritants in the air.  Infection from fungi (rare). What increases the risk? You are more likely to develop this condition if you:  Have a weak body defense system (immune system).  Do a lot of swimming or diving.  Overuse nasal sprays.  Smoke. What are the signs or symptoms? The main symptoms of this condition are pain and a feeling of pressure around the affected sinuses. Other symptoms include:  Stuffy nose or congestion.  Thick drainage from your nose.  Swelling and warmth over the affected sinuses.  Headache.  Upper toothache.  A cough that may get worse at night.  Extra mucus that collects in the throat or the back of the nose (postnasal drip).  Decreased sense of smell and taste.  Fatigue.  A fever.  Sore throat.  Bad breath. How is this diagnosed? This condition is diagnosed based on:  Your symptoms.  Your medical history.  A physical exam.  Tests to find out if your condition is  acute or chronic. This may include: ? Checking your nose for nasal polyps. ? Viewing your sinuses using a device that has a light (endoscope). ? Testing for allergies or bacteria. ? Imaging tests, such as an MRI or CT scan. In rare cases, a bone biopsy may be done to rule out more serious types of fungal sinus disease. How is this treated? Treatment for sinusitis depends on the cause and whether your condition is chronic or acute.  If caused by a virus, your symptoms should go away on their own within 10 days. You may be given medicines to relieve symptoms. They include: ? Medicines that shrink swollen nasal passages (topical intranasal decongestants). ? Medicines that treat allergies (antihistamines). ? A spray that eases inflammation of the nostrils (topical intranasal corticosteroids). ? Rinses that help get rid of thick mucus in your nose (nasal saline washes).  If caused by bacteria, your health care provider may recommend waiting to see if your symptoms improve. Most bacterial infections will get better without antibiotic medicine. You may be given antibiotics if you have: ? A severe infection. ? A weak immune system.  If caused by narrow nasal passages or nasal polyps, you may need to have surgery. Follow these instructions at home: Medicines  Take, use, or apply over-the-counter and prescription medicines only as told by your health care provider. These may include nasal sprays.  If you were prescribed an antibiotic medicine, take it as told by your health care provider. Do not stop taking the antibiotic even if you start   to feel better. Hydrate and humidify   Drink enough fluid to keep your urine pale yellow. Staying hydrated will help to thin your mucus.  Use a cool mist humidifier to keep the humidity level in your home above 50%.  Inhale steam for 10-15 minutes, 3-4 times a day, or as told by your health care provider. You can do this in the bathroom while a hot shower is  running.  Limit your exposure to cool or dry air. Rest  Rest as much as possible.  Sleep with your head raised (elevated).  Make sure you get enough sleep each night. General instructions   Apply a warm, moist washcloth to your face 3-4 times a day or as told by your health care provider. This will help with discomfort.  Wash your hands often with soap and water to reduce your exposure to germs. If soap and water are not available, use hand sanitizer.  Do not smoke. Avoid being around people who are smoking (secondhand smoke).  Keep all follow-up visits as told by your health care provider. This is important. Contact a health care provider if:  You have a fever.  Your symptoms get worse.  Your symptoms do not improve within 10 days. Get help right away if:  You have a severe headache.  You have persistent vomiting.  You have severe pain or swelling around your face or eyes.  You have vision problems.  You develop confusion.  Your neck is stiff.  You have trouble breathing. Summary  Sinusitis is soreness and inflammation of your sinuses. Sinuses are hollow spaces in the bones around your face.  This condition is caused by nasal tissues that become inflamed or swollen. The swelling traps or blocks the flow of mucus. This allows bacteria, viruses, and fungi to grow, which leads to infection.  If you were prescribed an antibiotic medicine, take it as told by your health care provider. Do not stop taking the antibiotic even if you start to feel better.  Keep all follow-up visits as told by your health care provider. This is important. This information is not intended to replace advice given to you by your health care provider. Make sure you discuss any questions you have with your health care provider. Document Revised: 07/12/2017 Document Reviewed: 07/12/2017 Elsevier Patient Education  2020 [employer] Inc.  

## 2019-10-03 NOTE — Progress Notes (Signed)
Acute Office Visit  Subjective:    Patient ID: Scott ArtisMark Fields, male    DOB: [birth year], 54 y.o.   MRN: [id number]  Chief Complaint  Patient presents with  . Sinusitis    ear fullness, HA, states both ears are impacted, facial pressure, PND    HPI Patient is in today for symptoms of sinus pain and pressure, sinus headache, post nasal drip, bilateral ear fullness and pressure, left ear pain, and dry cough present for about a week. He reports that he feels that his ears are impacted with wax and would like to have them cleaned out, if possible.   He has not been on antibiotics in the recent past. He reports he does get frequent sinus infections, but they usually go away on their own- this one has lasted for over a week and is not improving.   Past Medical History:  Diagnosis Date  . ADHD (attention deficit hyperactivity disorder)   . Allergy   . Appendicitis 1998  . Arthritis   . Asthma    Exercise Induced  . Fracture of wrist 1984  . Fracture, maxillary (HCC) 2009  . GERD (gastroesophageal reflux disease)   . Hyperlipidemia   . Low testosterone 09/15/2017  . PONV (postoperative nausea and vomiting)   . Prediabetes 09/15/2017  . Sleep apnea   . Staph infection 2000 and 2002   staph infections post surgeries appe and vasectomy  . Thyroid disease     Past Surgical History:  Procedure Laterality Date  . APPENDECTOMY    . CHONDROPLASTY Right 12/17/2016   Procedure: CHONDROPLASTY;  Surgeon: Loreta AveMurphy, Daniel F, MD;  Location: [facility];  Service: Orthopedics;  Laterality: Right;  . KNEE ARTHROSCOPY Right 12/17/2016   Procedure: KNEE ARTHROSCOPY, REMOVAL OF LOOSE BODIES;  Surgeon: Loreta AveMurphy, Daniel F, MD;  Location: [facility];  Service: Orthopedics;  Laterality: Right;    Family History  Problem Relation Age of Onset  . Heart attack Father   . Emphysema Father   . Heart failure Father   . Alcohol abuse Maternal Aunt   . Alcohol abuse Maternal Uncle    . Diabetes Mother   . Hypertension Mother   . Colon cancer Neg Hx   . Esophageal cancer Neg Hx   . Rectal cancer Neg Hx   . Stomach cancer Neg Hx     Social History   Socioeconomic History  . Marital status: Married    Spouse name: Not on file  . Number of children: Not on file  . Years of education: Not on file  . Highest education level: Not on file  Occupational History  . Not on file  Tobacco Use  . Smoking status: Never Smoker  . Smokeless tobacco: Never Used  Vaping Use  . Vaping Use: Never used  Substance and Sexual Activity  . Alcohol use: No    Alcohol/week: 0.5 standard drinks    Types: 1 Glasses of wine per week    Comment: One drink per week.  . Drug use: No  . Sexual activity: Yes    Partners: Female  Other Topics Concern  . Not on file  Social History Narrative  . Not on file   Social Determinants of Health   Financial Resource Strain:   . Difficulty of Paying Living Expenses:   Food Insecurity:   . Worried About Programme researcher, broadcasting/film/videounning Out of Food in the Last Year:   . [employer] Out of Food in the Last Year:  Transportation Needs:   . Freight forwarder (Medical):   Marland Kitchen Lack of Transportation (Non-Medical):   Physical Activity:   . Days of Exercise per Week:   . Minutes of Exercise per Session:   Stress:   . Feeling of Stress :   Social Connections:   . Frequency of Communication with Friends and Family:   . Frequency of Social Gatherings with Friends and Family:   . Attends Religious Services:   . Active Member of Clubs or Organizations:   . Attends Banker Meetings:   Marland Kitchen Marital Status:   Intimate Partner Violence:   . Fear of Current or Ex-Partner:   . Emotionally Abused:   Marland Kitchen Physically Abused:   . Sexually Abused:     Outpatient Medications Prior to Visit  Medication Sig Dispense Refill  . albuterol (PROAIR HFA) 108 (90 Base) MCG/ACT inhaler Inhale 2 puffs into the lungs every 6 (six) hours as needed for wheezing or shortness of breath.  8.5 Inhaler 11  . AMBULATORY NON FORMULARY MEDICATION Adjust continuous positive airway pressure (CPAP) machine to following settings  auto-titrate from 8-20 cm of H2O pressure. Send to [facility] [place].  Fax [phone] phone: [phone] 1 each 0  . cholecalciferol (VITAMIN D) 1000 units tablet Take 4,000 Units by mouth daily.    . diazepam (VALIUM) 10 MG tablet TAKE 1 TABLET BY MOUTH AT BEDTIME AS NEEDED FOR ANXIETY 30 tablet 0  . Erenumab-aooe (AIMOVIG) 140 MG/ML SOAJ Inject 140 mg into the skin every 30 (thirty) days. 1 pen 12  . MAGNESIUM CITRATE PO Take 250 mg by mouth.    . Melatonin 3 MG TABS Take 3 mg by mouth.    . Omega-3 Fatty Acids (FISH OIL) 1000 MG CAPS Take 1,000 mg by mouth.    . SUMAtriptan (IMITREX) 50 MG tablet Take 1 tablet (50 mg total) by mouth as needed for migraine. May repeat in 2 hours if headache persists or recurs. DO NOT TAKE MORE THAN 200MG  IN HOURS. 30 tablet 2  . testosterone cypionate (DEPOTESTOSTERONE CYPIONATE) 200 MG/ML injection INJECT 0.4 MLS WEEKLY AS DIRECTED    . Thyroid (NATURE-THROID) 113.75 MG TABS Take 112 mcg by mouth.     No facility-administered medications prior to visit.    Allergies  Allergen Reactions  . Naproxen Nausea And Vomiting       Objective:    Physical Exam Vitals and nursing note reviewed.  Constitutional:      Appearance: He is ill-appearing.  HENT:     Head:     Comments: Maxillary sinus tenderness- bilateral    Right Ear: Decreased hearing noted. A middle ear effusion is present. There is impacted cerumen.     Left Ear: Decreased hearing noted. A middle ear effusion is present. There is impacted cerumen. Tympanic membrane is injected and bulging.     Ears:     Comments: Cerumen Removal- bilaterally: 2.5 cc of docusate sodium solution instilled into each ear canal and allowed to permeate cerumen for 5 minutes.  Docusate sodium drained from ear canals and flushed with warm water and hydrogen peroxide  solution. Remaining cerumen manually removed with cerumen removal tool. Ear canals flushed with warm water solution to removal excess cerumen and residue. Ear canals and TM's evaluated for clearance of cerumen.  Ears dried with clean cotton gauze. Pt reported improved hearing bilaterally and decreased pain and pressure.     Nose: Congestion present.     Mouth/Throat:  Mouth: Mucous membranes are moist.     Pharynx: Oropharynx is clear.  Eyes:     Extraocular Movements: Extraocular movements intact.     Conjunctiva/sclera: Conjunctivae normal.  Cardiovascular:     Rate and Rhythm: Normal rate and regular rhythm.     Pulses: Normal pulses.     Heart sounds: Normal heart sounds.  Pulmonary:     Effort: Pulmonary effort is normal.     Breath sounds: Normal breath sounds.  Abdominal:     General: Abdomen is flat.     Palpations: Abdomen is soft.  Musculoskeletal:        General: Normal range of motion.     Cervical back: Normal range of motion.  Lymphadenopathy:     Cervical: Cervical adenopathy present.  Skin:    General: Skin is warm and dry.     Capillary Refill: Capillary refill takes less than 2 seconds.     Findings: No rash.  Neurological:     General: No focal deficit present.     Mental Status: He is alert and oriented to person, place, and time.  Psychiatric:        Mood and Affect: Mood normal.        Behavior: Behavior normal.        Thought Content: Thought content normal.        Judgment: Judgment normal.     BP (!) 143/78   Pulse 60   Temp 97.8 F (36.6 C) (Oral)   Ht 5' 8.11" (1.73 m)   Wt 224 lb 1.6 oz (101.7 kg)   SpO2 97%   BMI 33.96 kg/m  Wt Readings from Last 3 Encounters:  10/03/19 224 lb 1.6 oz (101.7 kg)  06/29/19 225 lb 9.6 oz (102.3 kg)  03/21/19 240 lb 0.6 oz (108.9 kg)    Health Maintenance Due  Topic Date Due  . INFLUENZA VACCINE  09/24/2019    There are no preventive care reminders to display for this patient.   Lab Results   Component Value Date   TSH 5.36 (H) 07/01/2018   Lab Results  Component Value Date   WBC 4.2 07/01/2018   HGB 15.3 07/01/2018   HCT 45.2 07/01/2018   MCV 90.0 07/01/2018   PLT 158 07/01/2018   Lab Results  Component Value Date   NA 140 07/01/2018   K 4.6 07/01/2018   CO2 26 07/01/2018   GLUCOSE 138 07/01/2018   BUN 16 07/01/2018   CREATININE 1.21 07/01/2018   BILITOT 0.5 07/01/2018   ALKPHOS 70 05/21/2017   AST 21 07/01/2018   ALT 25 07/01/2018   PROT 6.7 07/01/2018   ALBUMIN 4.2 10/20/2016   CALCIUM 9.1 07/01/2018   Lab Results  Component Value Date   CHOL 170 09/03/2017   Lab Results  Component Value Date   HDL 46 09/03/2017   Lab Results  Component Value Date   LDLCALC 106 (H) 09/03/2017   Lab Results  Component Value Date   TRIG 89 09/03/2017   Lab Results  Component Value Date   CHOLHDL 3.7 09/03/2017   Lab Results  Component Value Date   HGBA1C 5.8 05/21/2017       Assessment & Plan:  1. Acute maxillary sinusitis, recurrence not specified Symptoms and presentation consistent with acute maxillary sinusitis. Symptoms have been present for 7 days with worsening symptoms despite conservative management. Will treat with 7 days of augmentin.  - amoxicillin-clavulanate (AUGMENTIN) 875-125 MG tablet; Take 1 tablet by mouth 2 (  two) times daily.  Dispense: 14 tablet; Refill: 0  2. Acute otitis media, unspecified otitis media type Acute otitis media to the left ear with clear effusion on the right. Most likely a result of ongoing sinusitis. Will treat with 7 days of augmentin.  - amoxicillin-clavulanate (AUGMENTIN) 875-125 MG tablet; Take 1 tablet by mouth 2 (two) times daily.  Dispense: 14 tablet; Refill: 0  3. Bilateral impacted cerumen Irrigation and manual removal of impacted cerumen bilaterally. Patient tolerated procedure well. No pain, erythema, or lacerations present in the ear canal post procedure. Ear canal clear of cerumen. Hearing restored. No  adverse effects noted.  Discussed use of intermittent hydrogen peroxide soak to ears to help facilitate regular cerumen removal at home.   Follow-up if symptoms persist or fail to improve.    Tollie Eth, NP

## 2019-10-10 ENCOUNTER — Other Ambulatory Visit: Payer: Self-pay | Admitting: Family Medicine

## 2019-10-19 DIAGNOSIS — G4733 Obstructive sleep apnea (adult) (pediatric): Secondary | ICD-10-CM | POA: Diagnosis not present

## 2019-11-08 DIAGNOSIS — F411 Generalized anxiety disorder: Secondary | ICD-10-CM | POA: Diagnosis not present

## 2019-11-08 DIAGNOSIS — F331 Major depressive disorder, recurrent, moderate: Secondary | ICD-10-CM | POA: Diagnosis not present

## 2019-12-19 ENCOUNTER — Other Ambulatory Visit: Payer: Self-pay

## 2019-12-19 ENCOUNTER — Ambulatory Visit (INDEPENDENT_AMBULATORY_CARE_PROVIDER_SITE_OTHER): Payer: BC Managed Care – PPO

## 2019-12-19 ENCOUNTER — Ambulatory Visit (INDEPENDENT_AMBULATORY_CARE_PROVIDER_SITE_OTHER): Payer: BC Managed Care – PPO | Admitting: Sports Medicine

## 2019-12-19 DIAGNOSIS — M25562 Pain in left knee: Secondary | ICD-10-CM

## 2019-12-19 DIAGNOSIS — M2242 Chondromalacia patellae, left knee: Secondary | ICD-10-CM

## 2019-12-19 DIAGNOSIS — M11261 Other chondrocalcinosis, right knee: Secondary | ICD-10-CM | POA: Diagnosis not present

## 2019-12-19 DIAGNOSIS — M1712 Unilateral primary osteoarthritis, left knee: Secondary | ICD-10-CM | POA: Diagnosis not present

## 2019-12-19 NOTE — Assessment & Plan Note (Signed)
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  He has a Magazine features editor coming up, desires repeat injection today. I think we can go ahead and do this, he does have tenderness over the lateral patellar facet, only minimal effusion. Adding repeat x-rays, formal physical therapy, return to see me in 1 month, we can get him approved for viscosupplementation if insufficiently better.

## 2019-12-19 NOTE — Progress Notes (Signed)
    Procedures performed today:    Procedure: Real-time Ultrasound Guided injection of the left knee Device: Samsung HS60  Verbal informed consent obtained.  Time-out conducted.  Noted no overlying erythema, induration, or other signs of local infection.  Skin prepped in a sterile fashion.  Local anesthesia: Topical Ethyl chloride.  With sterile technique and under real time ultrasound guidance:  Noted mild effusion, 1 cc Kenalog 40, 2 cc lidocaine, 2 cc bupivacaine injected easily Completed without difficulty  Pain immediately resolved suggesting accurate placement of the medication.  Advised to call if fevers/chills, erythema, induration, drainage, or persistent bleeding.  Images permanently stored and available for review in PACS.  Impression: Technically successful ultrasound guided injection.  Independent interpretation of notes and tests performed by another provider:   None.  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  He has a Magazine features editor coming up, desires repeat injection today. I think we can go ahead and do this, he does have tenderness over the lateral patellar facet, only minimal effusion. Adding repeat x-rays, formal physical therapy, return to see me in 1 month, we can get him approved for viscosupplementation if insufficiently better.    ___________________________________________ [name]. [name], M.D., ABFM., CAQSM. Primary Care and Sports Medicine [facility]  Adjunct Instructor of Family Medicine  [employer]

## 2019-12-20 ENCOUNTER — Other Ambulatory Visit: Payer: Self-pay

## 2019-12-20 ENCOUNTER — Telehealth: Payer: Self-pay | Admitting: Family Medicine

## 2019-12-20 ENCOUNTER — Emergency Department (INDEPENDENT_AMBULATORY_CARE_PROVIDER_SITE_OTHER)
Admission: EM | Admit: 2019-12-20 | Discharge: 2019-12-20 | Disposition: A | Discharge status: Home / Self Care | Payer: BC Managed Care – PPO | Source: Home / Self Care | Attending: Family Medicine | Admitting: Family Medicine

## 2019-12-20 DIAGNOSIS — R002 Palpitations: Secondary | ICD-10-CM | POA: Diagnosis not present

## 2019-12-20 NOTE — Telephone Encounter (Signed)
Patient states that he thinks he was having an allergic reaction to a steroid shot he got yesterday. States that he was shaky and had sweats and a fever. Per triage patient was to make a virtual visit first. Patient declined and did not want to wait. Stated he was going to UC. Just FYI.

## 2019-12-20 NOTE — ED Triage Notes (Signed)
Pt presents to Urgent Care with c/o sob, palpitations, feeling hot/flushed, and anxious since having cortisone shot in knee yesterday. Reports resting heart rate is 115, but it is normally in the 60s.

## 2019-12-20 NOTE — Telephone Encounter (Signed)
Sounds like just a steroid flare, he can wait it out, this usually goes away in a few hours motrin/aleve

## 2019-12-20 NOTE — Telephone Encounter (Signed)
Forwarding as FYI to Dr. Karie Schwalbe as well since he performed joint injection yesterday.

## 2019-12-20 NOTE — ED Provider Notes (Signed)
[facility]    CSN: [id number] Arrival date & time: 12/20/19  0840      History   Chief Complaint Chief Complaint  Patient presents with  . Palpitations    HPI Scott [name] a 54 y.o. male.   Yesterday patient received an intra-articular injection of Kenalog, lidocaine, and bupivacaine into his left knee.  He slept poorly last night with with chills/sweats, palpitations, and anxious feeling.  Today he measured his pulse at 117.  He denies chest pain or shortness of breath.  He feels slightly less anxious this morning.  The history is provided by the patient.  Palpitations Palpitations quality:  Regular Onset quality:  Sudden Timing:  Intermittent Progression:  Improving Chronicity:  New Context comment:  Left knee injection Relieved by:  None tried Worsened by:  Nothing Ineffective treatments:  None tried Associated symptoms: diaphoresis   Associated symptoms: no chest pain, no chest pressure, no cough, no dizziness, no leg pain, no lower extremity edema, no malaise/fatigue, no nausea, no near-syncope, no numbness, no shortness of breath, no syncope and no weakness     Past Medical History:  Diagnosis Date  . ADHD (attention deficit hyperactivity disorder)   . Allergy   . Appendicitis 1998  . Arthritis   . Asthma    Exercise Induced  . Fracture of wrist 1984  . Fracture, maxillary (HCC) 2009  . GERD (gastroesophageal reflux disease)   . Hyperlipidemia   . Low testosterone 09/15/2017  . PONV (postoperative nausea and vomiting)   . Prediabetes 09/15/2017  . Sleep apnea   . Staph infection 2000 and 2002   staph infections post surgeries appe and vasectomy  . Thyroid disease     Patient Active Problem List   Diagnosis Date Noted  . Ramsay Hunt syndrome (geniculate herpes zoster) 07/01/2018  . Prediabetes 09/15/2017  . Low testosterone 09/15/2017  . Chondromalacia, patella, left 10/07/2016  . Seborrheic dermatitis 12/16/2015  . Headache,  migraine 08/06/2015  . Exercise-induced asthma 08/06/2015  . Attention deficit hyperactivity disorder 07/02/2011  . HLD (hyperlipidemia) 02/20/2011  . Avitaminosis D 02/20/2011  . OSA (obstructive sleep apnea) 12/10/2010  . Adult hypothyroidism 10/29/2010    Past Surgical History:  Procedure Laterality Date  . APPENDECTOMY    . CHONDROPLASTY Right 12/17/2016   Procedure: CHONDROPLASTY;  Surgeon: Loreta Ave, MD;  Location: [facility];  Service: Orthopedics;  Laterality: Right;  . KNEE ARTHROSCOPY Right 12/17/2016   Procedure: KNEE ARTHROSCOPY, REMOVAL OF LOOSE BODIES;  Surgeon: Loreta Ave, MD;  Location: [facility];  Service: Orthopedics;  Laterality: Right;       Home Medications    Prior to Admission medications   Medication Sig Start Date End Date Taking? Authorizing Provider  albuterol (PROAIR HFA) 108 (90 Base) MCG/ACT inhaler Inhale 2 puffs into the lungs every 6 (six) hours as needed for wheezing or shortness of breath. 05/26/16   Rodolph Bong, MD  AMBULATORY NON FORMULARY MEDICATION Adjust continuous positive airway pressure (CPAP) machine to following settings  auto-titrate from 8-20 cm of H2O pressure. Send to [facility] [place].  Fax [phone] phone: [phone] 12/15/18   Rodolph Bong, MD  cholecalciferol (VITAMIN D) 1000 units tablet Take 4,000 Units by mouth daily.    [provider]  diazepam (VALIUM) 10 MG tablet TAKE 1 TABLET BY MOUTH AT BEDTIME AS NEEDED FOR ANXIETY 10/13/19   Everrett Coombe, DO  Erenumab-aooe (AIMOVIG) 140 MG/ML SOAJ Inject 140 mg into  the skin every 30 (thirty) days. 04/27/19   Tollie Eth, NP  MAGNESIUM CITRATE PO Take 250 mg by mouth.    [provider]  Melatonin 3 MG TABS Take 3 mg by mouth.    [provider]  Omega-3 Fatty Acids (FISH OIL) 1000 MG CAPS Take 1,000 mg by mouth.    [provider]  testosterone cypionate (DEPOTESTOSTERONE CYPIONATE) 200 MG/ML  injection INJECT 0.4 MLS WEEKLY AS DIRECTED 05/20/18   [provider]  Thyroid (NATURE-THROID) 113.75 MG TABS Take 112 mcg by mouth.    [provider]    Family History Family History  Problem Relation Age of Onset  . Heart attack Father   . Emphysema Father   . Heart failure Father   . Alcohol abuse Maternal Aunt   . Alcohol abuse Maternal Uncle   . Diabetes Mother   . Hypertension Mother   . Colon cancer Neg Hx   . Esophageal cancer Neg Hx   . Rectal cancer Neg Hx   . Stomach cancer Neg Hx     Social History Social History   Tobacco Use  . Smoking status: Never Smoker  . Smokeless tobacco: Never Used  Vaping Use  . Vaping Use: Never used  Substance Use Topics  . Alcohol use: Yes    Alcohol/week: 1.0 standard drink    Types: 1 Glasses of wine per week    Comment: every several months  . Drug use: No     Allergies   Naproxen   Review of Systems Review of Systems  Constitutional: Positive for diaphoresis. Negative for malaise/fatigue.  Respiratory: Negative for cough and shortness of breath.   Cardiovascular: Positive for palpitations. Negative for chest pain, syncope and near-syncope.  Gastrointestinal: Negative for nausea.  Neurological: Negative for dizziness, weakness and numbness.  All other systems reviewed and are negative.    Physical Exam Triage Vital Signs ED Triage Vitals  Enc Vitals Group     BP 12/20/19 0921 (!) 138/93     Pulse Rate 12/20/19 0921 (!) 109     Resp 12/20/19 0921 20     Temp 12/20/19 0921 98.6 F (37 C)     Temp Source 12/20/19 0921 Oral     SpO2 12/20/19 0921 97 %     Weight --      Height --      Head Circumference --      Peak Flow --      Pain Score 12/20/19 0918 0     Pain Loc --      Pain Edu? --      Excl. in GC? --    No data found.  Updated Vital Signs BP (!) 138/93 (BP Location: Left Arm)   Pulse (!) 109   Temp 98.6 F (37 C) (Oral)   Resp 20   SpO2 97%   Visual Acuity Right Eye  Distance:   Left Eye Distance:   Bilateral Distance:    Right Eye Near:   Left Eye Near:    Bilateral Near:     Physical Exam Vitals and nursing note reviewed.  Constitutional:      General: He is not in acute distress. HENT:     Head: Normocephalic.     Right Ear: External ear normal.     Left Ear: External ear normal.     Mouth/Throat:     Pharynx: Oropharynx is clear.  Eyes:     Conjunctiva/sclera: Conjunctivae normal.  Pupils: Pupils are equal, round, and reactive to light.  Cardiovascular:     Rate and Rhythm: Regular rhythm. Tachycardia present.     Heart sounds: Normal heart sounds.  Pulmonary:     Breath sounds: Normal breath sounds.  Abdominal:     Tenderness: There is no abdominal tenderness.  Musculoskeletal:     Cervical back: Neck supple.     Comments: Left knee without tenderness, erythema, or warmth.  Lymphadenopathy:     Cervical: No cervical adenopathy.  Skin:    General: Skin is warm and dry.     Findings: No rash.  Neurological:     General: No focal deficit present.     Mental Status: He is alert and oriented to person, place, and time.    Nursing notes and Vital Signs reviewed. Appearance:  Patient appears stated age, and in no acute distress.    Eyes:  Pupils are equal, round, and reactive to light and accomodation.  Extraocular movement is intact.  Conjunctivae are not inflamed   Pharynx:  Normal; moist mucous membranes  Neck:  Supple.  No adenopathy or thyromegaly. Lungs:  Clear to auscultation.  Breath sounds are equal.  Moving air well. Heart:  Regular rate and rhythm without murmurs, rubs, or gallops. Rate 109. Abdomen:  Nontender without masses or hepatosplenomegaly.  Bowel sounds are present.  No CVA or flank tenderness.  Extremities:  No edema.  Skin:  No rash present.     UC Treatments / Results  Labs (all labs ordered are listed, but only abnormal results are displayed) Labs Reviewed - No data to display  EKG  Rate:  100  BPM PR:  142 msec QT:  322 msec QTcH:  415 msec QRSD:  80 msec QRS axis:  59 degrees Interpretation:  within normal limits; normal sinus rhythm    Radiology   Procedures Procedures (including critical care time)  Medications Ordered in UC Medications - No data to display  Initial Impression / Assessment and Plan / UC Course  I have reviewed the triage vital signs and the nursing notes.  Pertinent labs & imaging results that were available during my care of the patient were reviewed by me and considered in my medical decision making (see chart for details).    Normal EKG reassuring.  Symptoms probably secondary to intra-articular corticosteroid injection. Followup with Family Doctor if symptoms persist.   Final Clinical Impressions(s) / UC Diagnoses   Final diagnoses:  Palpitations     Discharge Instructions     Rest. Remain well hydrated.  If symptoms become significantly worse during the night or over the weekend, proceed to the local emergency room.     ED Prescriptions    None        Lattie Haw, MD 12/24/19 2023

## 2019-12-20 NOTE — Discharge Instructions (Addendum)
Rest. Remain well hydrated.  If symptoms become significantly worse during the night or over the weekend, proceed to the local emergency room.

## 2019-12-20 NOTE — Telephone Encounter (Signed)
Left message for a return call from patient to see how he is doing.

## 2019-12-21 NOTE — Telephone Encounter (Signed)
Patient left a message stating "it was a rough 24 hours but I'm fine now".  He thanked me for calling to check on him.

## 2019-12-22 ENCOUNTER — Other Ambulatory Visit: Payer: Self-pay | Admitting: Family Medicine

## 2019-12-29 NOTE — Telephone Encounter (Signed)
See patient note, Orthovisc approval or any other viscosupplementation approval, left knee only.

## 2020-01-03 NOTE — Telephone Encounter (Signed)
Submitted Orthovisc for approval waiting on BID and to see if PA is needed - CF 

## 2020-01-08 ENCOUNTER — Ambulatory Visit (INDEPENDENT_AMBULATORY_CARE_PROVIDER_SITE_OTHER): Payer: BC Managed Care – PPO | Admitting: Rehabilitative and Restorative Service Providers"

## 2020-01-08 ENCOUNTER — Other Ambulatory Visit: Payer: Self-pay

## 2020-01-08 DIAGNOSIS — M6281 Muscle weakness (generalized): Secondary | ICD-10-CM

## 2020-01-08 DIAGNOSIS — M25562 Pain in left knee: Secondary | ICD-10-CM

## 2020-01-08 DIAGNOSIS — R29898 Other symptoms and signs involving the musculoskeletal system: Secondary | ICD-10-CM | POA: Diagnosis not present

## 2020-01-08 NOTE — Therapy (Signed)
[facility] [place] [address] [place], [place], [address] Phone: [phone]   Fax:  [phone]  Physical Therapy Evaluation  Patient Details  Name: Scott Fields MRN: [id number] Date of Birth: [birth year] Referring Provider (PT): Rodney Langton, MD   Encounter Date: 01/08/2020   PT End of Session - 01/08/20 0803    Visit Number 1    Number of Visits 12    Date for PT Re-Evaluation 02/19/20    Authorization Type [employer] $80 copay    PT Start Time 0714    PT Stop Time 0801    PT Time Calculation (min) 47 min    Activity Tolerance Patient tolerated treatment well    Behavior During Therapy [facility] for tasks assessed/performed           Past Medical History:  Diagnosis Date  . ADHD (attention deficit hyperactivity disorder)   . Allergy   . Appendicitis 1998  . Arthritis   . Asthma    Exercise Induced  . Fracture of wrist 1984  . Fracture, maxillary (HCC) 2009  . GERD (gastroesophageal reflux disease)   . Hyperlipidemia   . Low testosterone 09/15/2017  . PONV (postoperative nausea and vomiting)   . Prediabetes 09/15/2017  . Sleep apnea   . Staph infection 2000 and 2002   staph infections post surgeries appe and vasectomy  . Thyroid disease     Past Surgical History:  Procedure Laterality Date  . APPENDECTOMY    . CHONDROPLASTY Right 12/17/2016   Procedure: CHONDROPLASTY;  Surgeon: Loreta Ave, MD;  Location: [facility];  Service: Orthopedics;  Laterality: Right;  . KNEE ARTHROSCOPY Right 12/17/2016   Procedure: KNEE ARTHROSCOPY, REMOVAL OF LOOSE BODIES;  Surgeon: Loreta Ave, MD;  Location: [facility];  Service: Orthopedics;  Laterality: Right;    There were no vitals filed for this visit.    Subjective Assessment - 01/08/20 0712    Subjective The patient reports he has had 10+ year h/o L knee pain.  He gets lateral/superior pain,  He reports a prior surgery in which  they were going to replace the patella, but realized he would need a knee replacement-- he wishes to wait until he is in his 69s to do a TKR.  He went to a state tennis tournament and played multiple games and he has not been able to recover. At this time, he is not able to return to tennis.  Steps (down), walking fast, and occasional sensation of knee locking.  When the knee locks he gets 8/10 pain that is short in duration, but intense.    Pertinent History migraine, prediabetes    Patient Stated Goals Return to tennis    Currently in Pain? Yes    Pain Score 3     Pain Location Knee    Pain Orientation Left    Pain Descriptors / Indicators Aching;Tightness    Pain Type Chronic pain    Pain Onset More than a month ago    Aggravating Factors  lunges, stairs              [facility] PT Assessment - 01/08/20 0725      Assessment   Medical Diagnosis L chondromalacia patella    Referring Provider (PT) Rodney Langton, MD    Onset Date/Surgical Date 12/19/19    Hand Dominance Left   sports R handed   Prior Therapy none      Precautions   Precautions None  Restrictions   Weight Bearing Restrictions No      Balance Screen   Has the patient fallen in the past 6 months No    Has the patient had a decrease in activity level because of a fear of falling?  No    Is the patient reluctant to leave their home because of a fear of falling?  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      Observation/Other Assessments   Focus on Therapeutic Outcomes (FOTO)  42% limitation      Sensation   Light Touch Appears Intact      ROM / Strength   AROM / PROM / Strength AROM;Strength      AROM   Overall AROM  Within functional limits for tasks performed     AROM Assessment Site Knee    Right/Left Knee Left    Left Knee Flexion 118      Strength   Overall Strength Within functional limits for tasks performed    Strength Assessment Site Hip;Knee;Ankle    Right/Left Hip Right;Left    Right Hip Flexion 5/5    Left Hip Flexion 5/5    Right/Left Knee Right;Left    Right Knee Flexion 5/5    Right Knee Extension 5/5    Left Knee Flexion 5/5    Left Knee Extension 5/5    Right/Left Ankle Right;Left    Right Ankle Dorsiflexion 5/5    Left Ankle Dorsiflexion 5/5      Flexibility   Soft Tissue Assessment /Muscle Length yes    Hamstrings tightness bilaterally    Quadriceps tightness bilaterally with prone stretch to 100 degrees knee flexion      Palpation   Palpation comment tenderness to palpation of L lateral hamstring      Special Tests    Special Tests Knee Special Tests    Knee Special tests  Patellofemoral Grind Test (Clarke's Sign)      Patellofemoral Grind test (Clark's Sign)   Findings Negative    Side  Left    Comments some discomfort with lateral patella hypermobility                      Objective measurements completed on examination: See above findings.       [facility] Adult PT Treatment/Exercise - 01/08/20 0802      Exercises   Exercises Knee/Hip      Knee/Hip Exercises: Stretches   Active Hamstring Stretch 1 rep;Left;30 seconds    Quad Stretch Right;Left;1 rep;30 seconds      Knee/Hip Exercises: Standing   Heel Raises Left;Right;10 reps    Forward Lunges Left;Right;5 reps    Forward Lunges Limitations split lunges    Lateral Step Up Left;5 reps    Lateral Step Up Limitations painful on 2" surface    Wall Squat 10 reps    Wall Squat Limitations mini squat ot begin                  PT Education - 01/08/20 0753    Education Details HEP    Person(s) Educated Patient    Methods Explanation;Demonstration;Handout    Comprehension Verbalized understanding;Returned demonstration  PT Long Term Goals - 01/08/20 0854      PT LONG TERM GOAL #1   Title The patient will be indep with HEP L knee.    Time 6    Period Weeks    Target Date 02/19/20      PT LONG TERM GOAL #2   Title The patient will reduce functional limitation per FOTO from 42% to < or equal to 32%.    Time 6    Period Weeks    Target Date 02/19/20      PT LONG TERM GOAL #3   Title The patient will reduce pain with stair negotiation to < or equal to 2/10.    Time 6    Period Weeks    Target Date 02/19/20      PT LONG TERM GOAL #4   Title The patient will return to doubles tennis match with pain < or equal to 2/10.    Time 6    Period Weeks    Target Date 02/19/20      PT LONG TERM GOAL #5   Title The patient will verbalize understanding of gym routine for continued LE strengthening to manage chronic pain.    Time 6    Period Weeks    Target Date 02/19/20                  Plan - 01/08/20 [id number]    Clinical Impression Statement The patient is a 54 yo male presenting to OP physical therapy with L chondromalacia patella.  He has impairments in muscle length with tightness in quads/HS, tenderness to palpation, dec'd flexibility, hypermobility patella leading to dec'd ability to perform work related tasks and recreational activities.  PT to address deficits to return to prior activity status.    Personal Factors and Comorbidities Comorbidity 1    Comorbidities migraines    Examination-Activity Limitations Squat;Stairs;Stand;Locomotion Level    Examination-Participation Restrictions Other   recreational activities   Stability/Clinical Decision Making Stable/Uncomplicated    Clinical Decision Making Low    Rehab Potential Good    PT Frequency 2x / week    PT Duration 6 weeks    PT Treatment/Interventions ADLs/Self Care Home Management;Gait training;Functional mobility training;Patient/family education;Therapeutic activities;Therapeutic exercise;Electrical Stimulation;Moist  Heat;Iontophoresis 4mg /ml Dexamethasone;Manual techniques;Dry needling;Taping;Stair training;Neuromuscular re-education    PT Next Visit Plan lateral HS DN and lateral quad, flexibility, strengthening, standing balance L LE    PT Home Exercise Plan Access Code: [email or web address]    Consulted and Agree with Plan of Care Patient           Patient will benefit from skilled therapeutic intervention in order to improve the following deficits and impairments:  Pain, Postural dysfunction, Impaired flexibility, Hypermobility, Decreased strength, Increased fascial restricitons, Decreased activity tolerance  Visit Diagnosis: Acute pain of left knee  Muscle weakness (generalized)  Other symptoms and signs involving the musculoskeletal system     Problem List Patient Active Problem List   Diagnosis Date Noted  . Ramsay Hunt syndrome (geniculate herpes zoster) 07/01/2018  . Prediabetes 09/15/2017  . Low testosterone 09/15/2017  . Chondromalacia, patella, left 10/07/2016  . Seborrheic dermatitis 12/16/2015  . Headache, migraine 08/06/2015  . Exercise-induced asthma 08/06/2015  . Attention deficit hyperactivity disorder 07/02/2011  . HLD (hyperlipidemia) 02/20/2011  . Avitaminosis D 02/20/2011  . OSA (obstructive sleep apnea) 12/10/2010  . Adult hypothyroidism 10/29/2010    Axelle Szwed , PT 01/08/2020, 9:03 AM  [facility] [address] [place], [place], [address] Phone: [phone]   Fax:  [phone]  Name: Lovelle Lema MRN: [id number] Date of Birth: [birth year]

## 2020-01-08 NOTE — Patient Instructions (Signed)
Access Code: [email or web address] URL: [email or web address] Date: 01/08/2020 Prepared by: Margretta Ditty  Exercises Prone Quadriceps Stretch with Strap - 2 x daily - 7 x weekly - 1 sets - 3 reps - 30 seconds hold Seated Hamstring Stretch with Chair - 2 x daily - 7 x weekly - 1 sets - 3 reps - 30 seconds hold Standing Single Leg Heel Raise - 2 x daily - 7 x weekly - 1 sets - 10-20 reps Wall Squat - 2 x daily - 7 x weekly - 1 sets - 10-15 reps Mini Lunge - 2 x daily - 7 x weekly - 1 sets - 5-10 reps

## 2020-01-11 DIAGNOSIS — F4323 Adjustment disorder with mixed anxiety and depressed mood: Secondary | ICD-10-CM | POA: Diagnosis not present

## 2020-01-11 DIAGNOSIS — F902 Attention-deficit hyperactivity disorder, combined type: Secondary | ICD-10-CM | POA: Diagnosis not present

## 2020-01-15 ENCOUNTER — Encounter: Payer: Self-pay | Admitting: Family Medicine

## 2020-01-15 ENCOUNTER — Other Ambulatory Visit: Payer: Self-pay

## 2020-01-15 ENCOUNTER — Ambulatory Visit (INDEPENDENT_AMBULATORY_CARE_PROVIDER_SITE_OTHER): Payer: BC Managed Care – PPO | Admitting: Rehabilitative and Restorative Service Providers"

## 2020-01-15 DIAGNOSIS — M25562 Pain in left knee: Secondary | ICD-10-CM | POA: Diagnosis not present

## 2020-01-15 DIAGNOSIS — M6281 Muscle weakness (generalized): Secondary | ICD-10-CM

## 2020-01-15 DIAGNOSIS — R29898 Other symptoms and signs involving the musculoskeletal system: Secondary | ICD-10-CM | POA: Diagnosis not present

## 2020-01-15 NOTE — Patient Instructions (Signed)
Access Code: [email or web address] URL: [email or web address] Date: 01/15/2020 Prepared by: Margretta Ditty  Exercises Prone Quadriceps Stretch with Strap - 2 x daily - 7 x weekly - 1 sets - 3 reps - 30 seconds hold Seated Hamstring Stretch with Chair - 2 x daily - 7 x weekly - 1 sets - 3 reps - 30 seconds hold Standing Single Leg Heel Raise - 2 x daily - 7 x weekly - 1 sets - 10-20 reps Wall Squat - 2 x daily - 7 x weekly - 1 sets - 10-15 reps Mini Lunge - 2 x daily - 7 x weekly - 1 sets - 5-10 reps Supine ITB Stretch - 2 x daily - 7 x weekly - 1 sets - 3 reps - 20 seconds hold IT Band Mobilization with Small Ball - 2 x daily - 7 x weekly - 1 sets - 1 reps - 2 minutes hold

## 2020-01-15 NOTE — Therapy (Signed)
[facility] [place] [address] [place], [place], [address] Phone: [phone]   Fax:  [phone]  Physical Therapy Treatment  Patient Details  Name: Scott Fields MRN: [id number] Date of Birth: Oct 19, 1965 Referring Provider (PT): Rodney Langton, MD   Encounter Date: 01/15/2020   PT End of Session - 01/15/20 0800    Visit Number 2    Number of Visits 12    Date for PT Re-Evaluation 02/19/20    Authorization Type BCBS $80 copay    PT Start Time 0718    PT Stop Time 0801    PT Time Calculation (min) 43 min    Activity Tolerance Patient tolerated treatment well    Behavior During Therapy [facility] for tasks assessed/performed           Past Medical History:  Diagnosis Date  . ADHD (attention deficit hyperactivity disorder)   . Allergy   . Appendicitis 1998  . Arthritis   . Asthma    Exercise Induced  . Fracture of wrist 1984  . Fracture, maxillary (HCC) 2009  . GERD (gastroesophageal reflux disease)   . Hyperlipidemia   . Low testosterone 09/15/2017  . PONV (postoperative nausea and vomiting)   . Prediabetes 09/15/2017  . Sleep apnea   . Staph infection 2000 and 2002   staph infections post surgeries appe and vasectomy  . Thyroid disease     Past Surgical History:  Procedure Laterality Date  . APPENDECTOMY    . CHONDROPLASTY Right 12/17/2016   Procedure: CHONDROPLASTY;  Surgeon: Loreta Ave, MD;  Location: [facility];  Service: Orthopedics;  Laterality: Right;  . KNEE ARTHROSCOPY Right 12/17/2016   Procedure: KNEE ARTHROSCOPY, REMOVAL OF LOOSE BODIES;  Surgeon: Loreta Ave, MD;  Location: [facility];  Service: Orthopedics;  Laterality: Right;    There were no vitals filed for this visit.   Subjective Assessment - 01/15/20 0720    Subjective The patient reports knee locks at times.  He had a stomach bug last week so he did not begin ther ex until Thursday.    Pertinent  History migraine, prediabetes    Patient Stated Goals Return to tennis    Currently in Pain? No/denies              [facility] PT Assessment - 01/15/20 2993      Assessment   Medical Diagnosis L chondromalacia patella    Referring Provider (PT) Rodney Langton, MD    Onset Date/Surgical Date 12/19/19    Hand Dominance Left                         [facility] Adult PT Treatment/Exercise - 01/15/20 0722      Exercises   Exercises Knee/Hip      Knee/Hip Exercises: Stretches   Active Hamstring Stretch 1 rep;Left;30 seconds    Quad Stretch Right;Left;1 rep;30 seconds    ITB Stretch Right;Left;1 rep;30 seconds    Other Knee/Hip Stretches sidelying self mobilization on foam roller Fields'ing weight through LEs.        Knee/Hip Exercises: Aerobic   Recumbent Bike 3 minutes at level 6       Knee/Hip Exercises: Standing   Heel Raises 15 reps;Right;Left    Abduction Limitations side stepping 20 ft x 4 reps R and L sides    Lateral Step Up Left;5 reps    Lateral Step Up Limitations painful on 1" compliant foam in superior lateral  knee    SLS loading into the diver with smaller ROM    SLS with Vectors lateral vectors in single leg stance    Other Standing Knee Exercises standing marching with yoga block overhead for core engagement      Knee/Hip Exercises: Supine   Bridges with [employer] Squeeze Strengthening;Both;15 reps      Manual Therapy   Manual Therapy Soft tissue mobilization    Manual therapy comments skilled palpation to assess soft tissue response to dry needling and soft tissue mobilization    Soft tissue mobilization STM and self rolling wiht massage stick on L IT band, L quads, L lateral hamstring            Trigger Point Dry Needling - 01/15/20 0723    Consent Given? Yes    Education Handout Provided Yes    Muscles Treated Lower Quadrant Hamstring;Vastus lateralis    Vastus lateralis Response Twitch response elicited;Palpable increased muscle length     Hamstring Response Twitch response elicited;Palpable increased muscle length                PT Education - 01/15/20 0800    Education Details modified HEP    Person(s) Educated Patient    Methods Explanation;Demonstration;Handout    Comprehension Verbalized understanding;Returned demonstration               PT Long Term Goals - 01/08/20 0854      PT LONG TERM GOAL #1   Title The patient will be indep with HEP L knee.    Time 6    Period Weeks    Target Date 02/19/20      PT LONG TERM GOAL #2   Title The patient will reduce functional limitation per FOTO from 42% to < or equal to 32%.    Time 6    Period Weeks    Target Date 02/19/20      PT LONG TERM GOAL #3   Title The patient will reduce pain with stair negotiation to < or equal to 2/10.    Time 6    Period Weeks    Target Date 02/19/20      PT LONG TERM GOAL #4   Title The patient will return to doubles tennis match with pain < or equal to 2/10.    Time 6    Period Weeks    Target Date 02/19/20      PT LONG TERM GOAL #5   Title The patient will verbalize understanding of gym routine for continued LE strengthening to manage chronic pain.    Time 6    Period Weeks    Target Date 02/19/20                 Plan - 01/15/20 1627    Clinical Impression Statement The patient has significant reduction in pain after stretching and DN.  PT added ITB stretch to HEP. PT to continue to progress to patient tolerance working towards eventual return to play for tennis.    PT Treatment/Interventions ADLs/Self Care Home Management;Gait training;Functional mobility training;Patient/family education;Therapeutic activities;Therapeutic exercise;Electrical Stimulation;Moist Heat;Iontophoresis 4mg /ml Dexamethasone;Manual techniques;Dry needling;Taping;Stair training;Neuromuscular re-education    PT Next Visit Plan STM and DN if needed, flexibility, strengthening, standing balance L LE    PT Home Exercise Plan Access Code:     Consulted and Agree with Plan of Care Patient           Patient will benefit from skilled therapeutic intervention in order to  improve the following deficits and impairments:     Visit Diagnosis: Acute pain of left knee  Muscle weakness (generalized)  Other symptoms and signs involving the musculoskeletal system     Problem List Patient Active Problem List   Diagnosis Date Noted  . Ramsay Hunt syndrome (geniculate herpes zoster) 07/01/2018  . Prediabetes 09/15/2017  . Low testosterone 09/15/2017  . Chondromalacia, patella, left 10/07/2016  . Seborrheic dermatitis 12/16/2015  . Headache, migraine 08/06/2015  . Exercise-induced asthma 08/06/2015  . Attention deficit hyperactivity disorder 07/02/2011  . HLD (hyperlipidemia) 02/20/2011  . Avitaminosis D 02/20/2011  . OSA (obstructive sleep apnea) 12/10/2010  . Adult hypothyroidism 10/29/2010    Jettie Lazare, PT 01/15/2020, 4:30 PM  [facility] [address] [place], [place], [address] Phone: [phone]   Fax:  [phone]  Name: Scott Fields MRN: [id number] Date of Birth: 12/08/1965

## 2020-01-22 ENCOUNTER — Ambulatory Visit (INDEPENDENT_AMBULATORY_CARE_PROVIDER_SITE_OTHER): Payer: BC Managed Care – PPO | Admitting: Rehabilitative and Restorative Service Providers"

## 2020-01-22 ENCOUNTER — Encounter: Payer: Self-pay | Admitting: Rehabilitative and Restorative Service Providers"

## 2020-01-22 ENCOUNTER — Other Ambulatory Visit: Payer: Self-pay

## 2020-01-22 DIAGNOSIS — R29898 Other symptoms and signs involving the musculoskeletal system: Secondary | ICD-10-CM | POA: Diagnosis not present

## 2020-01-22 DIAGNOSIS — M25562 Pain in left knee: Secondary | ICD-10-CM

## 2020-01-22 DIAGNOSIS — M6281 Muscle weakness (generalized): Secondary | ICD-10-CM | POA: Diagnosis not present

## 2020-01-22 NOTE — Therapy (Signed)
[facility] [place] [address] [place] [address] [place], [place], [address] Phone: [phone]   Fax:  [phone]  Physical Therapy Treatment  Patient Details  Name: Scott Fields MRN: [id number] Date of Birth: 10-08-1965 Referring Provider (PT): Rodney Langton, MD   Encounter Date: 01/22/2020   PT End of Session - 01/22/20 1158    Visit Number 3    Number of Visits 12    Date for PT Re-Evaluation 02/19/20    Authorization Type [employer] $80 copay    PT Start Time 1155    PT Stop Time 1235    PT Time Calculation (min) 40 min    Activity Tolerance Patient tolerated treatment well    Behavior During Therapy [facility] for tasks assessed/performed           Past Medical History:  Diagnosis Date   ADHD (attention deficit hyperactivity disorder)    Allergy    Appendicitis 1998   Arthritis    Asthma    Exercise Induced   Fracture of wrist 1984   Fracture, maxillary (HCC) 2009   GERD (gastroesophageal reflux disease)    Hyperlipidemia    Low testosterone 09/15/2017   PONV (postoperative nausea and vomiting)    Prediabetes 09/15/2017   Sleep apnea    Staph infection 2000 and 2002   staph infections post surgeries appe and vasectomy   Thyroid disease     Past Surgical History:  Procedure Laterality Date   APPENDECTOMY     CHONDROPLASTY Right 12/17/2016   Procedure: CHONDROPLASTY;  Surgeon: Loreta Ave, MD;  Location: [facility];  Service: Orthopedics;  Laterality: Right;   KNEE ARTHROSCOPY Right 12/17/2016   Procedure: KNEE ARTHROSCOPY, REMOVAL OF LOOSE BODIES;  Surgeon: Loreta Ave, MD;  Location: [facility];  Service: Orthopedics;  Laterality: Right;    There were no vitals filed for this visit.   Subjective Assessment - 01/22/20 1156    Subjective The patient continues to have pain mostly when going down steps with occasional locking.  He has not had time to do IT Band  stretching.  He had initial soreness after DN that improved the day after therapy.    Pertinent History migraine, prediabetes    Patient Stated Goals Return to tennis    Currently in Pain? Yes    Pain Score 0-No pain   3-4/10 on steps   Pain Location Knee    Pain Orientation Left    Pain Descriptors / Indicators Aching;Tightness    Pain Type Chronic pain    Pain Onset More than a month ago    Pain Frequency Intermittent    Aggravating Factors  lunges, stairs    Pain Relieving Factors rest              [facility] PT Assessment - 01/22/20 1159      Assessment   Medical Diagnosis L chondromalacia patella    Referring Provider (PT) Rodney Langton, MD    Onset Date/Surgical Date 12/19/19    Hand Dominance Left                         [facility] Adult PT Treatment/Exercise - 01/22/20 1159      Exercises   Exercises Knee/Hip      Knee/Hip Exercises: Stretches   Active Hamstring Stretch Right;Left;1 rep;30 seconds    Quad Stretch Right;Left;1 rep;30 seconds    Other Knee/Hip Stretches sidelyign self mobilization on foam roller  Knee/Hip Exercises: Aerobic   Tread Mill 4 minutes on treadmill at 3.5 mph      Knee/Hip Exercises: Plyometrics   Other Plyometric Exercises agility ladder beginning with wide to narrow marching, then worked on fast feet with wide to narrow *increases pain    Other Plyometric Exercises performed diagonal stepping and then worked on icreasing speed and making more plyometric *increases pain      Knee/Hip Exercises: Standing   Abduction Limitations side stepping 20 ft x 4 reps R and L sides    Lateral Step Up Left;5 reps;3 sets    Lateral Step Up Limitations to 2" step, on compliant 1" foam, and onto 4" step    Forward Step Up Left;10 reps    Forward Step Up Limitations onto 2" surface    SLS loading into the diver with smaller ROM    SLS with Vectors anterior, lateral, posterior    Other Standing Knee Exercises standing high marches  working on core engagement    Other Standing Knee Exercises jumping in place with bilat LEs                       PT Long Term Goals - 01/08/20 0854      PT LONG TERM GOAL #1   Title The patient will be indep with HEP L knee.    Time 6    Period Weeks    Target Date 02/19/20      PT LONG TERM GOAL #2   Title The patient will reduce functional limitation per FOTO from 42% to < or equal to 32%.    Time 6    Period Weeks    Target Date 02/19/20      PT LONG TERM GOAL #3   Title The patient will reduce pain with stair negotiation to < or equal to 2/10.    Time 6    Period Weeks    Target Date 02/19/20      PT LONG TERM GOAL #4   Title The patient will return to doubles tennis match with pain < or equal to 2/10.    Time 6    Period Weeks    Target Date 02/19/20      PT LONG TERM GOAL #5   Title The patient will verbalize understanding of gym routine for continued LE strengthening to manage chronic pain.    Time 6    Period Weeks    Target Date 02/19/20                 Plan - 01/22/20 1245    Clinical Impression Statement The patient is tolerating increased activity in therapy and notes improvement.  Descending stairs and squats still provoke knee pain.  PT working on [employer] t/o L LE for return to sport.    PT Treatment/Interventions ADLs/Self Care Home Management;Gait training;Functional mobility training;Patient/family education;Therapeutic activities;Therapeutic exercise;Electrical Stimulation;Moist Heat;Iontophoresis 4mg /ml Dexamethasone;Manual techniques;Dry needling;Taping;Stair training;Neuromuscular re-education    PT Next Visit Plan STM and DN if needed, flexibility, strengthening, standing balance L LE    PT Home Exercise Plan Access Code:    Consulted and Agree with Plan of Care Patient           Patient will benefit from skilled therapeutic intervention in order to improve the following deficits and impairments:  Pain, Postural  dysfunction, Impaired flexibility, Hypermobility, Decreased strength, Increased fascial restricitons, Decreased activity tolerance  Visit Diagnosis: Acute pain of left knee  Muscle weakness (  generalized)  Other symptoms and signs involving the musculoskeletal system     Problem List Patient Active Problem List   Diagnosis Date Noted   Ramsay Hunt syndrome (geniculate herpes zoster) 07/01/2018   Prediabetes 09/15/2017   Low testosterone 09/15/2017   Chondromalacia, patella, left 10/07/2016   Seborrheic dermatitis 12/16/2015   Headache, migraine 08/06/2015   Exercise-induced asthma 08/06/2015   Attention deficit hyperactivity disorder 07/02/2011   HLD (hyperlipidemia) 02/20/2011   Avitaminosis D 02/20/2011   OSA (obstructive sleep apnea) 12/10/2010   Adult hypothyroidism 10/29/2010    Derak Schurman , PT 01/22/2020, 12:46 PM  [facility] [address] [place] [place], [place], [address] Phone: [phone]   Fax:  [phone]  Name: Scott Fields MRN: [id number] Date of Birth: 27-May-1965

## 2020-01-23 ENCOUNTER — Encounter: Payer: Self-pay | Admitting: Family Medicine

## 2020-01-23 ENCOUNTER — Ambulatory Visit (INDEPENDENT_AMBULATORY_CARE_PROVIDER_SITE_OTHER): Payer: BC Managed Care – PPO | Admitting: Family Medicine

## 2020-01-23 DIAGNOSIS — F902 Attention-deficit hyperactivity disorder, combined type: Secondary | ICD-10-CM | POA: Diagnosis not present

## 2020-01-23 MED ORDER — DEXMETHYLPHENIDATE HCL ER 10 MG PO CP24: 10.0000 mg | ORAL_CAPSULE | Freq: Every day | ORAL | 0 refills | Status: DC

## 2020-01-23 NOTE — Assessment & Plan Note (Addendum)
He had good control of symptoms with focalin xr previously. PDMP and previous records reviewed.  Will restart at 10mg  daily.  He will let me know how he is doing via in about 1 month.  I will plan to see him again in 3 months.

## 2020-01-23 NOTE — Progress Notes (Signed)
Scott Fields - 54 y.o. male MRN [id number]  Date of birth: 18-Sep-1965  Subjective Chief Complaint  Patient presents with  . ADHD    HPI Scott [name] a 54 y.o. male here today to discuss ADHD.  He has history of ADHD that was previously managed by psychiatry.  He was treated with Focalin XR.  He initially started at 20mg  but found that this was too much.  He last took 10mg .  Unfortunately he had an insurance change and was unable to continue with psychiatry.  He has been off medication for a few years but has found that he is having difficulty with concentration at work once again.  He would like to restart this medication.  He did have some mild side effects with this previously which improved in dose reduction.   ROS:  A comprehensive ROS was completed and negative except as noted per HPI    Allergies  Allergen Reactions  . Naproxen Nausea And Vomiting    Past Medical History:  Diagnosis Date  . ADHD (attention deficit hyperactivity disorder)   . Allergy   . Appendicitis 1998  . Arthritis   . Asthma    Exercise Induced  . Fracture of wrist 1984  . Fracture, maxillary (HCC) 2009  . GERD (gastroesophageal reflux disease)   . Hyperlipidemia   . Low testosterone 09/15/2017  . PONV (postoperative nausea and vomiting)   . Prediabetes 09/15/2017  . Sleep apnea   . Staph infection 2000 and 2002   staph infections post surgeries appe and vasectomy  . Thyroid disease     Past Surgical History:  Procedure Laterality Date  . APPENDECTOMY    . CHONDROPLASTY Right 12/17/2016   Procedure: CHONDROPLASTY;  Surgeon: 2003, MD;  Location: [facility];  Service: Orthopedics;  Laterality: Right;  . KNEE ARTHROSCOPY Right 12/17/2016   Procedure: KNEE ARTHROSCOPY, REMOVAL OF LOOSE BODIES;  Surgeon: Loreta Ave, MD;  Location: [facility];  Service: Orthopedics;  Laterality: Right;    Social History   Socioeconomic History  . Marital status:  Married    Spouse name: Not on file  . Number of children: Not on file  . Years of education: Not on file  . Highest education level: Not on file  Occupational History  . Not on file  Tobacco Use  . Smoking status: Never Smoker  . Smokeless tobacco: Never Used  Vaping Use  . Vaping Use: Never used  Substance and Sexual Activity  . Alcohol use: Yes    Alcohol/week: 1.0 standard drink    Types: 1 Glasses of wine per week    Comment: every several months  . Drug use: No  . Sexual activity: Yes    Partners: Female  Other Topics Concern  . Not on file  Social History Narrative  . Not on file   Social Determinants of Health   Financial Resource Strain:   . Difficulty of Paying Living Expenses: Not on file  Food Insecurity:   . Worried About 12/19/2016 in the Last Year: Not on file  . Ran Out of Food in the Last Year: Not on file  Transportation Needs:   . Lack of Transportation (Medical): Not on file  . Lack of Transportation (Non-Medical): Not on file  Physical Activity:   . Days of Exercise per Week: Not on file  . Minutes of Exercise per Session: Not on file  Stress:   . Feeling of Stress :  Not on file  Social Connections:   . Frequency of Communication with Friends and Family: Not on file  . Frequency of Social Gatherings with Friends and Family: Not on file  . Attends Religious Services: Not on file  . Active Member of Clubs or Organizations: Not on file  . Attends Banker Meetings: Not on file  . Marital Status: Not on file    Family History  Problem Relation Age of Onset  . Heart attack Father   . Emphysema Father   . Heart failure Father   . Alcohol abuse Maternal Aunt   . Alcohol abuse Maternal Uncle   . Diabetes Mother   . Hypertension Mother   . Colon cancer Neg Hx   . Esophageal cancer Neg Hx   . Rectal cancer Neg Hx   . Stomach cancer Neg Hx     Health Maintenance  Topic Date Due  . Fecal DNA (Cologuard)  Never done  .  INFLUENZA VACCINE  09/24/2019  . TETANUS/TDAP  04/08/2025  . COVID-19 Vaccine  Completed  . Hepatitis C Screening  Completed  . HIV Screening  Completed     ----------------------------------------------------------------------------------------------------------------------------------------------------------------------------------------------------------------- Physical Exam BP 132/79 (BP Location: Left Arm, Patient Position: Sitting, Cuff Size: Large)   Pulse 68   Wt 230 lb (104.3 kg)   SpO2 97%   BMI 34.86 kg/m   Physical Exam Constitutional:      Appearance: Normal appearance.  HENT:     Head: Normocephalic and atraumatic.  Eyes:     General: No scleral icterus. Cardiovascular:     Rate and Rhythm: Normal rate and regular rhythm.  Pulmonary:     Effort: Pulmonary effort is normal.     Breath sounds: Normal breath sounds.  Neurological:     General: No focal deficit present.     Mental Status: He is alert.  Psychiatric:        Mood and Affect: Mood normal.     ------------------------------------------------------------------------------------------------------------------------------------------------------------------------------------------------------------------- Assessment and Plan  Attention deficit hyperactivity disorder He had good control of symptoms with focalin xr previously. PDMP and previous records reviewed.  Will restart at 10mg  daily.  He will let me know how he is doing via in about 1 month.  I will plan to see him again in 3 months.    Meds ordered this encounter  Medications  . dexmethylphenidate (FOCALIN XR) 10 MG 24 hr capsule    Sig: Take 1 capsule (10 mg total) by mouth daily.    Dispense:  30 capsule    Refill:  0    No follow-ups on file.    This visit occurred during the SARS-CoV-2 public health emergency.  Safety protocols were in place, including screening questions prior to the visit, additional usage of  staff PPE, and extensive cleaning of exam room while observing appropriate contact time as indicated for disinfecting solutions.

## 2020-01-25 ENCOUNTER — Other Ambulatory Visit: Payer: Self-pay | Admitting: Family Medicine

## 2020-01-25 ENCOUNTER — Encounter: Payer: Self-pay | Admitting: Family Medicine

## 2020-01-25 NOTE — Telephone Encounter (Signed)
Prior Authorization for Focalin submitted via covermymeds. Awaiting response.

## 2020-01-26 DIAGNOSIS — F32A Depression, unspecified: Secondary | ICD-10-CM | POA: Diagnosis not present

## 2020-01-26 DIAGNOSIS — Z1589 Genetic susceptibility to other disease: Secondary | ICD-10-CM | POA: Diagnosis not present

## 2020-01-26 DIAGNOSIS — E039 Hypothyroidism, unspecified: Secondary | ICD-10-CM | POA: Diagnosis not present

## 2020-01-26 DIAGNOSIS — R7989 Other specified abnormal findings of blood chemistry: Secondary | ICD-10-CM | POA: Diagnosis not present

## 2020-01-26 DIAGNOSIS — R7303 Prediabetes: Secondary | ICD-10-CM | POA: Diagnosis not present

## 2020-01-29 ENCOUNTER — Ambulatory Visit (INDEPENDENT_AMBULATORY_CARE_PROVIDER_SITE_OTHER): Payer: BC Managed Care – PPO | Admitting: Rehabilitative and Restorative Service Providers"

## 2020-01-29 ENCOUNTER — Other Ambulatory Visit: Payer: Self-pay

## 2020-01-29 DIAGNOSIS — R29898 Other symptoms and signs involving the musculoskeletal system: Secondary | ICD-10-CM

## 2020-01-29 DIAGNOSIS — M6281 Muscle weakness (generalized): Secondary | ICD-10-CM

## 2020-01-29 DIAGNOSIS — M25562 Pain in left knee: Secondary | ICD-10-CM | POA: Diagnosis not present

## 2020-01-29 NOTE — Patient Instructions (Signed)
Access Code: [email or web address] URL: [email or web address] Date: 01/29/2020 Prepared by: Margretta Ditty  Exercises Prone Quadriceps Stretch with Strap - 2 x daily - 7 x weekly - 1 sets - 3 reps - 30 seconds hold Seated Hamstring Stretch with Chair - 2 x daily - 7 x weekly - 1 sets - 3 reps - 30 seconds hold Standing Single Leg Heel Raise - 2 x daily - 7 x weekly - 1 sets - 10-20 reps Wall Squat - 2 x daily - 7 x weekly - 1 sets - 10-15 reps Mini Lunge - 2 x daily - 7 x weekly - 1 sets - 5-10 reps Supine ITB Stretch - 2 x daily - 7 x weekly - 1 sets - 3 reps - 20 seconds hold IT Band Mobilization with Small Ball - 2 x daily - 7 x weekly - 1 sets - 1 reps - 2 minutes hold Standing Shoulder and Trunk Flexion at Table - 2 x daily - 7 x weekly - 1 sets - 3 reps - 30 seconds hold

## 2020-01-29 NOTE — Therapy (Signed)
[facility] [address] [place], [place], [address] Phone: [phone]   Fax:  [phone]  Physical Therapy Treatment  Patient Details  Name: Scott Fields MRN: [id number] Date of Birth: 1966/01/04 Referring Provider (PT): Rodney Langton, MD   Encounter Date: 01/29/2020   PT End of Session - 01/29/20 1157    Visit Number 4    Number of Visits 12    Date for PT Re-Evaluation 02/19/20    Authorization Type [employer] $80 copay    PT Start Time 1154    PT Stop Time 1234    PT Time Calculation (min) 40 min    Activity Tolerance Patient tolerated treatment well    Behavior During Therapy [facility] for tasks assessed/performed           Past Medical History:  Diagnosis Date  . ADHD (attention deficit hyperactivity disorder)   . Allergy   . Appendicitis 1998  . Arthritis   . Asthma    Exercise Induced  . Fracture of wrist 1984  . Fracture, maxillary (HCC) 2009  . GERD (gastroesophageal reflux disease)   . Hyperlipidemia   . Low testosterone 09/15/2017  . PONV (postoperative nausea and vomiting)   . Prediabetes 09/15/2017  . Sleep apnea   . Staph infection 2000 and 2002   staph infections post surgeries appe and vasectomy  . Thyroid disease     Past Surgical History:  Procedure Laterality Date  . APPENDECTOMY    . CHONDROPLASTY Right 12/17/2016   Procedure: CHONDROPLASTY;  Surgeon: Loreta Ave, MD;  Location: [facility];  Service: Orthopedics;  Laterality: Right;  . KNEE ARTHROSCOPY Right 12/17/2016   Procedure: KNEE ARTHROSCOPY, REMOVAL OF LOOSE BODIES;  Surgeon: Loreta Ave, MD;  Location: [facility];  Service: Orthopedics;  Laterality: Right;    There were no vitals filed for this visit.   Subjective Assessment - 01/29/20 1154    Subjective The patient has not had incidence of knees locking this week.  He has not done HEP regularly because of job demands.    Pertinent  History migraine, prediabetes    Patient Stated Goals Return to tennis    Currently in Pain? No/denies    Pain Score 0-No pain              [facility] PT Assessment - 01/29/20 1157      Assessment   Medical Diagnosis L chondromalacia patella    Referring Provider (PT) Rodney Langton, MD    Onset Date/Surgical Date 12/19/19    Hand Dominance Left                         [facility] Adult PT Treatment/Exercise - 01/29/20 1157      Exercises   Exercises Knee/Hip      Knee/Hip Exercises: Stretches   Active Hamstring Stretch Right;Left;1 rep;30 seconds    Quad Stretch Right;Left;1 rep;30 seconds    Hip Flexor Stretch Right;Left;1 rep;30 seconds    ITB Stretch Right;Left;1 rep;30 seconds      Knee/Hip Exercises: Aerobic   Elliptical 3 minutes comfortable pace level 1      Knee/Hip Exercises: Standing   Step Down Left;Right;5 reps    Step Down Limitations gets some mild lateral knee discomfort    SLS loading into diver x 10 reps reaching to chair    SLS with Vectors anterior, lateral, posterior in SLS    Other Standing Knee Exercises standing  high knee marching      Manual Therapy   Manual Therapy Soft tissue mobilization    Manual therapy comments skilled palpation to assess soft tissue response to dry needling and soft tissue mobilization    Soft tissue mobilization STM and self rolling wiht massage stick on L IT band, L quads, L lateral hamstring            Trigger Point Dry Needling - 01/29/20 1346    Consent Given? Yes    Education Handout Provided Previously provided    Muscles Treated Lower Quadrant Vastus lateralis;Hamstring    Vastus lateralis Response Twitch response elicited;Palpable increased muscle length    Hamstring Response Twitch response elicited;Palpable increased muscle length                PT Education - 01/29/20 1343    Education Details modified HEP/ hamstring stretch    Person(s) Educated Patient    Methods  Explanation;Demonstration;Handout    Comprehension Verbalized understanding;Returned demonstration               PT Long Term Goals - 01/08/20 0854      PT LONG TERM GOAL #1   Title The patient will be indep with HEP L knee.    Time 6    Period Weeks    Target Date 02/19/20      PT LONG TERM GOAL #2   Title The patient will reduce functional limitation per FOTO from 42% to < or equal to 32%.    Time 6    Period Weeks    Target Date 02/19/20      PT LONG TERM GOAL #3   Title The patient will reduce pain with stair negotiation to < or equal to 2/10.    Time 6    Period Weeks    Target Date 02/19/20      PT LONG TERM GOAL #4   Title The patient will return to doubles tennis match with pain < or equal to 2/10.    Time 6    Period Weeks    Target Date 02/19/20      PT LONG TERM GOAL #5   Title The patient will verbalize understanding of gym routine for continued LE strengthening to manage chronic pain.    Time 6    Period Weeks    Target Date 02/19/20                 Plan - 01/29/20 1344    Clinical Impression Statement The patient is not able to fit in HEP limiting his progress between sessions.  During PT, he gets dec'd tightness with DN and stretching + ther ex.  PT continuing to progress activities to tolerance and discussed ways to improve compliance with HEP.    PT Treatment/Interventions ADLs/Self Care Home Management;Gait training;Functional mobility training;Patient/family education;Therapeutic activities;Therapeutic exercise;Electrical Stimulation;Moist Heat;Iontophoresis 4mg /ml Dexamethasone;Manual techniques;Dry needling;Taping;Stair training;Neuromuscular re-education    PT Next Visit Plan STM and DN if needed, flexibility, strengthening, standing balance L LE; return to sport    PT Home Exercise Plan Access Code:    Consulted and Agree with Plan of Care Patient           Patient will benefit from skilled therapeutic intervention in order  to improve the following deficits and impairments:  Pain, Postural dysfunction, Impaired flexibility, Hypermobility, Decreased strength, Increased fascial restricitons, Decreased activity tolerance  Visit Diagnosis: Acute pain of left knee  Muscle weakness (generalized)  Other symptoms and  signs involving the musculoskeletal system     Problem List Patient Active Problem List   Diagnosis Date Noted  . Prediabetes 09/15/2017  . Low testosterone 09/15/2017  . Chondromalacia, patella, left 10/07/2016  . Seborrheic dermatitis 12/16/2015  . Headache, migraine 08/06/2015  . Exercise-induced asthma 08/06/2015  . Attention deficit hyperactivity disorder 07/02/2011  . HLD (hyperlipidemia) 02/20/2011  . Avitaminosis D 02/20/2011  . OSA (obstructive sleep apnea) 12/10/2010  . Adult hypothyroidism 10/29/2010    Masen Luallen, PT 01/29/2020, 1:47 PM  [facility] [address] [place], [place], [address] Phone: [phone]   Fax:  [phone]  Name: Burnell Hurta MRN: [id number] Date of Birth: 05-Mar-1965

## 2020-02-03 ENCOUNTER — Encounter (HOSPITAL_BASED_OUTPATIENT_CLINIC_OR_DEPARTMENT_OTHER): Payer: Self-pay | Admitting: Emergency Medicine

## 2020-02-03 ENCOUNTER — Encounter: Payer: Self-pay | Admitting: Emergency Medicine

## 2020-02-03 ENCOUNTER — Other Ambulatory Visit: Payer: Self-pay

## 2020-02-03 ENCOUNTER — Emergency Department (HOSPITAL_BASED_OUTPATIENT_CLINIC_OR_DEPARTMENT_OTHER)
Admission: EM | Admit: 2020-02-03 | Discharge: 2020-02-03 | Disposition: A | Discharge status: Home / Self Care | Payer: BC Managed Care – PPO | Attending: Emergency Medicine | Admitting: Emergency Medicine

## 2020-02-03 ENCOUNTER — Emergency Department (INDEPENDENT_AMBULATORY_CARE_PROVIDER_SITE_OTHER): Payer: BC Managed Care – PPO

## 2020-02-03 ENCOUNTER — Emergency Department (INDEPENDENT_AMBULATORY_CARE_PROVIDER_SITE_OTHER)
Admission: EM | Admit: 2020-02-03 | Discharge: 2020-02-03 | Disposition: A | Discharge status: Home / Self Care | Payer: BC Managed Care – PPO | Source: Home / Self Care

## 2020-02-03 DIAGNOSIS — E039 Hypothyroidism, unspecified: Secondary | ICD-10-CM | POA: Insufficient documentation

## 2020-02-03 DIAGNOSIS — Z23 Encounter for immunization: Secondary | ICD-10-CM

## 2020-02-03 DIAGNOSIS — S60450A Superficial foreign body of right index finger, initial encounter: Secondary | ICD-10-CM | POA: Diagnosis not present

## 2020-02-03 DIAGNOSIS — S6991XA Unspecified injury of right wrist, hand and finger(s), initial encounter: Secondary | ICD-10-CM

## 2020-02-03 DIAGNOSIS — S61240A Puncture wound with foreign body of right index finger without damage to nail, initial encounter: Secondary | ICD-10-CM | POA: Diagnosis not present

## 2020-02-03 DIAGNOSIS — J45990 Exercise induced bronchospasm: Secondary | ICD-10-CM | POA: Diagnosis not present

## 2020-02-03 DIAGNOSIS — M795 Residual foreign body in soft tissue: Secondary | ICD-10-CM | POA: Diagnosis not present

## 2020-02-03 DIAGNOSIS — W458XXA Other foreign body or object entering through skin, initial encounter: Secondary | ICD-10-CM | POA: Insufficient documentation

## 2020-02-03 MED ORDER — CEPHALEXIN 500 MG PO CAPS: 500.0000 mg | ORAL_CAPSULE | Freq: Four times a day (QID) | ORAL | 0 refills | Status: AC

## 2020-02-03 MED ORDER — DOXYCYCLINE HYCLATE 100 MG PO CAPS: 100.0000 mg | ORAL_CAPSULE | Freq: Two times a day (BID) | ORAL | 0 refills | Status: AC

## 2020-02-03 MED ORDER — LIDOCAINE HCL 2 % IJ SOLN
10.0000 mL | Freq: Once | INTRAMUSCULAR | Status: AC
  Administered 2020-02-03: 12:00:00 200 mg
  Filled 2020-02-03: qty 20

## 2020-02-03 MED ORDER — IBUPROFEN 600 MG PO TABS
600.0000 mg | ORAL_TABLET | Freq: Once | ORAL | Status: AC
  Administered 2020-02-03: 600 mg via ORAL

## 2020-02-03 MED ORDER — TETANUS-DIPHTH-ACELL PERTUSSIS 5-2.5-18.5 LF-MCG/0.5 IM SUSY
0.5000 mL | PREFILLED_SYRINGE | Freq: Once | INTRAMUSCULAR | Status: AC
  Administered 2020-02-03: 0.5 mL via INTRAMUSCULAR

## 2020-02-03 MED ORDER — HYDROCODONE-ACETAMINOPHEN 5-325 MG PO TABS
1.0000 | ORAL_TABLET | Freq: Four times a day (QID) | ORAL | 0 refills | Status: DC | PRN
Start: 2020-02-03 — End: 2020-04-22

## 2020-02-03 NOTE — ED Provider Notes (Signed)
[facility]    CSN: [id number] Arrival date & time: 02/03/20  [id number]      History   Chief Complaint Chief Complaint  Patient presents with  . Foreign Body    Right index     HPI Scott [name] a 54 y.o. male.   HPI  Scott [name] a 54 y.o. male presenting to UC with c/o having 2 fishhooks stuck on his Right index finger that occurred around 7:30AM.  Pt was pulling the boat cover off his boat when a triple fish hook struck his hand. Two hooks stuck on the back of his finger around the PIP joint. Pains is 2/10, worse with movement. Last tetanus over 10 years ago. Pt is ambidextrous but writes with his Left hand.    Past Medical History:  Diagnosis Date  . ADHD (attention deficit hyperactivity disorder)   . Allergy   . Appendicitis 1998  . Arthritis   . Asthma    Exercise Induced  . Fracture of wrist 1984  . Fracture, maxillary (HCC) 2009  . GERD (gastroesophageal reflux disease)   . Hyperlipidemia   . Low testosterone 09/15/2017  . PONV (postoperative nausea and vomiting)   . Prediabetes 09/15/2017  . Sleep apnea   . Staph infection 2000 and 2002   staph infections post surgeries appe and vasectomy  . Thyroid disease     Patient Active Problem List   Diagnosis Date Noted  . Prediabetes 09/15/2017  . Low testosterone 09/15/2017  . Chondromalacia, patella, left 10/07/2016  . Seborrheic dermatitis 12/16/2015  . Headache, migraine 08/06/2015  . Exercise-induced asthma 08/06/2015  . Attention deficit hyperactivity disorder 07/02/2011  . HLD (hyperlipidemia) 02/20/2011  . Avitaminosis D 02/20/2011  . OSA (obstructive sleep apnea) 12/10/2010  . Adult hypothyroidism 10/29/2010    Past Surgical History:  Procedure Laterality Date  . APPENDECTOMY    . CHONDROPLASTY Right 12/17/2016   Procedure: CHONDROPLASTY;  Surgeon: Loreta AveMurphy, Daniel F, MD;  Location: [facility];  Service: Orthopedics;  Laterality: Right;  . KNEE ARTHROSCOPY Right  12/17/2016   Procedure: KNEE ARTHROSCOPY, REMOVAL OF LOOSE BODIES;  Surgeon: Loreta AveMurphy, Daniel F, MD;  Location: [facility];  Service: Orthopedics;  Laterality: Right;       Home Medications    Prior to Admission medications   Medication Sig Start Date End Date Taking? Authorizing Provider  albuterol (PROAIR HFA) 108 (90 Base) MCG/ACT inhaler Inhale 2 puffs into the lungs every 6 (six) hours as needed for wheezing or shortness of breath. 05/26/16   Rodolph Bongorey, Evan S, MD  AMBULATORY NON FORMULARY MEDICATION Adjust continuous positive airway pressure (CPAP) machine to following settings  auto-titrate from 8-20 cm of H2O pressure. Send to [facility] [place].  Fax [phone] phone: [phone] 12/15/18   Rodolph Bongorey, Evan S, MD  cholecalciferol (VITAMIN D) 1000 units tablet Take 4,000 Units by mouth daily.    [provider]  dexmethylphenidate (FOCALIN XR) 10 MG 24 hr capsule Take 1 capsule (10 mg total) by mouth daily. 01/23/20   Everrett CoombeMatthews, Cody, DO  diazepam (VALIUM) 10 MG tablet TAKE 1 TABLET BY MOUTH AT BEDTIME AS NEEDED FOR ANXIETY 01/25/20   Everrett CoombeMatthews, Cody, DO  doxycycline (VIBRAMYCIN) 100 MG capsule Take 1 capsule (100 mg total) by mouth 2 (two) times daily for 7 days. 02/03/20 02/10/20  Lurene ShadowPhelps, Jamese Trauger O, PA-C  Erenumab-aooe (AIMOVIG) 140 MG/ML SOAJ Inject 140 mg into the skin every 30 (thirty) days. 04/27/19   Tollie EthEarly, Sara E, NP  HYDROcodone-acetaminophen (NORCO/VICODIN) 5-325 MG tablet Take 1-2 tablets by mouth every 6 (six) hours as needed. 02/03/20   Lurene Shadow, PA-C  MAGNESIUM CITRATE PO Take 250 mg by mouth.    [provider]  Melatonin 3 MG TABS Take 3 mg by mouth.    [provider]  Omega-3 Fatty Acids (FISH OIL) 1000 MG CAPS Take 1,000 mg by mouth.    [provider]  testosterone cypionate (DEPOTESTOSTERONE CYPIONATE) 200 MG/ML injection INJECT 0.4 MLS WEEKLY AS DIRECTED 05/20/18   [provider]  Thyroid (NATURE-THROID)  113.75 MG TABS Take 112 mcg by mouth.    [provider]    Family History Family History  Problem Relation Age of Onset  . Heart attack Father   . Emphysema Father   . Heart failure Father   . Alcohol abuse Maternal Aunt   . Alcohol abuse Maternal Uncle   . Diabetes Mother   . Hypertension Mother   . Colon cancer Neg Hx   . Esophageal cancer Neg Hx   . Rectal cancer Neg Hx   . Stomach cancer Neg Hx     Social History Social History   Tobacco Use  . Smoking status: Never Smoker  . Smokeless tobacco: Never Used  Vaping Use  . Vaping Use: Never used  Substance Use Topics  . Alcohol use: Yes    Alcohol/week: 1.0 standard drink    Types: 1 Glasses of wine per week    Comment: every several months  . Drug use: No     Allergies   Naproxen   Review of Systems Review of Systems  Musculoskeletal: Negative for arthralgias and joint swelling.  Skin: Positive for wound. Negative for color change.  Neurological: Negative for weakness and numbness.     Physical Exam Triage Vital Signs ED Triage Vitals  Enc Vitals Group     BP 02/03/20 0825 125/82     Pulse Rate 02/03/20 0825 89     Resp 02/03/20 0825 18     Temp 02/03/20 0825 98 F (36.7 C)     Temp src --      SpO2 02/03/20 0825 96 %     Weight 02/03/20 0827 230 lb (104.3 kg)     Height 02/03/20 0827 5\' 8"  (1.727 m)     Head Circumference --      Peak Flow --      Pain Score 02/03/20 0826 2     Pain Loc --      Pain Edu? --      Excl. in GC? --    No data found.  Updated Vital Signs BP 125/82 (BP Location: Left Arm)   Pulse 89   Temp 98 F (36.7 C)   Resp 18   Ht 5\' 8"  (1.727 m)   Wt 230 lb (104.3 kg)   SpO2 96%   BMI 34.97 kg/m   Visual Acuity Right Eye Distance:   Left Eye Distance:   Bilateral Distance:    Right Eye Near:   Left Eye Near:    Bilateral Near:     Physical Exam Vitals and nursing note reviewed.  Constitutional:      Appearance: Normal appearance. He is  well-developed and well-nourished.  HENT:     Head: Normocephalic and atraumatic.  Eyes:     Extraocular Movements: EOM normal.  Cardiovascular:     Rate and Rhythm: Normal rate.  Pulmonary:     Effort: Pulmonary effort is normal.  Musculoskeletal:        General: Tenderness present.     Cervical back: Normal range of motion.     Comments: Right index finger: full ROM DIP joint, limited at PIP due to pain and hesitancy with fish hooks in place. Mild tenderness around hooks.  Skin:    General: Skin is warm and dry.     Capillary Refill: Capillary refill takes less than 2 seconds.  Neurological:     Mental Status: He is alert and oriented to person, place, and time.     Sensory: No sensory deficit.  Psychiatric:        Mood and Affect: Mood and affect normal.        Behavior: Behavior normal.      UC Treatments / Results  Labs (all labs ordered are listed, but only abnormal results are displayed) Labs Reviewed - No data to display  EKG   Radiology DG Finger Index Right  Result Date: 02/03/2020 CLINICAL DATA:  Fishhooks finger EXAM: RIGHT INDEX FINGER 2+V COMPARISON:  None. FINDINGS: Two barbed fish in hooks in the soft tissue of the index finger. Hooks along the dorsal surface. No fracture IMPRESSION: Fishhooks in soft tissue of finger. Electronically Signed   By: Genevive Bi M.D.   On: 02/03/2020 09:12    Procedures Foreign Body Removal  Date/Time: 02/03/2020 10:05 AM Performed by: Lurene Shadow, PA-C Authorized by: Lurene Shadow, PA-C   Consent:    Consent obtained:  Verbal   Consent given by:  Patient   Risks, benefits, and alternatives were discussed: yes     Risks discussed:  Bleeding, infection, nerve damage, poor cosmetic result, pain, worsening of condition and incomplete removal   Alternatives discussed:  No treatment, alternative treatment and delayed treatment Location:    Location:  Finger   Finger location:  R index finger   Depth:   Intradermal   Tendon involvement:  Superficial Pre-procedure details:    Imaging:  X-ray   Neurovascular status: intact     Preparation: Patient was prepped and draped in usual sterile fashion   Anesthesia:    Anesthesia method:  Nerve block   Block needle gauge:  25 G   Block anesthetic:  Lidocaine 1% w/o epi   Block technique:  Digital   Block injection procedure:  Anatomic landmarks identified, incremental injection, negative aspiration for blood, introduced needle and anatomic landmarks palpated   Block outcome:  Anesthesia achieved Procedure type:    Procedure complexity:  Complex Procedure details:    Localization method:  Probed and visualized   Dissection of underlying tissues: no     Bloodless field: yes     Removal mechanism:  Forceps and hemostat   Guidance: serial x-rays     Foreign bodies recovered:  None   Intact foreign body removal: no   Post-procedure details:    Neurovascular status: deficits (anesthesia from digital block still present)     Skin closure:  None   Dressing:  Antibiotic ointment, bulky dressing and splint for protection   Procedure completion:  Procedure terminated electively by provider (terminated after mulitple failed attempts to remove fishhook )   (including critical care time)  Medications Ordered in UC Medications  Tdap (BOOSTRIX) injection 0.5 mL (0.5 mLs Intramuscular Given 02/03/20 0840)  ibuprofen (ADVIL) tablet 600 mg (600 mg Oral Given 02/03/20 1015)    Initial Impression / Assessment and Plan / UC Course  I have reviewed the triage vital signs and the  nursing notes.  Pertinent labs & imaging results that were available during my care of the patient were reviewed by me and considered in my medical decision making (see chart for details).    Finger blocked to Right index finger used to help with removal of fish hook.  After multiple failed attempts of pushing hook through or backing out, terminated for patient's comfort and to  limit worsening of condition.  Consulted with Dr. [name], who is out of town.  Discussed with Dr. Jordan Likes, recommend splint, antibiotics, f/u Monday with hand surgeon. Consulted Dr. Izora Ribas who states hooks should be removed today.  No other provider available at this facility to attempt removal.  Pt sent to [facility] for further evaluation and treatment.   Final Clinical Impressions(s) / UC Diagnoses   Final diagnoses:  Foreign body of right index finger     Discharge Instructions      Please go to [facility] emergency department for further evaluation and treatment of the fish hooks stuck in your finger.     ED Prescriptions    Medication Sig Dispense Auth. Provider   doxycycline (VIBRAMYCIN) 100 MG capsule Take 1 capsule (100 mg total) by mouth 2 (two) times daily for 7 days. 14 capsule Waylan Rocher O, [place]   HYDROcodone-acetaminophen (NORCO/VICODIN) 5-325 MG tablet Take 1-2 tablets by mouth every 6 (six) hours as needed. 12 tablet Lurene Shadow, [place]     I have reviewed the PDMP during this encounter.   Lurene Shadow, PA-C 02/03/20 1038

## 2020-02-03 NOTE — Discharge Instructions (Addendum)
Monitor for signs of infection.  Apply antibiotic ointment to the wound

## 2020-02-03 NOTE — ED Provider Notes (Signed)
[facility] EMERGENCY DEPARTMENT Provider Note   CSN: [id number] Arrival date & time: 02/03/20  1104     History Chief Complaint  Patient presents with  . Foreign Body in Skin    Scott [name] a 54 y.o. male.  HPI   Patient presented to the ED for evaluation of fishhooks stuck in his right index finger.  Patient was pulling the boat cover of his boat when a fishhook ended up getting stuck in his right index finger.  Patient states it was a triple hook.  He removed the barbs from the hook.  Patient complains of pain with movement.  He denies any numbness or weakness.  Patient went to an urgent care and he was given tetanus.  Patient's finger was blocked at the urgent care.  They tried to remove the fishhooks but were unsuccessful.  Patient was sent to the ED.  Past Medical History:  Diagnosis Date  . ADHD (attention deficit hyperactivity disorder)   . Allergy   . Appendicitis 1998  . Arthritis   . Asthma    Exercise Induced  . Fracture of wrist 1984  . Fracture, maxillary (HCC) 2009  . GERD (gastroesophageal reflux disease)   . Hyperlipidemia   . Low testosterone 09/15/2017  . PONV (postoperative nausea and vomiting)   . Prediabetes 09/15/2017  . Sleep apnea   . Staph infection 2000 and 2002   staph infections post surgeries appe and vasectomy  . Thyroid disease     Patient Active Problem List   Diagnosis Date Noted  . Prediabetes 09/15/2017  . Low testosterone 09/15/2017  . Chondromalacia, patella, left 10/07/2016  . Seborrheic dermatitis 12/16/2015  . Headache, migraine 08/06/2015  . Exercise-induced asthma 08/06/2015  . Attention deficit hyperactivity disorder 07/02/2011  . HLD (hyperlipidemia) 02/20/2011  . Avitaminosis D 02/20/2011  . OSA (obstructive sleep apnea) 12/10/2010  . Adult hypothyroidism 10/29/2010    Past Surgical History:  Procedure Laterality Date  . APPENDECTOMY    . CHONDROPLASTY Right 12/17/2016   Procedure: CHONDROPLASTY;   Surgeon: Loreta Ave, MD;  Location:  SURGERY CENTER;  Service: Orthopedics;  Laterality: Right;  . KNEE ARTHROSCOPY Right 12/17/2016   Procedure: KNEE ARTHROSCOPY, REMOVAL OF LOOSE BODIES;  Surgeon: Loreta Ave, MD;  Location:  SURGERY CENTER;  Service: Orthopedics;  Laterality: Right;       Family History  Problem Relation Age of Onset  . Heart attack Father   . Emphysema Father   . Heart failure Father   . Alcohol abuse Maternal Aunt   . Alcohol abuse Maternal Uncle   . Diabetes Mother   . Hypertension Mother   . Colon cancer Neg Hx   . Esophageal cancer Neg Hx   . Rectal cancer Neg Hx   . Stomach cancer Neg Hx     Social History   Tobacco Use  . Smoking status: Never Smoker  . Smokeless tobacco: Never Used  Vaping Use  . Vaping Use: Never used  Substance Use Topics  . Alcohol use: Not Currently    Alcohol/week: 1.0 standard drink    Types: 1 Glasses of wine per week    Comment: every several months  . Drug use: No    Home Medications Prior to Admission medications   Medication Sig Start Date End Date Taking? Authorizing Provider  albuterol (PROAIR HFA) 108 (90 Base) MCG/ACT inhaler Inhale 2 puffs into the lungs every 6 (six) hours as needed for wheezing or shortness of  breath. 05/26/16   Rodolph Bong, MD  AMBULATORY NON FORMULARY MEDICATION Adjust continuous positive airway pressure (CPAP) machine to following settings  auto-titrate from 8-20 cm of H2O pressure. Send to [facility] [place].  Fax [phone] phone: [phone] 12/15/18   Rodolph Bong, MD  cephALEXin (KEFLEX) 500 MG capsule Take 1 capsule (500 mg total) by mouth 4 (four) times daily for 5 days. 02/03/20 02/08/20  Linwood Dibbles, MD  cholecalciferol (VITAMIN D) 1000 units tablet Take 4,000 Units by mouth daily.    [provider]  dexmethylphenidate (FOCALIN XR) 10 MG 24 hr capsule Take 1 capsule (10 mg total) by mouth daily. 01/23/20   Everrett Coombe, DO   diazepam (VALIUM) 10 MG tablet TAKE 1 TABLET BY MOUTH AT BEDTIME AS NEEDED FOR ANXIETY 01/25/20   Everrett Coombe, DO  doxycycline (VIBRAMYCIN) 100 MG capsule Take 1 capsule (100 mg total) by mouth 2 (two) times daily for 7 days. 02/03/20 02/10/20  Lurene Shadow, PA-C  Erenumab-aooe (AIMOVIG) 140 MG/ML SOAJ Inject 140 mg into the skin every 30 (thirty) days. 04/27/19   Tollie Eth, NP  HYDROcodone-acetaminophen (NORCO/VICODIN) 5-325 MG tablet Take 1-2 tablets by mouth every 6 (six) hours as needed. 02/03/20   Lurene Shadow, PA-C  MAGNESIUM CITRATE PO Take 250 mg by mouth.    [provider]  Melatonin 3 MG TABS Take 3 mg by mouth.    [provider]  Omega-3 Fatty Acids (FISH OIL) 1000 MG CAPS Take 1,000 mg by mouth.    [provider]  testosterone cypionate (DEPOTESTOSTERONE CYPIONATE) 200 MG/ML injection INJECT 0.4 MLS WEEKLY AS DIRECTED 05/20/18   [provider]  Thyroid (NATURE-THROID) 113.75 MG TABS Take 112 mcg by mouth.    [provider]    Allergies    Naproxen  Review of Systems   Review of Systems  All other systems reviewed and are negative.   Physical Exam Updated Vital Signs BP 116/84 (BP Location: Left Arm)   Pulse 65   Temp 97.8 F (36.6 C) (Oral)   Resp 18   Ht 1.727 m (5\' 8" )   Wt 104.3 kg   SpO2 100%   BMI 34.97 kg/m   Physical Exam Vitals and nursing note reviewed.  Constitutional:      General: He is not in acute distress.    Appearance: He is well-developed.  HENT:     Head: Normocephalic and atraumatic.     Right Ear: External ear normal.     Left Ear: External ear normal.  Eyes:     General: No scleral icterus.       Right eye: No discharge.        Left eye: No discharge.     Conjunctiva/sclera: Conjunctivae normal.  Neck:     Trachea: No tracheal deviation.  Cardiovascular:     Rate and Rhythm: Normal rate.  Pulmonary:     Effort: Pulmonary effort is normal. No respiratory distress.     Breath  sounds: No stridor.  Abdominal:     General: There is no distension.  Musculoskeletal:        General: No swelling or deformity.     Cervical back: Neck supple.     Comments: Neurovascularly intact, 2 fishhooks embedded within the right index finger on the dorsal aspect  Skin:    General: Skin is warm and dry.     Findings: No rash.  Neurological:     Mental Status: He is  alert.     Cranial Nerves: Cranial nerve deficit: no gross deficits.     ED Results / Procedures / Treatments   Labs (all labs ordered are listed, but only abnormal results are displayed) Labs Reviewed - No data to display  EKG None  Radiology DG Finger Index Right  Result Date: 02/03/2020 CLINICAL DATA:  Fishhooks finger EXAM: RIGHT INDEX FINGER 2+V COMPARISON:  None. FINDINGS: Two barbed fish in hooks in the soft tissue of the index finger. Hooks along the dorsal surface. No fracture IMPRESSION: Fishhooks in soft tissue of finger. Electronically Signed   By: Genevive Bi M.D.   On: 02/03/2020 09:12    Procedures .Foreign Body Removal  Date/Time: 02/03/2020 12:32 PM Performed by: Alveria Apley, PA-C Authorized by: Linwood Dibbles, MD  Anesthesia: local infiltration  Anesthesia: Local Anesthetic: lidocaine 2% without epinephrine Anesthetic total: 2 mL  Sedation: Patient sedated: no  Patient restrained: no Complexity: simple 2 objects recovered. Objects recovered: Fishhooks Post-procedure assessment: foreign body removed Patient tolerance: patient tolerated the procedure well with no immediate complications Comments: Local infiltration performed by me.  PA Caccavale performed the fishhook removal.  Fishhooks were removed without difficulty.  Neurovascular intact following the procedure.  No bleeding   (including critical care time)  Medications Ordered in ED Medications  lidocaine (XYLOCAINE) 2 % (with pres) injection 200 mg (200 mg Infiltration Given by Other 02/03/20 1154)    ED  Course  I have reviewed the triage vital signs and the nursing notes.  Pertinent labs & imaging results that were available during my care of the patient were reviewed by me and considered in my medical decision making (see chart for details).    MDM Rules/Calculators/A&P                          Patient presented with foreign body in his finger.  Fishhooks were successfully removed.  Will discharge home on a course of prophylactic antibiotics.  Warning signs precautions discussed. Final Clinical Impression(s) / ED Diagnoses Final diagnoses:  Fish hook injury of finger, right, initial encounter    Rx / DC Orders ED Discharge Orders         Ordered    cephALEXin (KEFLEX) 500 MG capsule  4 times daily        02/03/20 1228           Linwood Dibbles, MD 02/03/20 1234

## 2020-02-03 NOTE — ED Triage Notes (Signed)
Pt arrives pov with driver with c/o fish hook in R distal index finger. Pt arrives from UC, received tetanus, and lidocaine, UC unable to remove hook

## 2020-02-03 NOTE — ED Notes (Signed)
Patient is being discharged from the Urgent Care and sent to the Emergency Department via *POV** . Per **PHELPS*, patient is in need of higher level of care due to [facility] HOOK IN LT INDEX FINGER, POSSIBLE TENDON INVOLVEMENT. Patient is aware and verbalizes understanding of plan of care.  Vitals:   02/03/20 0825  BP: 125/82  Pulse: 89  Resp: 18  Temp: 98 F (36.7 C)  SpO2: 96%

## 2020-02-03 NOTE — ED Triage Notes (Signed)
Patient here for fish hook in right index today; Tdap over 10 years ago. Has had covid vaccinations and has not had influenza vaccination.

## 2020-02-03 NOTE — Discharge Instructions (Signed)
  Please go to [facility] emergency department for further evaluation and treatment of the fish hooks stuck in your finger.

## 2020-02-05 ENCOUNTER — Ambulatory Visit (INDEPENDENT_AMBULATORY_CARE_PROVIDER_SITE_OTHER): Payer: BC Managed Care – PPO | Admitting: Rehabilitative and Restorative Service Providers"

## 2020-02-05 ENCOUNTER — Other Ambulatory Visit: Payer: Self-pay

## 2020-02-05 DIAGNOSIS — M6281 Muscle weakness (generalized): Secondary | ICD-10-CM | POA: Diagnosis not present

## 2020-02-05 DIAGNOSIS — R29898 Other symptoms and signs involving the musculoskeletal system: Secondary | ICD-10-CM

## 2020-02-05 DIAGNOSIS — M25562 Pain in left knee: Secondary | ICD-10-CM | POA: Diagnosis not present

## 2020-02-05 NOTE — Therapy (Signed)
[facility] [place] [address] [place], [place], [address] Phone: [phone]   Fax:  [phone]  Physical Therapy Treatment  Patient Details  Name: Scott Fields MRN: [id number] Date of Birth: 1965-10-15 Referring Provider (PT): Rodney Langton, MD   Encounter Date: 02/05/2020   PT End of Session - 02/05/20 1254    Visit Number 5    Number of Visits 12    Date for PT Re-Evaluation 02/19/20    Authorization Type [employer] $80 copay    PT Start Time 1152    PT Stop Time 1233    PT Time Calculation (min) 41 min    Activity Tolerance Patient tolerated treatment well    Behavior During Therapy [facility] for tasks assessed/performed           Past Medical History:  Diagnosis Date  . ADHD (attention deficit hyperactivity disorder)   . Allergy   . Appendicitis 1998  . Arthritis   . Asthma    Exercise Induced  . Fracture of wrist 1984  . Fracture, maxillary (HCC) 2009  . GERD (gastroesophageal reflux disease)   . Hyperlipidemia   . Low testosterone 09/15/2017  . PONV (postoperative nausea and vomiting)   . Prediabetes 09/15/2017  . Sleep apnea   . Staph infection 2000 and 2002   staph infections post surgeries appe and vasectomy  . Thyroid disease     Past Surgical History:  Procedure Laterality Date  . APPENDECTOMY    . CHONDROPLASTY Right 12/17/2016   Procedure: CHONDROPLASTY;  Surgeon: Loreta Ave, MD;  Location: [facility];  Service: Orthopedics;  Laterality: Right;  . KNEE ARTHROSCOPY Right 12/17/2016   Procedure: KNEE ARTHROSCOPY, REMOVAL OF LOOSE BODIES;  Surgeon: Loreta Ave, MD;  Location: [facility];  Service: Orthopedics;  Laterality: Right;    There were no vitals filed for this visit.   Subjective Assessment - 02/05/20 1154    Subjective The patient reports he stretched once a day this week and feels like he got only minimal gains.  Stairs are only painful  occasionally.    Pertinent History migraine, prediabetes    Patient Stated Goals Return to tennis    Currently in Pain? No/denies              [facility] PT Assessment - 02/05/20 1157      Assessment   Medical Diagnosis L chondromalacia patella    Referring Provider (PT) Rodney Langton, MD    Onset Date/Surgical Date 12/19/19    Hand Dominance Left                         [facility] Adult PT Treatment/Exercise - 02/05/20 1157      Ambulation/Gait   Gait Comments Jogging x 30 feet x 2 with minimal knee pain      Exercises   Exercises Knee/Hip      Knee/Hip Exercises: Stretches   Active Hamstring Stretch Right;Left;1 rep;30 seconds    Quad Stretch Right;Left;1 rep;30 seconds    Quad Stretch Limitations in standing with correction to technique/ recommended prone due to pain      Knee/Hip Exercises: Aerobic   Elliptical 3 minutes with 1.5 mph at self regulated pace      Knee/Hip Exercises: Plyometrics   Bilateral Jumping --   3 reps of jumping with pain at take off   Other Plyometric Exercises agility ladder beginning  with wide<>narrow R and L leading with  fast feet; then performed anterior/posterior stepping through ladder    Other Plyometric Exercises wide knee fast feet in mini squat position      Knee/Hip Exercises: Standing   Forward Lunges Left;10 reps    Forward Lunges Limitations 6" step up and down tapping to 12" surface and back to floor with R LE no UE support    Side Lunges Right;Left   3 reps   Side Lunges Limitations painful with eccentric lowering in L quads    Abduction Limitations sidestepping shuffle without knee pain x 10 feet R and L sides    Forward Step Up Left;10 reps    Forward Step Up Limitations step tapping to 12" surface and then to floor with R LE    Step Down Right    Step Down Limitations with step ups    SLS [place]iver reaching beyond chair height with L LE stability    Other Standing Knee Exercises standing BOSU with mini  squats and lateral weight shifting x 10 each without pain or discomfort.      Manual Therapy   Manual Therapy Soft tissue mobilization    Manual therapy comments medial hamstrings to reduce pain/tightness; skilled palpation and monitoring of tissues during dry needling    Soft tissue mobilization STM medial hamstrings            Trigger Point Dry Needling - 02/05/20 1220    Consent Given? Yes    Education Handout Provided Previously provided    Muscles Treated Lower Quadrant Hamstring    Hamstring Response Palpable increased muscle length                     PT Long Term Goals - 01/08/20 0854      PT LONG TERM GOAL #1   Title The patient will be indep with HEP L knee.    Time 6    Period Weeks    Target Date 02/19/20      PT LONG TERM GOAL #2   Title The patient will reduce functional limitation per FOTO from 42% to < or equal to 32%.    Time 6    Period Weeks    Target Date 02/19/20      PT LONG TERM GOAL #3   Title The patient will reduce pain with stair negotiation to < or equal to 2/10.    Time 6    Period Weeks    Target Date 02/19/20      PT LONG TERM GOAL #4   Title The patient will return to doubles tennis match with pain < or equal to 2/10.    Time 6    Period Weeks    Target Date 02/19/20      PT LONG TERM GOAL #5   Title The patient will verbalize understanding of gym routine for continued LE strengthening to manage chronic pain.    Time 6    Period Weeks    Target Date 02/19/20                 Plan - 02/05/20 1304    Clinical Impression Statement The patient is working towards LTGs noting only occasional pain descending steps.  In therapy, patient is tolerating increased loading without pain.  Movements that exacerbate pain include side lung (decelerating during eccentric lowering), bilateral jumping in place during initial concentric contraction.  The patient continues with significant muscle tightness and is using STM, DN in  therapy and also  massage therapy to manage.  PT to continue working to LTGs with emphasis on return to sport of tennis.    PT Treatment/Interventions ADLs/Self Care Home Management;Gait training;Functional mobility training;Patient/family education;Therapeutic activities;Therapeutic exercise;Electrical Stimulation;Moist Heat;Iontophoresis 4mg /ml Dexamethasone;Manual techniques;Dry needling;Taping;Stair training;Neuromuscular re-education    PT Next Visit Plan STM and DN if needed, flexibility, strengthening, standing balance L LE; return to tennis (dynamic/ lateral knee control)    PT Home Exercise Plan Access Code:    Consulted and Agree with Plan of Care Patient           Patient will benefit from skilled therapeutic intervention in order to improve the following deficits and impairments:  Pain,Postural dysfunction,Impaired flexibility,Hypermobility,Decreased strength,Increased fascial restricitons,Decreased activity tolerance  Visit Diagnosis: Acute pain of left knee  Muscle weakness (generalized)  Other symptoms and signs involving the musculoskeletal system     Problem List Patient Active Problem List   Diagnosis Date Noted  . Prediabetes 09/15/2017  . Low testosterone 09/15/2017  . Chondromalacia, patella, left 10/07/2016  . Seborrheic dermatitis 12/16/2015  . Headache, migraine 08/06/2015  . Exercise-induced asthma 08/06/2015  . Attention deficit hyperactivity disorder 07/02/2011  . HLD (hyperlipidemia) 02/20/2011  . Avitaminosis D 02/20/2011  . OSA (obstructive sleep apnea) 12/10/2010  . Adult hypothyroidism 10/29/2010    Sonya Gunnoe, PT 02/05/2020, 1:08 PM  [facility] [address] [place], [place], [place] Phone: [phone]   Fax:  [phone]  Name: Scott Fields MRN: Ellard Artis Date of Birth: 05-Jul-1965

## 2020-02-06 NOTE — Telephone Encounter (Signed)
No other preferred agent such as Euflexxa or Synvisc?

## 2020-02-06 NOTE — Telephone Encounter (Signed)
Orthovisc is not covered under medical or pharmacy plan they are seeing if patient qualifies for [employer] direct Access Program. - CF

## 2020-02-12 ENCOUNTER — Ambulatory Visit (INDEPENDENT_AMBULATORY_CARE_PROVIDER_SITE_OTHER): Payer: BC Managed Care – PPO | Admitting: Physical Therapy

## 2020-02-12 ENCOUNTER — Encounter: Payer: Self-pay | Admitting: Physical Therapy

## 2020-02-12 ENCOUNTER — Other Ambulatory Visit: Payer: Self-pay

## 2020-02-12 DIAGNOSIS — M6281 Muscle weakness (generalized): Secondary | ICD-10-CM

## 2020-02-12 DIAGNOSIS — M25562 Pain in left knee: Secondary | ICD-10-CM | POA: Diagnosis not present

## 2020-02-12 DIAGNOSIS — R29898 Other symptoms and signs involving the musculoskeletal system: Secondary | ICD-10-CM | POA: Diagnosis not present

## 2020-02-12 NOTE — Therapy (Addendum)
[facility] [place], [place], [address] Phone: [phone]   Fax:  [phone]  Physical Therapy Treatment/Discharge  Patient Details  Name: Scott Fields MRN: [id number] Date of Birth: May 29, 1965 Referring Provider (PT): Aundria Mems, MD   Encounter Date: 02/12/2020   PT End of Session - 02/12/20 1148    Visit Number 6    Number of Visits 12    Date for PT Re-Evaluation 02/19/20    Authorization Type BC[employer] $[id number] copay    PT Start Time 1149    PT Stop Time 1221    PT Time Calculation (min) 32 min    Activity Tolerance Patient tolerated treatment well    Behavior During Therapy [facility] for tasks assessed/performed           Past Medical History:  Diagnosis Date  . ADHD (attention deficit hyperactivity disorder)   . Allergy   . Appendicitis 1998  . Arthritis   . Asthma    Exercise Induced  . Fracture of wrist 1984  . Fracture, maxillary ([place]) 2009  . GERD (gastroesophageal reflux disease)   . Hyperlipidemia   . Low testosterone 09/15/2017  . PONV (postoperative nausea and vomiting)   . Prediabetes 09/15/2017  . Sleep apnea   . Staph infection 2000 and 2002   staph infections post surgeries appe and vasectomy  . Thyroid disease     Past Surgical History:  Procedure Laterality Date  . APPENDECTOMY    . CHONDROPLASTY Right 12/17/2016   Procedure: CHONDROPLASTY;  Surgeon: Ninetta Lights, MD;  Location: [facility];  Service: Orthopedics;  Laterality: Right;  . KNEE ARTHROSCOPY Right 12/17/2016   Procedure: KNEE ARTHROSCOPY, REMOVAL OF LOOSE BODIES;  Surgeon: Ninetta Lights, MD;  Location: [facility];  Service: Orthopedics;  Laterality: Right;    There were no vitals filed for this visit.   Subjective Assessment - 02/12/20 1149    Subjective Pt reports his knee is about the same, able to walk up/down stairs ok, however when he tries to do any thing with impact  he has pain - straight line running is usually ok, its quick stops and turns cause significant pain.  Did have a massage over the weekend and it loosened the HS up.    Patient Stated Goals Return to tennis    Currently in Pain? No/denies                             [facility] Adult PT Treatment/Exercise - 02/12/20 0001      Exercises   Exercises Knee/Hip      Knee/Hip Exercises: Stretches   Active Hamstring Stretch Both;5 reps    Active Hamstring Stretch Limitations standing    Musician Limitations standing butt kicks    Other Knee/Hip Stretches hip rotation stretches      Knee/Hip Exercises: Aerobic   Elliptical 5 minutes with 1.5 mph at self regulated pace      Knee/Hip Exercises: Standing   SLS The Diver 2x10 each side VS for form    Other Standing Knee Exercises 2x10 single leg sit to stand, each side using high surface      Knee/Hip Exercises: Supine   Bridges Strengthening;2 sets;10 reps    Bridges Limitations with 20# on hips      Knee/Hip Exercises: Sidelying   Other Sidelying Knee/Hip Exercises 10 reps each pilates  FWD/BWD kicks, CW/CCW circles, BWD/FWD toe taps                       PT Long Term Goals - 01/08/20 0854      PT LONG TERM GOAL #1   Title The patient will be indep with HEP L knee.    Time 6    Period Weeks    Target Date 02/19/20      PT LONG TERM GOAL #2   Title The patient will reduce functional limitation per FOTO from 42% to < or equal to 32%.    Time 6    Period Weeks    Target Date 02/19/20      PT LONG TERM GOAL #3   Title The patient will reduce pain with stair negotiation to < or equal to 2/10.    Time 6    Period Weeks    Target Date 02/19/20      PT LONG TERM GOAL #4   Title The patient will return to doubles tennis match with pain < or equal to 2/10.    Time 6    Period Weeks    Target Date 02/19/20      PT LONG TERM GOAL #5   Title The patient will verbalize  understanding of gym routine for continued LE strengthening to manage chronic pain.    Time 6    Period Weeks    Target Date 02/19/20                 Plan - 02/12/20 1211    Clinical Impression Statement Scott Fields reports improvement with stairs, still unable to play tennis without significant pain.  The pain is mainly with quick stops and lateral turns/push off.  Diver exercise reviewed and VC for form given as well as added single leg sit to stand from higher surface to strengthen leg in painfree ROM and single leg activity.  He is very tight in ITB/lateral thigh, added stretch for this as well.  He reports that next week will most likely be his last visit as his schedule is getting very busy.    Rehab Potential Good    PT Frequency 2x / week    PT Duration 6 weeks    PT Treatment/Interventions ADLs/Self Care Home Management;Gait training;Functional mobility training;Patient/family education;Therapeutic activities;Therapeutic exercise;Electrical Stimulation;Moist Heat;Iontophoresis 4mg/ml Dexamethasone;Manual techniques;Dry needling;Taping;Stair training;Neuromuscular re-education    PT Next Visit Plan ensure good HEP and HEP progression as he may be discharged.    PT Home Exercise Plan Access Code: [email or web address]  URL: [email or web address]  Date: 02/12/2020  Prepared by: Susan Shaver    Exercises  Prone Quadriceps Stretch with Strap - 2 x daily - 7 x weekly - 1 sets - 3 reps - 30 seconds hold  Seated Hamstring Stretch with Chair - 2 x daily - 7 x weekly - 1 sets - 3 reps - 30 seconds hold  Standing Single Leg Heel Raise - 2 x daily - 7 x weekly - 1 sets - 10-20 reps  Wall Squat - 2 x daily - 7 x weekly - 1 sets - 10-15 reps  Mini Lunge - 2 x daily - 7 x weekly - 1 sets - 5-10 reps  Supine ITB Stretch - 2 x daily - 7 x weekly - 1 sets - 3 reps - 20 seconds hold  IT Band Mobilization with Small Ball - 2 x daily - 7 x weekly - 1 sets - 1 reps -   2 minutes hold  Standing Shoulder and Trunk  Flexion at Table - 2 x daily - 7 x weekly - 1 sets - 3 reps - 30 seconds hold  Supine ITB Stretch with Strap - 1-2 x daily - 3-5 reps - 30 hold    Consulted and Agree with Plan of Care Patient           Patient will benefit from skilled therapeutic intervention in order to improve the following deficits and impairments:  Pain,Postural dysfunction,Impaired flexibility,Hypermobility,Decreased strength,Increased fascial restricitons,Decreased activity tolerance  Visit Diagnosis: Acute pain of left knee  Muscle weakness (generalized)  Other symptoms and signs involving the musculoskeletal system     Problem List Patient Active Problem List   Diagnosis Date Noted  . Prediabetes 09/15/2017  . Low testosterone 09/15/2017  . Chondromalacia, patella, left 10/07/2016  . Seborrheic dermatitis 12/16/2015  . Headache, migraine 08/06/2015  . Exercise-induced asthma 08/06/2015  . Attention deficit hyperactivity disorder 07/02/2011  . HLD (hyperlipidemia) 02/20/2011  . Avitaminosis D 02/20/2011  . OSA (obstructive sleep apnea) 12/10/2010  . Adult hypothyroidism 10/29/2010    Jeral Pinch PT  02/12/2020, 12:34 PM  [facility] [place], [place], [address] Phone: [phone]   Fax:  [phone]  Name: Scott Fields MRN: [id number] Date of Birth: [birth year]  PHYSICAL THERAPY DISCHARGE SUMMARY  Visits from Start of Care: 6  Current functional level related to goals / functional outcomes: unknown   Remaining deficits: unknown   Education / Equipment: HEP  Plan:                                                    Patient goals were not met. Patient is being discharged due to not returning since the last visit.  ?????    Jeral Pinch, PT 03/27/20 1:54 PM

## 2020-02-14 NOTE — Telephone Encounter (Signed)
Dr T  It just stated that the product was not covered under the medical or pharmacy plan and that patient may qualify for [employer] Direct Access program. - CF

## 2020-02-14 NOTE — Telephone Encounter (Signed)
Lets go for it, anything to get any brand approved.

## 2020-02-19 ENCOUNTER — Encounter: Payer: BC Managed Care – PPO | Admitting: Physical Therapy

## 2020-02-28 ENCOUNTER — Encounter: Payer: Self-pay | Admitting: Family Medicine

## 2020-02-28 ENCOUNTER — Other Ambulatory Visit: Payer: Self-pay | Admitting: Family Medicine

## 2020-02-28 DIAGNOSIS — E039 Hypothyroidism, unspecified: Secondary | ICD-10-CM | POA: Diagnosis not present

## 2020-02-28 DIAGNOSIS — F32A Depression, unspecified: Secondary | ICD-10-CM | POA: Diagnosis not present

## 2020-02-28 DIAGNOSIS — R7989 Other specified abnormal findings of blood chemistry: Secondary | ICD-10-CM | POA: Diagnosis not present

## 2020-02-28 DIAGNOSIS — F909 Attention-deficit hyperactivity disorder, unspecified type: Secondary | ICD-10-CM | POA: Diagnosis not present

## 2020-02-28 MED ORDER — DEXMETHYLPHENIDATE HCL ER 15 MG PO CP24: 15.0000 mg | ORAL_CAPSULE | Freq: Every day | ORAL | 0 refills | Status: DC

## 2020-03-08 DIAGNOSIS — F4323 Adjustment disorder with mixed anxiety and depressed mood: Secondary | ICD-10-CM | POA: Diagnosis not present

## 2020-03-08 DIAGNOSIS — F902 Attention-deficit hyperactivity disorder, combined type: Secondary | ICD-10-CM | POA: Diagnosis not present

## 2020-03-21 DIAGNOSIS — F4323 Adjustment disorder with mixed anxiety and depressed mood: Secondary | ICD-10-CM | POA: Diagnosis not present

## 2020-03-21 DIAGNOSIS — F902 Attention-deficit hyperactivity disorder, combined type: Secondary | ICD-10-CM | POA: Diagnosis not present

## 2020-03-25 ENCOUNTER — Encounter: Payer: Self-pay | Admitting: Family Medicine

## 2020-03-25 ENCOUNTER — Telehealth (INDEPENDENT_AMBULATORY_CARE_PROVIDER_SITE_OTHER): Payer: BC Managed Care – PPO | Admitting: Family Medicine

## 2020-03-25 DIAGNOSIS — J0100 Acute maxillary sinusitis, unspecified: Secondary | ICD-10-CM | POA: Diagnosis not present

## 2020-03-25 DIAGNOSIS — J329 Chronic sinusitis, unspecified: Secondary | ICD-10-CM | POA: Insufficient documentation

## 2020-03-25 MED ORDER — AMOXICILLIN-POT CLAVULANATE 875-125 MG PO TABS: 1.0000 | ORAL_TABLET | Freq: Two times a day (BID) | ORAL | 0 refills | Status: DC

## 2020-03-25 NOTE — Progress Notes (Signed)
Started 03/16/2020  Started with migraine above the eyes. Worse at night & in the morning. Controlled with Advil & Claritin D.  Pain behind both ears.  Felt feverish. No chills.

## 2020-03-25 NOTE — Assessment & Plan Note (Signed)
He has history of maxillay sinusitis and this feels similar.  Good response to antibiotics in the past.  He has had a little cough and felt feverish last night.  Recommend that he have COVID testing, he will use home antigen kit initially.  I will go ahead and start him on augmentin x10 days.  He will let me know if test is positive or symptoms are not improving with antibiotics.  He will isolate as a precaution for now.

## 2020-03-25 NOTE — Progress Notes (Addendum)
Scott Fields - 55 y.o. male MRN [id number]  Date of birth: 10/07/1965   This visit type was conducted due to national recommendations for restrictions regarding the COVID-19 Pandemic (e.g. social distancing).  This format is felt to be most appropriate for this patient at this time.  All issues noted in this document were discussed and addressed.  No physical exam was performed (except for noted visual exam findings with Video Visits).  I discussed the limitations of evaluation and management by telemedicine and the availability of in person appointments. The patient expressed understanding and agreed to proceed.  I connected with@ on 03/25/20 at  3:00 PM EST by a video enabled telemedicine application and verified that I am speaking with the correct person using two identifiers.  Interactive audio and video telecommunications were attempted between this provider and patient, however failed, due to patient having technical difficulties OR patient did not have access to video capability.  We continued and completed visit with audio only.    Present at visit: Luetta Nutting, DO Aron Baba   Patient Location: Home [place] [address]   Provider location:   [facility]  Chief Complaint  Patient presents with  . sinus congestion    HPI  Scott [name] a 55 y.o. male who presents via audio/video conferencing for a telehealth visit today.  He has complaint of headache with sinus congestion and pain with ear pain.  Feels similar to previous sinus infections he has had.  He also has started having cough this morning and felt feverish last night.  He denies shortness of breath, nausea, vomiting, diarrhea, body aches.  He has never had COVID that he is aware of.  He had J&J vaccine initially and booster last month.     ROS:  A comprehensive ROS was completed and negative except as noted per HPI  Past Medical History:  Diagnosis Date  . ADHD (attention deficit  hyperactivity disorder)   . Allergy   . Appendicitis 1998  . Arthritis   . Asthma    Exercise Induced  . Fracture of wrist 1984  . Fracture, maxillary ([place]) 2009  . GERD (gastroesophageal reflux disease)   . Hyperlipidemia   . Low testosterone 09/15/2017  . PONV (postoperative nausea and vomiting)   . Prediabetes 09/15/2017  . Sleep apnea   . Staph infection 2000 and 2002   staph infections post surgeries appe and vasectomy  . Thyroid disease     Past Surgical History:  Procedure Laterality Date  . APPENDECTOMY    . CHONDROPLASTY Right 12/17/2016   Procedure: CHONDROPLASTY;  Surgeon: Ninetta Lights, MD;  Location: [facility];  Service: Orthopedics;  Laterality: Right;  . KNEE ARTHROSCOPY Right 12/17/2016   Procedure: KNEE ARTHROSCOPY, REMOVAL OF LOOSE BODIES;  Surgeon: Ninetta Lights, MD;  Location: [facility];  Service: Orthopedics;  Laterality: Right;    Family History  Problem Relation Age of Onset  . Heart attack Father   . Emphysema Father   . Heart failure Father   . Alcohol abuse Maternal Aunt   . Alcohol abuse Maternal Uncle   . Diabetes Mother   . Hypertension Mother   . Colon cancer Neg Hx   . Esophageal cancer Neg Hx   . Rectal cancer Neg Hx   . Stomach cancer Neg Hx     Social History   Socioeconomic History  . Marital status: Married    Spouse name: Not on file  .  Number of children: Not on file  . Years of education: Not on file  . Highest education level: Not on file  Occupational History  . Not on file  Tobacco Use  . Smoking status: Never Smoker  . Smokeless tobacco: Never Used  Vaping Use  . Vaping Use: Never used  Substance and Sexual Activity  . Alcohol use: Not Currently    Alcohol/week: 1.0 standard drink    Types: 1 Glasses of wine per week    Comment: every several months  . Drug use: No  . Sexual activity: Yes    Partners: Female  Other Topics Concern  . Not on file  Social History Narrative   . Not on file   Social Determinants of Health   Financial Resource Strain: Not on file  Food Insecurity: Not on file  Transportation Needs: Not on file  Physical Activity: Not on file  Stress: Not on file  Social Connections: Not on file  Intimate Partner Violence: Not on file     Current Outpatient Medications:  .  albuterol (PROAIR HFA) 108 (90 Base) MCG/ACT inhaler, Inhale 2 puffs into the lungs every 6 (six) hours as needed for wheezing or shortness of breath., Disp: 8.5 Inhaler, Rfl: 11 .  AMBULATORY NON FORMULARY MEDICATION, Adjust continuous positive airway pressure (CPAP) machine to following settings  auto-titrate from 8-20 cm of H2O pressure. Send to [facility] [place].  Fax [phone] phone: [phone], Disp: 1 each, Rfl: 0 .  amoxicillin-clavulanate (AUGMENTIN) 875-125 MG tablet, Take 1 tablet by mouth 2 (two) times daily., Disp: 20 tablet, Rfl: 0 .  cholecalciferol (VITAMIN D) 1000 units tablet, Take 4,000 Units by mouth daily., Disp: , Rfl:  .  dexmethylphenidate (FOCALIN XR) 15 MG 24 hr capsule, Take 1 capsule (15 mg total) by mouth daily., Disp: 30 capsule, Rfl: 0 .  diazepam (VALIUM) 10 MG tablet, TAKE 1 TABLET BY MOUTH AT BEDTIME AS NEEDED FOR ANXIETY, Disp: 30 tablet, Rfl: 0 .  Erenumab-aooe (AIMOVIG) 140 MG/ML SOAJ, Inject 140 mg into the skin every 30 (thirty) days., Disp: 1 pen, Rfl: 12 .  HYDROcodone-acetaminophen (NORCO/VICODIN) 5-325 MG tablet, Take 1-2 tablets by mouth every 6 (six) hours as needed., Disp: 12 tablet, Rfl: 0 .  MAGNESIUM CITRATE PO, Take 250 mg by mouth., Disp: , Rfl:  .  Melatonin 3 MG TABS, Take 3 mg by mouth., Disp: , Rfl:  .  Omega-3 Fatty Acids (FISH OIL) 1000 MG CAPS, Take 1,000 mg by mouth., Disp: , Rfl:  .  testosterone cypionate (DEPOTESTOSTERONE CYPIONATE) 200 MG/ML injection, INJECT 0.4 MLS WEEKLY AS DIRECTED, Disp: , Rfl:  .  Thyroid 113.75 MG TABS, Take 112 mcg by mouth., Disp: , Rfl:   EXAM:  VITALS per patient if  applicable: Pulse 80   Wt 235 lb (106.6 kg)   BMI 35.73 kg/m   GENERAL: alert, oriented,  in no acute distress  LUNGS: no signs of respiratory distress,no obvious gross SOB, gasping or wheezing   PSYCH/NEURO: pleasant and cooperative, no obvious depression or anxiety, speech and thought processing grossly intact  ASSESSMENT AND PLAN:  Discussed the following assessment and plan:  Sinusitis He has history of maxillay sinusitis and this feels similar.  Good response to antibiotics in the past.  He has had a little cough and felt feverish last night.  Recommend that he have COVID testing, he will use home antigen kit initially.  I will go ahead and start him on augmentin x10 days.  He  will let me know if test is positive or symptoms are not improving with antibiotics.  He will isolate as a precaution for now.       I discussed the assessment and treatment plan with the patient. The patient was provided an opportunity to ask questions and all were answered. The patient agreed with the plan and demonstrated an understanding of the instructions.   The patient was advised to call back or seek an in-person evaluation if the symptoms worsen or if the condition fails to improve as anticipated.  30 minutes spent including pre visit preparation, review of prior notes and labs, encounter with patient via video visit and same day documentation.   Luetta Nutting, DO

## 2020-04-05 DIAGNOSIS — F902 Attention-deficit hyperactivity disorder, combined type: Secondary | ICD-10-CM | POA: Diagnosis not present

## 2020-04-05 DIAGNOSIS — F4323 Adjustment disorder with mixed anxiety and depressed mood: Secondary | ICD-10-CM | POA: Diagnosis not present

## 2020-04-09 ENCOUNTER — Encounter: Payer: Self-pay | Admitting: Family Medicine

## 2020-04-12 ENCOUNTER — Other Ambulatory Visit: Payer: Self-pay

## 2020-04-15 MED ORDER — DEXMETHYLPHENIDATE HCL ER 15 MG PO CP24
15.0000 mg | ORAL_CAPSULE | Freq: Every day | ORAL | 0 refills | Status: DC
Start: 2020-04-15 — End: 2020-05-27

## 2020-04-22 ENCOUNTER — Encounter: Payer: Self-pay | Admitting: Family Medicine

## 2020-04-22 ENCOUNTER — Other Ambulatory Visit: Payer: Self-pay

## 2020-04-22 ENCOUNTER — Ambulatory Visit (INDEPENDENT_AMBULATORY_CARE_PROVIDER_SITE_OTHER): Payer: BC Managed Care – PPO | Admitting: Family Medicine

## 2020-04-22 DIAGNOSIS — F902 Attention-deficit hyperactivity disorder, combined type: Secondary | ICD-10-CM | POA: Diagnosis not present

## 2020-04-22 DIAGNOSIS — E039 Hypothyroidism, unspecified: Secondary | ICD-10-CM | POA: Diagnosis not present

## 2020-04-22 DIAGNOSIS — G43019 Migraine without aura, intractable, without status migrainosus: Secondary | ICD-10-CM | POA: Diagnosis not present

## 2020-04-22 DIAGNOSIS — R7989 Other specified abnormal findings of blood chemistry: Secondary | ICD-10-CM

## 2020-04-22 DIAGNOSIS — R7303 Prediabetes: Secondary | ICD-10-CM

## 2020-04-22 DIAGNOSIS — F411 Generalized anxiety disorder: Secondary | ICD-10-CM | POA: Insufficient documentation

## 2020-04-22 MED ORDER — DIAZEPAM 10 MG PO TABS: ORAL_TABLET | ORAL | 1 refills | Status: DC

## 2020-04-22 NOTE — Patient Instructions (Addendum)
Great to see you today! See me again in about 3 months.

## 2020-04-22 NOTE — Assessment & Plan Note (Signed)
Continues to do well with aimovig, continue.

## 2020-04-22 NOTE — Assessment & Plan Note (Signed)
Diazepam renewed. He will continued to safeguard this from his wife.

## 2020-04-22 NOTE — Assessment & Plan Note (Signed)
This is managed by [facility].  On 2.5grains of NP thyroid

## 2020-04-22 NOTE — Assessment & Plan Note (Signed)
A1c elevated at 7.1% at last appt at [facility].  He is working on dietary and lifestyle change to improve this.

## 2020-04-22 NOTE — Progress Notes (Signed)
Pt states A!C: 7.1

## 2020-04-22 NOTE — Assessment & Plan Note (Signed)
Managed by [facility]

## 2020-04-22 NOTE — Progress Notes (Signed)
Scott Fields - 55 y.o. male MRN [id number]  Date of birth: 26-Sep-1965  Subjective Chief Complaint  Patient presents with  . Stress  . ADHD    HPI Scott [name] a 55 y.o. male here today for follow up of ADHD.  Reports that he has been under more stress recently.  Wife with suicidal ideations a couple of weeks ago.  She has had some issues with alcohol and valium and had been taking his as well.  He has since hidden this from her.  She is getting help and is seeing a psychiatrist which is helping with some of his worry.  He typically only takes diazepam once per week.  He feels that current dose of focalin is working well for him.  He denies side effects from medication including insomnia, palpitations, or increased anxiety.   He is seeing [facility] for management of NP Thyroid and testosterone.  He does report that he had an a1c a few months ago that was 7.1%.  Depression screen [facility] 2/9 04/22/2020 01/23/2020 03/07/2019  Decreased Interest 1 1 0  Down, Depressed, Hopeless 1 1 1   PHQ - 2 Score 2 2 1   Altered sleeping 1 - 1  Tired, decreased energy 1 - 1  Change in appetite 2 - 0  Feeling bad or failure about yourself  1 - 0  Trouble concentrating 1 - 1  Moving slowly or fidgety/restless 0 - 0  Suicidal thoughts 0 - 0  PHQ-9 Score 8 - 4  Difficult doing work/chores Somewhat difficult - Somewhat difficult   GAD 7 : Generalized Anxiety Score 04/22/2020 03/07/2019 02/08/2019  Nervous, Anxious, on Edge 1 1 1   Control/stop worrying 1 0 0  Worry too much - different things 1 0 0  Trouble relaxing 2 1 1   Restless 1 0 0  Easily annoyed or irritable 1 0 1  Afraid - awful might happen 1 0 0  Total GAD 7 Score 8 2 3   Anxiety Difficulty Somewhat difficult Somewhat difficult Not difficult at all      ROS:  A comprehensive ROS was completed and negative except as noted per HPI  Allergies  Allergen Reactions  . Naproxen Nausea And Vomiting    Past Medical History:   Diagnosis Date  . ADHD (attention deficit hyperactivity disorder)   . Allergy   . Appendicitis 1998  . Arthritis   . Asthma    Exercise Induced  . Fracture of wrist 1984  . Fracture, maxillary ([facility]) 2009  . GERD (gastroesophageal reflux disease)   . Hyperlipidemia   . Low testosterone 09/15/2017  . PONV (postoperative nausea and vomiting)   . Prediabetes 09/15/2017  . Sleep apnea   . Staph infection 2000 and 2002   staph infections post surgeries appe and vasectomy  . Thyroid disease     Past Surgical History:  Procedure Laterality Date  . APPENDECTOMY    . CHONDROPLASTY Right 12/17/2016   Procedure: CHONDROPLASTY;  Surgeon: 09/17/2017, MD;  Location: [facility];  Service: Orthopedics;  Laterality: Right;  . KNEE ARTHROSCOPY Right 12/17/2016   Procedure: KNEE ARTHROSCOPY, REMOVAL OF LOOSE BODIES;  Surgeon: 2001, MD;  Location: [facility];  Service: Orthopedics;  Laterality: Right;    Social History   Socioeconomic History  . Marital status: Married    Spouse name: Not on file  . Number of children: Not on file  . Years of education: Not on file  . Highest  education level: Not on file  Occupational History  . Not on file  Tobacco Use  . Smoking status: Never Smoker  . Smokeless tobacco: Never Used  Vaping Use  . Vaping Use: Never used  Substance and Sexual Activity  . Alcohol use: Not Currently    Alcohol/week: 1.0 standard drink    Types: 1 Glasses of wine per week    Comment: every several months  . Drug use: No  . Sexual activity: Yes    Partners: Female  Other Topics Concern  . Not on file  Social History Narrative  . Not on file   Social Determinants of Health   Financial Resource Strain: Not on file  Food Insecurity: Not on file  Transportation Needs: Not on file  Physical Activity: Not on file  Stress: Not on file  Social Connections: Not on file    Family History  Problem Relation Age of Onset   . Heart attack Father   . Emphysema Father   . Heart failure Father   . Alcohol abuse Maternal Aunt   . Alcohol abuse Maternal Uncle   . Diabetes Mother   . Hypertension Mother   . Colon cancer Neg Hx   . Esophageal cancer Neg Hx   . Rectal cancer Neg Hx   . Stomach cancer Neg Hx     Health Maintenance  Topic Date Due  . Fecal DNA (Cologuard)  Never done  . COVID-19 Vaccine (2 - Booster for [employer] series) 08/18/2019  . INFLUENZA VACCINE  09/24/2019  . TETANUS/TDAP  02/02/2030  . Hepatitis C Screening  Completed  . HIV Screening  Completed     ----------------------------------------------------------------------------------------------------------------------------------------------------------------------------------------------------------------- Physical Exam BP 120/83 (BP Location: Left Arm, Patient Position: Sitting, Cuff Size: Large)   Pulse 86   Wt 230 lb (104.3 kg)   SpO2 98%   BMI 34.97 kg/m   Physical Exam Constitutional:      Appearance: Normal appearance.  Eyes:     General: No scleral icterus. Musculoskeletal:     Cervical back: Neck supple.  Neurological:     General: No focal deficit present.     Mental Status: He is alert.  Psychiatric:        Mood and Affect: Mood normal.        Behavior: Behavior normal.     ------------------------------------------------------------------------------------------------------------------------------------------------------------------------------------------------------------------- Assessment and Plan  Adult hypothyroidism This is managed by [facility].  On 2.5grains of NP thyroid  Headache, migraine Continues to do well with aimovig, continue.   Attention deficit hyperactivity disorder Focalin continues to work well for him. Will continue at current strength. PDMP reviewed.   Low testosterone Managed by [facility]  Prediabetes A1c elevated at 7.1% at last appt at [facility].   He is working on dietary and lifestyle change to improve this.   GAD (generalized anxiety disorder) Diazepam renewed. He will continued to safeguard this from his wife.     Meds ordered this encounter  Medications  . diazepam (VALIUM) 10 MG tablet    Sig: TAKE 1 TABLET BY MOUTH AT BEDTIME AS NEEDED FOR ANXIETY    Dispense:  30 tablet    Refill:  1    Not to exceed 5 additional fills before 06/19/2020    Return in about 3 months (around 07/20/2020) for ADHD.    This visit occurred during the SARS-CoV-2 public health emergency.  Safety protocols were in place, including screening questions prior to the visit, additional usage of staff PPE, and extensive cleaning of  exam room while observing appropriate contact time as indicated for disinfecting solutions.

## 2020-04-22 NOTE — Assessment & Plan Note (Signed)
Focalin continues to work well for him. Will continue at current strength. PDMP reviewed.

## 2020-04-29 ENCOUNTER — Other Ambulatory Visit: Payer: Self-pay | Admitting: Nurse Practitioner

## 2020-04-29 DIAGNOSIS — G43019 Migraine without aura, intractable, without status migrainosus: Secondary | ICD-10-CM

## 2020-05-02 DIAGNOSIS — F4323 Adjustment disorder with mixed anxiety and depressed mood: Secondary | ICD-10-CM | POA: Diagnosis not present

## 2020-05-02 DIAGNOSIS — F902 Attention-deficit hyperactivity disorder, combined type: Secondary | ICD-10-CM | POA: Diagnosis not present

## 2020-05-07 ENCOUNTER — Other Ambulatory Visit: Payer: Self-pay

## 2020-05-07 DIAGNOSIS — G43019 Migraine without aura, intractable, without status migrainosus: Secondary | ICD-10-CM

## 2020-05-07 MED ORDER — AIMOVIG 140 MG/ML ~~LOC~~ SOAJ: 1.0000 | SUBCUTANEOUS | 12 refills | Status: DC

## 2020-05-27 ENCOUNTER — Encounter: Payer: Self-pay | Admitting: Family Medicine

## 2020-05-27 ENCOUNTER — Other Ambulatory Visit: Payer: Self-pay | Admitting: Family Medicine

## 2020-05-27 MED ORDER — DEXMETHYLPHENIDATE HCL ER 15 MG PO CP24
15.0000 mg | ORAL_CAPSULE | Freq: Every day | ORAL | 0 refills | Status: DC
Start: 2020-05-27 — End: 2020-05-30

## 2020-05-29 ENCOUNTER — Telehealth: Payer: Self-pay

## 2020-05-29 NOTE — Telephone Encounter (Signed)
Please check to see if another pharmacy may have in stock.  Thanks!

## 2020-05-29 NOTE — Telephone Encounter (Signed)
Received documentation from [facility] pharmacy that [facility] 15mg  is back ordered.   Please advise.

## 2020-05-30 ENCOUNTER — Other Ambulatory Visit: Payer: Self-pay | Admitting: Family Medicine

## 2020-05-30 DIAGNOSIS — F411 Generalized anxiety disorder: Secondary | ICD-10-CM | POA: Diagnosis not present

## 2020-05-30 DIAGNOSIS — F331 Major depressive disorder, recurrent, moderate: Secondary | ICD-10-CM | POA: Diagnosis not present

## 2020-05-30 MED ORDER — DEXMETHYLPHENIDATE HCL ER 15 MG PO CP24
15.0000 mg | ORAL_CAPSULE | Freq: Every day | ORAL | 0 refills | Status: DC
Start: 2020-05-30 — End: 2020-07-25

## 2020-05-30 NOTE — Telephone Encounter (Signed)
60 tabs available at [facility]   Patient has been advised of location for Rx pick-up

## 2020-06-27 DIAGNOSIS — F411 Generalized anxiety disorder: Secondary | ICD-10-CM | POA: Diagnosis not present

## 2020-06-27 DIAGNOSIS — F331 Major depressive disorder, recurrent, moderate: Secondary | ICD-10-CM | POA: Diagnosis not present

## 2020-06-28 DIAGNOSIS — R7303 Prediabetes: Secondary | ICD-10-CM | POA: Diagnosis not present

## 2020-07-02 ENCOUNTER — Other Ambulatory Visit: Payer: Self-pay

## 2020-07-02 ENCOUNTER — Ambulatory Visit: Payer: BC Managed Care – PPO | Admitting: Family Medicine

## 2020-07-02 ENCOUNTER — Encounter: Payer: Self-pay | Admitting: Family Medicine

## 2020-07-02 VITALS — BP 127/83 | HR 67 | Temp 98.7°F | Wt 225.2 lb

## 2020-07-02 DIAGNOSIS — J301 Allergic rhinitis due to pollen: Secondary | ICD-10-CM | POA: Diagnosis not present

## 2020-07-02 DIAGNOSIS — J0100 Acute maxillary sinusitis, unspecified: Secondary | ICD-10-CM | POA: Diagnosis not present

## 2020-07-02 MED ORDER — CLARITIN-D 24 HOUR 10-240 MG PO TB24: 1.0000 | ORAL_TABLET | Freq: Every day | ORAL | 0 refills | Status: AC

## 2020-07-02 MED ORDER — AMOXICILLIN-POT CLAVULANATE 875-125 MG PO TABS: 1.0000 | ORAL_TABLET | Freq: Two times a day (BID) | ORAL | 0 refills | Status: AC

## 2020-07-02 NOTE — Patient Instructions (Addendum)
Mucinex, Flonase, humidifier, warm compresses, tylenol/ibuprofen, saline nasal rinse Try to wait until Thursday/Friday at least before starting antibiotic.

## 2020-07-02 NOTE — Progress Notes (Signed)
Acute Office Visit  Subjective:    Patient ID: Scott Fields, male    DOB: Jun 30, 1965, 55 y.o.   MRN: [id number]  No chief complaint on file.   HPI Patient is in today for sinusitis and ear pain.  He has seasonal allergies which have been bothering him for the past 1.5-2 months or so - nasal congestion, sneezing, sinus pressure, PND, itchy/watery eyes. Last Thursday, he noticed a significant increase in his frontal/maxillary sinus pressure (5-7/10), headache, and stabbing right ear pain (4-8/10/). Symptoms seem to worsen when trying to lie down at night. Reports he does have an occasional productive cough first thing in the morning, but tends to clear up later in the day. He denies any chest pain, shortness of breath, wheezing, body aches, fever, GI symptoms, loss of tastes/smell.  He has been taking Claritin D and ibuprofen with minimal relief.   Wife is having surgery on Monday and he needs to be well to take care of her.    Past Medical History:  Diagnosis Date  . ADHD (attention deficit hyperactivity disorder)   . Allergy   . Appendicitis 1998  . Arthritis   . Asthma    Exercise Induced  . Fracture of wrist 1984  . Fracture, maxillary (HCC) 2009  . GERD (gastroesophageal reflux disease)   . Hyperlipidemia   . Low testosterone 09/15/2017  . PONV (postoperative nausea and vomiting)   . Prediabetes 09/15/2017  . Sleep apnea   . Staph infection 2000 and 2002   staph infections post surgeries appe and vasectomy  . Thyroid disease     Past Surgical History:  Procedure Laterality Date  . APPENDECTOMY    . CHONDROPLASTY Right 12/17/2016   Procedure: CHONDROPLASTY;  Surgeon: Loreta Ave, MD;  Location: [facility];  Service: Orthopedics;  Laterality: Right;  . KNEE ARTHROSCOPY Right 12/17/2016   Procedure: KNEE ARTHROSCOPY, REMOVAL OF LOOSE BODIES;  Surgeon: Loreta Ave, MD;  Location: [facility];  Service: Orthopedics;  Laterality:  Right;    Family History  Problem Relation Age of Onset  . Heart attack Father   . Emphysema Father   . Heart failure Father   . Alcohol abuse Maternal Aunt   . Alcohol abuse Maternal Uncle   . Diabetes Mother   . Hypertension Mother   . Colon cancer Neg Hx   . Esophageal cancer Neg Hx   . Rectal cancer Neg Hx   . Stomach cancer Neg Hx     Social History   Socioeconomic History  . Marital status: Married    Spouse name: Not on file  . Number of children: Not on file  . Years of education: Not on file  . Highest education level: Not on file  Occupational History  . Not on file  Tobacco Use  . Smoking status: Never Smoker  . Smokeless tobacco: Never Used  Vaping Use  . Vaping Use: Never used  Substance and Sexual Activity  . Alcohol use: Not Currently    Alcohol/week: 1.0 standard drink    Types: 1 Glasses of wine per week    Comment: every several months  . Drug use: No  . Sexual activity: Yes    Partners: Female  Other Topics Concern  . Not on file  Social History Narrative  . Not on file   Social Determinants of Health   Financial Resource Strain: Not on file  Food Insecurity: Not on file  Transportation Needs: Not on  file  Physical Activity: Not on file  Stress: Not on file  Social Connections: Not on file  Intimate Partner Violence: Not on file    Outpatient Medications Prior to Visit  Medication Sig Dispense Refill  . albuterol (PROAIR HFA) 108 (90 Base) MCG/ACT inhaler Inhale 2 puffs into the lungs every 6 (six) hours as needed for wheezing or shortness of breath. 8.5 Inhaler 11  . AMBULATORY NON FORMULARY MEDICATION Adjust continuous positive airway pressure (CPAP) machine to following settings  auto-titrate from 8-20 cm of H2O pressure. Send to [facility] [place].  Fax [phone] phone: [phone] 1 each 0  . cholecalciferol (VITAMIN D) 1000 units tablet Take 4,000 Units by mouth daily.    Marland Kitchen dexmethylphenidate (FOCALIN XR) 15 MG 24 hr  capsule Take 1 capsule (15 mg total) by mouth daily. 60 capsule 0  . diazepam (VALIUM) 10 MG tablet TAKE 1 TABLET BY MOUTH AT BEDTIME AS NEEDED FOR ANXIETY 30 tablet 1  . Erenumab-aooe (AIMOVIG) 140 MG/ML SOAJ Inject 140 mg into the skin every 30 (thirty) days. 1 mL 12  . MAGNESIUM CITRATE PO Take 250 mg by mouth.    . NP THYROID 60 MG tablet Take by mouth.    . Omega-3 Fatty Acids (FISH OIL) 1000 MG CAPS Take 1,000 mg by mouth.    . testosterone cypionate (DEPOTESTOSTERONE CYPIONATE) 200 MG/ML injection INJECT 0.4 MLS WEEKLY AS DIRECTED     No facility-administered medications prior to visit.    Allergies  Allergen Reactions  . Naproxen Nausea And Vomiting    Review of Systems All review of systems negative except what is listed in the HPI     Objective:    Physical Exam Constitutional:      Appearance: Normal appearance.  HENT:     Head: Normocephalic and atraumatic.     Right Ear: There is impacted cerumen.     Left Ear: There is impacted cerumen.     Mouth/Throat:     Mouth: Mucous membranes are moist.     Pharynx: Oropharynx is clear.  Cardiovascular:     Rate and Rhythm: Normal rate and regular rhythm.     Heart sounds: Normal heart sounds.  Pulmonary:     Effort: Pulmonary effort is normal.     Breath sounds: Normal breath sounds.  Musculoskeletal:        General: Normal range of motion.     Cervical back: Normal range of motion. No tenderness.  Lymphadenopathy:     Cervical: No cervical adenopathy.  Skin:    General: Skin is warm and dry.     Capillary Refill: Capillary refill takes less than 2 seconds.     Findings: No rash.  Neurological:     General: No focal deficit present.     Mental Status: He is alert and oriented to person, place, and time.  Psychiatric:        Mood and Affect: Mood normal.        Behavior: Behavior normal.        Thought Content: Thought content normal.        Judgment: Judgment normal.     BP 127/83   Pulse 67   Temp 98.7  F (37.1 C)   Wt 225 lb 3.2 oz (102.2 kg)   SpO2 96%   BMI 34.24 kg/m  Wt Readings from Last 3 Encounters:  07/02/20 225 lb 3.2 oz (102.2 kg)  04/22/20 230 lb (104.3 kg)  03/25/20 235 lb (106.6 kg)  Health Maintenance Due  Topic Date Due  . Fecal DNA (Cologuard)  Never done  . COVID-19 Vaccine (2 - Booster for [employer] series) 08/18/2019    There are no preventive care reminders to display for this patient.   Lab Results  Component Value Date   TSH 5.36 (H) 07/01/2018   Lab Results  Component Value Date   WBC 4.2 07/01/2018   HGB 15.3 07/01/2018   HCT 45.2 07/01/2018   MCV 90.0 07/01/2018   PLT 158 07/01/2018   Lab Results  Component Value Date   NA 140 07/01/2018   K 4.6 07/01/2018   CO2 26 07/01/2018   GLUCOSE 138 07/01/2018   BUN 16 07/01/2018   CREATININE 1.21 07/01/2018   BILITOT 0.5 07/01/2018   ALKPHOS 70 05/21/2017   AST 21 07/01/2018   ALT 25 07/01/2018   PROT 6.7 07/01/2018   ALBUMIN 4.2 10/20/2016   CALCIUM 9.1 07/01/2018   Lab Results  Component Value Date   CHOL 170 09/03/2017   Lab Results  Component Value Date   HDL 46 09/03/2017   Lab Results  Component Value Date   LDLCALC 106 (H) 09/03/2017   Lab Results  Component Value Date   TRIG 89 09/03/2017   Lab Results  Component Value Date   CHOLHDL 3.7 09/03/2017   Lab Results  Component Value Date   HGBA1C 5.8 05/21/2017       Assessment & Plan:   1. Acute maxillary sinusitis, recurrence not specified 2. Seasonal allergic rhinitis due to pollen  Seasonal allergies possibly leading to secondary infection. Would prefer to get him closer to the 10-day point of symptoms, but given the severity of symptoms and his desire to be well by the time his wife has surgery on Monday, will go ahead and send in Augmentin - encouraged him to wait a few more days before starting. Add conservative management first including rest, hydration, humidifier use, mucinex, flonase, saline spray, etc.  Unable to visualize TM due to bilateral impaction - he did want irrigation today; recommend Debrox drops.   Patient agreeable to plan. Educated on signs and symptoms that would require further evaluation.  - amoxicillin-clavulanate (AUGMENTIN) 875-125 MG tablet; Take 1 tablet by mouth 2 (two) times daily for 5 days.  Dispense: 10 tablet; Refill: 0 - loratadine-pseudoephedrine (CLARITIN-D 24 HOUR) 10-240 MG 24 hr tablet; Take 1 tablet by mouth daily.  Dispense: 30 tablet; Refill: 0  Follow-up if symptoms worsen or fail to improve.    Clayborne Dana, NP

## 2020-07-16 DIAGNOSIS — F439 Reaction to severe stress, unspecified: Secondary | ICD-10-CM | POA: Diagnosis not present

## 2020-07-16 DIAGNOSIS — F32A Depression, unspecified: Secondary | ICD-10-CM | POA: Diagnosis not present

## 2020-07-16 DIAGNOSIS — E119 Type 2 diabetes mellitus without complications: Secondary | ICD-10-CM | POA: Diagnosis not present

## 2020-07-24 ENCOUNTER — Other Ambulatory Visit: Payer: Self-pay | Admitting: Family Medicine

## 2020-07-25 ENCOUNTER — Other Ambulatory Visit: Payer: Self-pay | Admitting: Family Medicine

## 2020-07-25 ENCOUNTER — Encounter: Payer: Self-pay | Admitting: Family Medicine

## 2020-07-25 DIAGNOSIS — F331 Major depressive disorder, recurrent, moderate: Secondary | ICD-10-CM | POA: Diagnosis not present

## 2020-07-25 DIAGNOSIS — F411 Generalized anxiety disorder: Secondary | ICD-10-CM | POA: Diagnosis not present

## 2020-07-25 MED ORDER — DEXMETHYLPHENIDATE HCL ER 15 MG PO CP24
15.0000 mg | ORAL_CAPSULE | Freq: Every day | ORAL | 0 refills | Status: DC
Start: 2020-07-25 — End: 2020-09-26

## 2020-08-22 DIAGNOSIS — F411 Generalized anxiety disorder: Secondary | ICD-10-CM | POA: Diagnosis not present

## 2020-08-22 DIAGNOSIS — F331 Major depressive disorder, recurrent, moderate: Secondary | ICD-10-CM | POA: Diagnosis not present

## 2020-09-26 ENCOUNTER — Other Ambulatory Visit: Payer: Self-pay | Admitting: Family Medicine

## 2020-09-26 ENCOUNTER — Encounter: Payer: Self-pay | Admitting: Family Medicine

## 2020-09-26 MED ORDER — DEXMETHYLPHENIDATE HCL ER 15 MG PO CP24
15.0000 mg | ORAL_CAPSULE | Freq: Every day | ORAL | 0 refills | Status: DC
Start: 2020-09-26 — End: 2020-11-05

## 2020-09-26 NOTE — Telephone Encounter (Signed)
Refilled x1 month.  Last refill until seen.  Thanks!

## 2020-10-04 DIAGNOSIS — E039 Hypothyroidism, unspecified: Secondary | ICD-10-CM | POA: Diagnosis not present

## 2020-10-04 DIAGNOSIS — E291 Testicular hypofunction: Secondary | ICD-10-CM | POA: Diagnosis not present

## 2020-10-04 DIAGNOSIS — F32A Depression, unspecified: Secondary | ICD-10-CM | POA: Diagnosis not present

## 2020-10-04 DIAGNOSIS — F439 Reaction to severe stress, unspecified: Secondary | ICD-10-CM | POA: Diagnosis not present

## 2020-10-04 DIAGNOSIS — E119 Type 2 diabetes mellitus without complications: Secondary | ICD-10-CM | POA: Diagnosis not present

## 2020-10-04 DIAGNOSIS — E559 Vitamin D deficiency, unspecified: Secondary | ICD-10-CM | POA: Diagnosis not present

## 2020-10-04 LAB — BASIC METABOLIC PANEL
BUN: 19 (ref 4–21)
CO2: 26 — AB (ref 13–22)
Chloride: 103 (ref 99–108)
Creatinine: 1.2 (ref 0.6–1.3)
Glucose: 119
Potassium: 4.2 (ref 3.4–5.3)
Sodium: 134 — AB (ref 137–147)

## 2020-10-04 LAB — HEPATIC FUNCTION PANEL
ALT: 28 (ref 10–40)
AST: 27 (ref 14–40)
Alkaline Phosphatase: 89 (ref 25–125)
Bilirubin, Total: 0.8

## 2020-10-04 LAB — LIPID PANEL
Cholesterol: 210 — AB (ref 0–200)
HDL: 53 (ref 35–70)
LDL Cholesterol: 141
Triglycerides: 88 (ref 40–160)

## 2020-10-04 LAB — PSA: PSA: 0.89

## 2020-10-04 LAB — COMPREHENSIVE METABOLIC PANEL
Albumin: 4.5 (ref 3.5–5.0)
Calcium: 8.7 (ref 8.7–10.7)
GFR calc Af Amer: 75
GFR calc non Af Amer: 62

## 2020-10-04 LAB — TESTOSTERONE: Testosterone: 1101

## 2020-10-04 LAB — CBC AND DIFFERENTIAL
HCT: 50 (ref 41–53)
Hemoglobin: 16 (ref 13.5–17.5)
Platelets: 209 (ref 150–399)
WBC: 7.1

## 2020-10-04 LAB — HEMOGLOBIN A1C: Hemoglobin A1C: 6

## 2020-10-04 LAB — VITAMIN D 25 HYDROXY (VIT D DEFICIENCY, FRACTURES): Vit D, 25-Hydroxy: 76.8

## 2020-10-04 LAB — CBC: RBC: 5.38 — AB (ref 3.87–5.11)

## 2020-10-04 LAB — TSH: TSH: 0.04 — AB (ref 0.41–5.90)

## 2020-10-16 DIAGNOSIS — F411 Generalized anxiety disorder: Secondary | ICD-10-CM | POA: Diagnosis not present

## 2020-10-16 DIAGNOSIS — F331 Major depressive disorder, recurrent, moderate: Secondary | ICD-10-CM | POA: Diagnosis not present

## 2020-10-17 DIAGNOSIS — E119 Type 2 diabetes mellitus without complications: Secondary | ICD-10-CM | POA: Diagnosis not present

## 2020-10-17 DIAGNOSIS — F439 Reaction to severe stress, unspecified: Secondary | ICD-10-CM | POA: Diagnosis not present

## 2020-10-17 DIAGNOSIS — E039 Hypothyroidism, unspecified: Secondary | ICD-10-CM | POA: Diagnosis not present

## 2020-10-17 DIAGNOSIS — R7989 Other specified abnormal findings of blood chemistry: Secondary | ICD-10-CM | POA: Diagnosis not present

## 2020-10-23 ENCOUNTER — Encounter: Payer: Self-pay | Admitting: Family Medicine

## 2020-11-05 ENCOUNTER — Encounter: Payer: Self-pay | Admitting: Family Medicine

## 2020-11-05 ENCOUNTER — Ambulatory Visit: Payer: BC Managed Care – PPO | Admitting: Family Medicine

## 2020-11-05 VITALS — BP 105/70 | HR 65 | Temp 98.0°F | Ht 68.0 in | Wt 194.0 lb

## 2020-11-05 DIAGNOSIS — R7303 Prediabetes: Secondary | ICD-10-CM

## 2020-11-05 DIAGNOSIS — G43019 Migraine without aura, intractable, without status migrainosus: Secondary | ICD-10-CM

## 2020-11-05 DIAGNOSIS — F902 Attention-deficit hyperactivity disorder, combined type: Secondary | ICD-10-CM

## 2020-11-05 DIAGNOSIS — E039 Hypothyroidism, unspecified: Secondary | ICD-10-CM | POA: Diagnosis not present

## 2020-11-05 DIAGNOSIS — F411 Generalized anxiety disorder: Secondary | ICD-10-CM

## 2020-11-05 DIAGNOSIS — R7989 Other specified abnormal findings of blood chemistry: Secondary | ICD-10-CM | POA: Diagnosis not present

## 2020-11-05 DIAGNOSIS — J45990 Exercise induced bronchospasm: Secondary | ICD-10-CM

## 2020-11-05 MED ORDER — ALBUTEROL SULFATE HFA 108 (90 BASE) MCG/ACT IN AERS: 2.0000 | INHALATION_SPRAY | Freq: Four times a day (QID) | RESPIRATORY_TRACT | 11 refills | Status: DC | PRN

## 2020-11-05 MED ORDER — DIAZEPAM 10 MG PO TABS: ORAL_TABLET | ORAL | 1 refills | Status: DC

## 2020-11-05 MED ORDER — DEXMETHYLPHENIDATE HCL ER 15 MG PO CP24
15.0000 mg | ORAL_CAPSULE | Freq: Every day | ORAL | 0 refills | Status: DC
Start: 2020-11-05 — End: 2021-05-15

## 2020-11-05 NOTE — Assessment & Plan Note (Signed)
Using diazepam occasionally as needed for anxiety.  Increased stress recently.  He does safeguard these from his spouse.

## 2020-11-05 NOTE — Assessment & Plan Note (Signed)
He is seeing [facility] for management of this.  TSH is suppressed.  We we discussed symptoms of hyperthyroidism which may be a result of iatrogenic causes.  He will monitor for these.

## 2020-11-05 NOTE — Patient Instructions (Signed)
Great to see you today! Continue current medications.  See me again in 6 months.  

## 2020-11-05 NOTE — Assessment & Plan Note (Signed)
Using albuterol intermittently.  Prescription refilled.

## 2020-11-05 NOTE — Assessment & Plan Note (Signed)
Methylphenidate continues to work well for management of ADHD symptoms.  No significant side effects noted with this.

## 2020-11-05 NOTE — Progress Notes (Signed)
Johathan Province - 55 y.o. male MRN [id number]  Date of birth: 1965-09-03  Subjective Chief Complaint  Patient presents with   Hypothyroidism   ADHD   Migraine   Low Testosterone   Anxiety   IFG   Hemorrhoids    HPI Scott [name] a 55 year old male here today for a follow-up visit.  He is also seeing [facility] for management of thyroid medication and testosterone.  He brings in a copy of his recent lab work showing TSH levels are suppressed however T4 and T3 levels are normal.  He feels good at current dose and denies any side effects at this time.  Testosterone levels were elevated as well with some mild elevation in estradiol levels.  No adjustments were made to his testosterone at that time.  He has been working hard to increase his muscle mass and improve his weight.  He continues to deal with stress and anxiety related to several things including work, moving his mother here recently from [place] and his wife's health problems.  He does take Valium 1-2 times per week which is helpful for his anxiety.  Methylphenidate continues to work well for management of his ADHD symptoms.  No significant side effects related to this.  Migraines are well managed with Aimovig.  Rarely has breakthrough migraines.  ROS:  A comprehensive ROS was completed and negative except as noted per HPI  Allergies  Allergen Reactions   Naproxen Nausea And Vomiting    Past Medical History:  Diagnosis Date   ADHD (attention deficit hyperactivity disorder)    Allergy    Appendicitis 1998   Arthritis    Asthma    Exercise Induced   Fracture of wrist 1984   Fracture, maxillary (HCC) 2009   GERD (gastroesophageal reflux disease)    Hyperlipidemia    Low testosterone 09/15/2017   PONV (postoperative nausea and vomiting)    Prediabetes 09/15/2017   Sleep apnea    Staph infection 2000 and 2002   staph infections post surgeries appe and vasectomy   Thyroid disease     Past Surgical  History:  Procedure Laterality Date   APPENDECTOMY     CHONDROPLASTY Right 12/17/2016   Procedure: CHONDROPLASTY;  Surgeon: Loreta Ave, MD;  Location: [facility];  Service: Orthopedics;  Laterality: Right;   KNEE ARTHROSCOPY Right 12/17/2016   Procedure: KNEE ARTHROSCOPY, REMOVAL OF LOOSE BODIES;  Surgeon: Loreta Ave, MD;  Location: [facility];  Service: Orthopedics;  Laterality: Right;    Social History   Socioeconomic History   Marital status: Married    Spouse name: Not on file   Number of children: Not on file   Years of education: Not on file   Highest education level: Not on file  Occupational History   Not on file  Tobacco Use   Smoking status: Never   Smokeless tobacco: Never  Vaping Use   Vaping Use: Never used  Substance and Sexual Activity   Alcohol use: Not Currently    Alcohol/week: 1.0 standard drink    Types: 1 Glasses of wine per week    Comment: every several months   Drug use: No   Sexual activity: Yes    Partners: Female  Other Topics Concern   Not on file  Social History Narrative   Not on file   Social Determinants of Health   Financial Resource Strain: Not on file  Food Insecurity: Not on file  Transportation Needs: Not on file  Physical Activity: Not on file  Stress: Not on file  Social Connections: Not on file    Family History  Problem Relation Age of Onset   Heart attack Father    Emphysema Father    Heart failure Father    Alcohol abuse Maternal Aunt    Alcohol abuse Maternal Uncle    Diabetes Mother    Hypertension Mother    Colon cancer Neg Hx    Esophageal cancer Neg Hx    Rectal cancer Neg Hx    Stomach cancer Neg Hx     Health Maintenance  Topic Date Due   Fecal DNA (Cologuard)  Never done   Zoster Vaccines- Shingrix (1 of 2) Never done   COVID-19 Vaccine (2 - Booster for [employer] series) 08/18/2019   INFLUENZA VACCINE  09/23/2020   TETANUS/TDAP  02/02/2030   Hepatitis C  Screening  Completed   HIV Screening  Completed   Pneumococcal Vaccine 28-51 Years old  Aged Out   HPV VACCINES  Aged Out     ----------------------------------------------------------------------------------------------------------------------------------------------------------------------------------------------------------------- Physical Exam BP 105/70   Pulse 65   Temp 98 F (36.7 C)   Ht 5\' 8"  (1.727 m)   Wt 194 lb (88 kg)   SpO2 99%   BMI 29.50 kg/m   Physical Exam Constitutional:      Appearance: Normal appearance.  HENT:     Head: Normocephalic and atraumatic.  Eyes:     General: No scleral icterus. Cardiovascular:     Rate and Rhythm: Normal rate and regular rhythm.  Pulmonary:     Effort: Pulmonary effort is normal.     Breath sounds: Normal breath sounds.  Musculoskeletal:     Cervical back: Neck supple.  Neurological:     General: No focal deficit present.     Mental Status: He is alert.  Psychiatric:        Mood and Affect: Mood normal.        Behavior: Behavior normal.    ------------------------------------------------------------------------------------------------------------------------------------------------------------------------------------------------------------------- Assessment and Plan  Headache, migraine Aimovig is working very well for management of his migraines.  He will continue this.  Exercise-induced asthma Using albuterol intermittently.  Prescription refilled.  Adult hypothyroidism He is seeing [facility] for management of this.  TSH is suppressed.  We we discussed symptoms of hyperthyroidism which may be a result of iatrogenic causes.  He will monitor for these.  Low testosterone Managed by integrative health.  GAD (generalized anxiety disorder) Using diazepam occasionally as needed for anxiety.  Increased stress recently.  He does safeguard these from his spouse.  Attention deficit hyperactivity  disorder Methylphenidate continues to work well for management of ADHD symptoms.  No significant side effects noted with this.   Meds ordered this encounter  Medications   albuterol (PROAIR HFA) 108 (90 Base) MCG/ACT inhaler    Sig: Inhale 2 puffs into the lungs every 6 (six) hours as needed for wheezing or shortness of breath.    Dispense:  8.5 each    Refill:  11   dexmethylphenidate (FOCALIN XR) 15 MG 24 hr capsule    Sig: Take 1 capsule (15 mg total) by mouth daily.    Dispense:  60 capsule    Refill:  0   diazepam (VALIUM) 10 MG tablet    Sig: Take daily as needed for anxiety    Dispense:  30 tablet    Refill:  1    Not to exceed 4 additional fills before 10/19/2020    Return in  about 6 months (around 05/05/2021) for ADHD.    This visit occurred during the SARS-CoV-2 public health emergency.  Safety protocols were in place, including screening questions prior to the visit, additional usage of staff PPE, and extensive cleaning of exam room while observing appropriate contact time as indicated for disinfecting solutions.

## 2020-11-05 NOTE — Assessment & Plan Note (Signed)
Aimovig is working very well for management of his migraines.  He will continue this.

## 2020-11-05 NOTE — Assessment & Plan Note (Signed)
Managed by [facility].

## 2020-11-14 DIAGNOSIS — F331 Major depressive disorder, recurrent, moderate: Secondary | ICD-10-CM | POA: Diagnosis not present

## 2020-11-14 DIAGNOSIS — F411 Generalized anxiety disorder: Secondary | ICD-10-CM | POA: Diagnosis not present

## 2020-12-03 ENCOUNTER — Encounter: Payer: Self-pay | Admitting: Family Medicine

## 2020-12-03 ENCOUNTER — Ambulatory Visit: Payer: BC Managed Care – PPO | Admitting: Family Medicine

## 2020-12-03 DIAGNOSIS — L259 Unspecified contact dermatitis, unspecified cause: Secondary | ICD-10-CM | POA: Diagnosis not present

## 2020-12-03 MED ORDER — PREDNISONE 10 MG (21) PO TBPK: ORAL_TABLET | ORAL | 0 refills | Status: DC

## 2020-12-03 MED ORDER — MUPIROCIN 2 % EX OINT: 1.0000 "application " | TOPICAL_OINTMENT | Freq: Two times a day (BID) | CUTANEOUS | 0 refills | Status: AC

## 2020-12-03 NOTE — Assessment & Plan Note (Signed)
Rash with appearance consistent with contact dermatitis with possible bacterial superinfection.  Starting course of prednisone and will have him apply topical mupirocin over the next week.  He will let me know if not improving with this.

## 2020-12-03 NOTE — Patient Instructions (Signed)
Start steroid pack  Use mupirocin twice daily for 7 days.  Let me know if not resolving with this.

## 2020-12-03 NOTE — Progress Notes (Signed)
Scott Fields - 55 y.o. male MRN [id number]  Date of birth: October 31, 1965  Subjective Chief Complaint  Patient presents with   Rash    HPI Scott [name] a 55 year old male here today with complaint of rash.  Rash started about 1 week ago.  He is located on the inside of the left thigh towards the groin.  Initially had itching with this now describes burning sensation.  There has been some weeping.  He denies fever, chills.  He has tried [employer] without improvement.  ROS:  A comprehensive ROS was completed and negative except as noted per HPI  Allergies  Allergen Reactions   Naproxen Nausea And Vomiting    Past Medical History:  Diagnosis Date   ADHD (attention deficit hyperactivity disorder)    Allergy    Appendicitis 1998   Arthritis    Asthma    Exercise Induced   Fracture of wrist 1984   Fracture, maxillary (HCC) 2009   GERD (gastroesophageal reflux disease)    Hyperlipidemia    Low testosterone 09/15/2017   PONV (postoperative nausea and vomiting)    Prediabetes 09/15/2017   Sleep apnea    Staph infection 2000 and 2002   staph infections post surgeries appe and vasectomy   Thyroid disease     Past Surgical History:  Procedure Laterality Date   APPENDECTOMY     CHONDROPLASTY Right 12/17/2016   Procedure: CHONDROPLASTY;  Surgeon: Loreta Ave, MD;  Location: [facility];  Service: Orthopedics;  Laterality: Right;   KNEE ARTHROSCOPY Right 12/17/2016   Procedure: KNEE ARTHROSCOPY, REMOVAL OF LOOSE BODIES;  Surgeon: Loreta Ave, MD;  Location: [facility];  Service: Orthopedics;  Laterality: Right;    Social History   Socioeconomic History   Marital status: Married    Spouse name: Not on file   Number of children: Not on file   Years of education: Not on file   Highest education level: Not on file  Occupational History   Not on file  Tobacco Use   Smoking status: Never   Smokeless tobacco: Never  Vaping Use   Vaping Use: Never  used  Substance and Sexual Activity   Alcohol use: Not Currently    Alcohol/week: 1.0 standard drink    Types: 1 Glasses of wine per week    Comment: every several months   Drug use: No   Sexual activity: Yes    Partners: Female  Other Topics Concern   Not on file  Social History Narrative   Not on file   Social Determinants of Health   Financial Resource Strain: Not on file  Food Insecurity: Not on file  Transportation Needs: Not on file  Physical Activity: Not on file  Stress: Not on file  Social Connections: Not on file    Family History  Problem Relation Age of Onset   Heart attack Father    Emphysema Father    Heart failure Father    Alcohol abuse Maternal Aunt    Alcohol abuse Maternal Uncle    Diabetes Mother    Hypertension Mother    Colon cancer Neg Hx    Esophageal cancer Neg Hx    Rectal cancer Neg Hx    Stomach cancer Neg Hx     Health Maintenance  Topic Date Due   Fecal DNA (Cologuard)  Never done   Zoster Vaccines- Shingrix (1 of 2) Never done   COVID-19 Vaccine (2 - Booster for [employer] series) 08/18/2019  INFLUENZA VACCINE  09/23/2020   TETANUS/TDAP  02/02/2030   Hepatitis C Screening  Completed   HIV Screening  Completed   HPV VACCINES  Aged Out     ----------------------------------------------------------------------------------------------------------------------------------------------------------------------------------------------------------------- Physical Exam BP 106/65 (BP Location: Left Arm, Patient Position: Sitting, Cuff Size: Large)   Pulse 67   Ht 5\' 8"  (1.727 m)   Wt 193 lb 9.6 oz (87.8 kg)   SpO2 97%   BMI 29.44 kg/m   Physical Exam Constitutional:      Appearance: Normal appearance.  Eyes:     General: No scleral icterus. Musculoskeletal:     Cervical back: Neck supple.     Comments: Excoriated, erythematous rash along the left upper inner thigh.  There is a small area of central ulceration with small amount of  exudate.  Neurological:     General: No focal deficit present.     Mental Status: He is alert.  Psychiatric:        Mood and Affect: Mood normal.    ------------------------------------------------------------------------------------------------------------------------------------------------------------------------------------------------------------------- Assessment and Plan  Contact dermatitis Rash with appearance consistent with contact dermatitis with possible bacterial superinfection.  Starting course of prednisone and will have him apply topical mupirocin over the next week.  He will let me know if not improving with this.   Meds ordered this encounter  Medications   mupirocin ointment (BACTROBAN) 2 %    Sig: Apply 1 application topically 2 (two) times daily for 7 days.    Dispense:  22 g    Refill:  0   predniSONE (STERAPRED UNI-PAK 21 TAB) 10 MG (21) TBPK tablet    Sig: Taper as directed on packaging.    Dispense:  21 tablet    Refill:  0    No follow-ups on file.    This visit occurred during the SARS-CoV-2 public health emergency.  Safety protocols were in place, including screening questions prior to the visit, additional usage of staff PPE, and extensive cleaning of exam room while observing appropriate contact time as indicated for disinfecting solutions.

## 2020-12-12 DIAGNOSIS — F411 Generalized anxiety disorder: Secondary | ICD-10-CM | POA: Diagnosis not present

## 2020-12-12 DIAGNOSIS — F331 Major depressive disorder, recurrent, moderate: Secondary | ICD-10-CM | POA: Diagnosis not present

## 2021-01-02 ENCOUNTER — Other Ambulatory Visit: Payer: Self-pay

## 2021-01-02 ENCOUNTER — Emergency Department (INDEPENDENT_AMBULATORY_CARE_PROVIDER_SITE_OTHER)
Admission: EM | Admit: 2021-01-02 | Discharge: 2021-01-02 | Disposition: A | Discharge status: Home / Self Care | Payer: BC Managed Care – PPO | Source: Home / Self Care

## 2021-01-02 ENCOUNTER — Encounter: Payer: Self-pay | Admitting: Emergency Medicine

## 2021-01-02 DIAGNOSIS — J309 Allergic rhinitis, unspecified: Secondary | ICD-10-CM

## 2021-01-02 DIAGNOSIS — R350 Frequency of micturition: Secondary | ICD-10-CM

## 2021-01-02 LAB — POCT URINALYSIS DIP (MANUAL ENTRY)
Bilirubin, UA: NEGATIVE
Blood, UA: NEGATIVE
Glucose, UA: NEGATIVE mg/dL
Ketones, POC UA: NEGATIVE mg/dL
Leukocytes, UA: NEGATIVE
Nitrite, UA: NEGATIVE
Protein Ur, POC: NEGATIVE mg/dL
Spec Grav, UA: 1.02 (ref 1.010–1.025)
Urobilinogen, UA: 0.2 E.U./dL
pH, UA: 6 (ref 5.0–8.0)

## 2021-01-02 NOTE — ED Triage Notes (Signed)
Fever on Monday night  Left ear pain w/ sinus drainage w/ sore throat Claritin D & ibuprofen OTC - min relief Frequency w/ retention x 1 week  Lower back pain w/ left lower abdominal  pain No hx  of kidney stones  UTI 2 years ago - feels like  one now  No flu vaccine

## 2021-01-02 NOTE — Discharge Instructions (Addendum)
Advised encouraged patient to take OTC Allegra 180 mg daily for the next 5 to 7 days for concurrent postnasal drainage/drip. Advised patient if symptoms (urinary frequency) worsen and/or unresolved please follow-up with PCP for further evaluation.

## 2021-01-02 NOTE — ED Provider Notes (Signed)
[facility]    CSN: [id number] Arrival date & time: 01/02/21  0737      History   Chief Complaint Chief Complaint  Patient presents with   Otalgia    Left     HPI Scott [name] a 55 y.o. male.   HPI 55 year old male presents with increased urinary frequency and retention for 1 week.  Patient reports having UTI 2 years ago and believes he has 1 now.  Additionally patient reports fever on Monday night, left ear pain with sinus drainage and sore throat.  Past Medical History:  Diagnosis Date   ADHD (attention deficit hyperactivity disorder)    Allergy    Appendicitis 1998   Arthritis    Asthma    Exercise Induced   Fracture of wrist 1984   Fracture, maxillary (HCC) 2009   GERD (gastroesophageal reflux disease)    Hyperlipidemia    Low testosterone 09/15/2017   PONV (postoperative nausea and vomiting)    Prediabetes 09/15/2017   Sleep apnea    Staph infection 2000 and 2002   staph infections post surgeries appe and vasectomy   Thyroid disease     Patient Active Problem List   Diagnosis Date Noted   Contact dermatitis 12/03/2020   GAD (generalized anxiety disorder) 04/22/2020   Prediabetes 09/15/2017   Low testosterone 09/15/2017   Chondromalacia, patella, left 10/07/2016   Seborrheic dermatitis 12/16/2015   Headache, migraine 08/06/2015   Exercise-induced asthma 08/06/2015   Attention deficit hyperactivity disorder 07/02/2011   HLD (hyperlipidemia) 02/20/2011   Avitaminosis D 02/20/2011   OSA (obstructive sleep apnea) 12/10/2010   Adult hypothyroidism 10/29/2010    Past Surgical History:  Procedure Laterality Date   APPENDECTOMY     CHONDROPLASTY Right 12/17/2016   Procedure: CHONDROPLASTY;  Surgeon: Loreta Ave, MD;  Location: [facility];  Service: Orthopedics;  Laterality: Right;   KNEE ARTHROSCOPY Right 12/17/2016   Procedure: KNEE ARTHROSCOPY, REMOVAL OF LOOSE BODIES;  Surgeon: Loreta Ave, MD;  Location: [facility];  Service: Orthopedics;  Laterality: Right;       Home Medications    Prior to Admission medications   Medication Sig Start Date End Date Taking? Authorizing Provider  albuterol (PROAIR HFA) 108 (90 Base) MCG/ACT inhaler Inhale 2 puffs into the lungs every 6 (six) hours as needed for wheezing or shortness of breath. 11/05/20   Everrett Coombe, DO  AMBULATORY NON FORMULARY MEDICATION Adjust continuous positive airway pressure (CPAP) machine to following settings  auto-titrate from 8-20 cm of H2O pressure. Send to [facility] [place].  Fax [phone] phone: [phone] 12/15/18   Rodolph Bong, MD  cholecalciferol (VITAMIN D) 1000 units tablet Take 4,000 Units by mouth daily.    [provider]  dexmethylphenidate (FOCALIN XR) 15 MG 24 hr capsule Take 1 capsule (15 mg total) by mouth daily. 11/05/20   Everrett Coombe, DO  diazepam (VALIUM) 10 MG tablet Take daily as needed for anxiety 11/05/20   Everrett Coombe, DO  Erenumab-aooe (AIMOVIG) 140 MG/ML SOAJ Inject 140 mg into the skin every 30 (thirty) days. 05/07/20   Everrett Coombe, DO  MAGNESIUM CITRATE PO Take 250 mg by mouth.    [provider]  NP THYROID 60 MG tablet Take by mouth. 03/28/20   [provider]  Omega-3 Fatty Acids (FISH OIL) 1000 MG CAPS Take 1,000 mg by mouth. Patient not taking: Reported on 01/02/2021    [provider]  predniSONE (STERAPRED UNI-PAK 21 TAB)  10 MG (21) TBPK tablet Taper as directed on packaging. 12/03/20   Everrett Coombe, DO  testosterone cypionate (DEPOTESTOSTERONE CYPIONATE) 200 MG/ML injection INJECT 0.4 MLS WEEKLY AS DIRECTED 05/20/18   [provider]    Family History Family History  Problem Relation Age of Onset   Diabetes Mother    Hypertension Mother    Heart attack Father    Emphysema Father    Heart failure Father    Alcohol abuse Maternal Aunt    Alcohol abuse Maternal Uncle    Colon cancer Neg Hx    Esophageal cancer Neg  Hx    Rectal cancer Neg Hx    Stomach cancer Neg Hx     Social History Social History   Tobacco Use   Smoking status: Never   Smokeless tobacco: Never  Vaping Use   Vaping Use: Never used  Substance Use Topics   Alcohol use: Not Currently    Alcohol/week: 1.0 standard drink    Types: 1 Glasses of wine per week    Comment: every several months   Drug use: No     Allergies   Naproxen   Review of Systems Review of Systems  Constitutional:  Positive for fever.  HENT:  Positive for sore throat.   Genitourinary:  Positive for frequency and urgency.  All other systems reviewed and are negative.   Physical Exam Triage Vital Signs ED Triage Vitals  Enc Vitals Group     BP 01/02/21 0841 117/85     Pulse Rate 01/02/21 0841 92     Resp 01/02/21 0841 16     Temp 01/02/21 0841 99.1 F (37.3 C)     Temp Source 01/02/21 0841 Oral     SpO2 01/02/21 0841 98 %     Weight 01/02/21 0843 198 lb (89.8 kg)     Height 01/02/21 0843 5\' 8"  (1.727 m)     Head Circumference --      Peak Flow --      Pain Score 01/02/21 0842 4     Pain Loc --      Pain Edu? --      Excl. in GC? --    No data found.  Updated Vital Signs BP 117/85 (BP Location: Left Arm)   Pulse 92   Temp 99.1 F (37.3 C) (Oral)   Resp 16   Ht 5\' 8"  (1.727 m)   Wt 198 lb (89.8 kg)   SpO2 98%   BMI 30.11 kg/m      Physical Exam Vitals and nursing note reviewed.  Constitutional:      General: He is not in acute distress.    Appearance: Normal appearance. He is obese. He is not ill-appearing.  HENT:     Head: Normocephalic and atraumatic.     Right Ear: Tympanic membrane, ear canal and external ear normal.     Left Ear: Tympanic membrane, ear canal and external ear normal.     Mouth/Throat:     Mouth: Mucous membranes are moist.     Pharynx: Oropharynx is clear.     Comments: Moderate amount of clear drainage of posterior oropharynx noted Eyes:     Extraocular Movements: Extraocular movements intact.      Conjunctiva/sclera: Conjunctivae normal.     Pupils: Pupils are equal, round, and reactive to light.  Cardiovascular:     Rate and Rhythm: Normal rate and regular rhythm.     Pulses: Normal pulses.     Heart sounds: Normal  heart sounds.  Pulmonary:     Effort: Pulmonary effort is normal.     Breath sounds: Normal breath sounds.  Musculoskeletal:        General: Normal range of motion.     Cervical back: Normal range of motion and neck supple.  Skin:    General: Skin is warm and dry.  Neurological:     General: No focal deficit present.     Mental Status: He is alert and oriented to person, place, and time.     UC Treatments / Results  Labs (all labs ordered are listed, but only abnormal results are displayed) Labs Reviewed  POCT URINALYSIS DIP (MANUAL ENTRY)    EKG   Radiology No results found.  Procedures Procedures (including critical care time)  Medications Ordered in UC Medications - No data to display  Initial Impression / Assessment and Plan / UC Course  I have reviewed the triage vital signs and the nursing notes.  Pertinent labs & imaging results that were available during my care of the patient were reviewed by me and considered in my medical decision making (see chart for details).     MDM: 1.  Urinary frequency-UA completely unremarkable-Advised patient if symptoms worsen and/or unresolved please follow-up with PCP for further evaluation; 2.  Allergic rhinitis-Advised encouraged patient to take OTC Allegra 180 mg daily for the next 5 to 7 days for concurrent postnasal drainage/drip. Final Clinical Impressions(s) / UC Diagnoses   Final diagnoses:  Urinary frequency  Allergic rhinitis, unspecified seasonality, unspecified trigger     Discharge Instructions      Advised encouraged patient to take OTC Allegra 180 mg daily for the next 5 to 7 days for concurrent postnasal drainage/drip. Advised patient if symptoms (urinary frequency) worsen and/or  unresolved please follow-up with PCP for further evaluation.     ED Prescriptions   None    PDMP not reviewed this encounter.   Trevor Iha, FNP 01/02/21 [phone]

## 2021-01-03 ENCOUNTER — Telehealth: Payer: BC Managed Care – PPO | Admitting: Sports Medicine

## 2021-01-09 DIAGNOSIS — F331 Major depressive disorder, recurrent, moderate: Secondary | ICD-10-CM | POA: Diagnosis not present

## 2021-01-09 DIAGNOSIS — F411 Generalized anxiety disorder: Secondary | ICD-10-CM | POA: Diagnosis not present

## 2021-02-03 ENCOUNTER — Other Ambulatory Visit: Payer: Self-pay | Admitting: Family Medicine

## 2021-02-04 NOTE — Telephone Encounter (Signed)
Routing to covering provider.  °

## 2021-02-06 DIAGNOSIS — F331 Major depressive disorder, recurrent, moderate: Secondary | ICD-10-CM | POA: Diagnosis not present

## 2021-02-06 DIAGNOSIS — F411 Generalized anxiety disorder: Secondary | ICD-10-CM | POA: Diagnosis not present

## 2021-03-24 ENCOUNTER — Ambulatory Visit: Payer: BC Managed Care – PPO | Admitting: Sports Medicine

## 2021-03-24 ENCOUNTER — Other Ambulatory Visit: Payer: Self-pay

## 2021-03-24 ENCOUNTER — Ambulatory Visit (INDEPENDENT_AMBULATORY_CARE_PROVIDER_SITE_OTHER): Payer: BC Managed Care – PPO

## 2021-03-24 DIAGNOSIS — M545 Low back pain, unspecified: Secondary | ICD-10-CM | POA: Insufficient documentation

## 2021-03-24 MED ORDER — CYCLOBENZAPRINE HCL 10 MG PO TABS: ORAL_TABLET | ORAL | 0 refills | Status: DC
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
## 2021-04-11 DIAGNOSIS — F3342 Major depressive disorder, recurrent, in full remission: Secondary | ICD-10-CM | POA: Diagnosis not present

## 2021-04-11 DIAGNOSIS — R7989 Other specified abnormal findings of blood chemistry: Secondary | ICD-10-CM | POA: Diagnosis not present

## 2021-04-11 DIAGNOSIS — F439 Reaction to severe stress, unspecified: Secondary | ICD-10-CM | POA: Diagnosis not present

## 2021-04-11 DIAGNOSIS — E039 Hypothyroidism, unspecified: Secondary | ICD-10-CM | POA: Diagnosis not present

## 2021-04-11 DIAGNOSIS — E119 Type 2 diabetes mellitus without complications: Secondary | ICD-10-CM | POA: Diagnosis not present

## 2021-04-21 ENCOUNTER — Ambulatory Visit: Payer: BC Managed Care – PPO | Admitting: Sports Medicine

## 2021-04-21 ENCOUNTER — Other Ambulatory Visit: Payer: Self-pay

## 2021-04-21 DIAGNOSIS — M545 Low back pain, unspecified: Secondary | ICD-10-CM | POA: Diagnosis not present

## 2021-04-21 NOTE — Progress Notes (Signed)
° ° °  Procedures performed today:    None.  Independent interpretation of notes and tests performed by another provider:   None.  Brief History, Exam, Impression, and Recommendations:    Acute left-sided low back pain without sciatica This is a pleasant 56 year old male, he is following up for left-sided low back pain with radiation to the left buttock but not past the thigh or knee. He has been treated conservatively with physician directed conservative treatment, including physical therapy, muscle relaxers. Unfortunately continues to have discomfort, we will proceed with MRI, he can follow-up with me in a virtual visit for MRI results.  For insurance purposes he is having pain with progressive weakness left lower extremity, L4.    ___________________________________________ [name]. [name], M.D., ABFM., CAQSM. Primary Care and Sports Medicine [facility]  Adjunct Instructor of Family Medicine  [employer]

## 2021-04-21 NOTE — Assessment & Plan Note (Addendum)
This is a pleasant 56 year old male, he is following up for left-sided low back pain with radiation to the left buttock but not past the thigh or knee. He has been treated conservatively with physician directed conservative treatment, including physical therapy, muscle relaxers. Unfortunately continues to have discomfort, we will proceed with MRI, he can follow-up with me in a virtual visit for MRI results.  For insurance purposes he is having pain with progressive weakness left lower extremity, L4.

## 2021-04-26 ENCOUNTER — Other Ambulatory Visit: Payer: Self-pay

## 2021-04-26 ENCOUNTER — Ambulatory Visit (INDEPENDENT_AMBULATORY_CARE_PROVIDER_SITE_OTHER): Payer: BC Managed Care – PPO

## 2021-04-26 DIAGNOSIS — M545 Low back pain, unspecified: Secondary | ICD-10-CM

## 2021-05-01 DIAGNOSIS — E7210 Disorders of sulfur-bearing amino-acid metabolism, unspecified: Secondary | ICD-10-CM | POA: Diagnosis not present

## 2021-05-01 DIAGNOSIS — E039 Hypothyroidism, unspecified: Secondary | ICD-10-CM | POA: Diagnosis not present

## 2021-05-01 DIAGNOSIS — E119 Type 2 diabetes mellitus without complications: Secondary | ICD-10-CM | POA: Diagnosis not present

## 2021-05-01 DIAGNOSIS — R7989 Other specified abnormal findings of blood chemistry: Secondary | ICD-10-CM | POA: Diagnosis not present

## 2021-05-05 ENCOUNTER — Ambulatory Visit: Payer: BC Managed Care – PPO | Admitting: Family Medicine

## 2021-05-15 ENCOUNTER — Other Ambulatory Visit: Payer: Self-pay

## 2021-05-15 ENCOUNTER — Ambulatory Visit: Payer: BC Managed Care – PPO | Admitting: Family Medicine

## 2021-05-15 ENCOUNTER — Encounter: Payer: Self-pay | Admitting: Family Medicine

## 2021-05-15 VITALS — BP 120/71 | HR 66 | Ht 68.0 in | Wt 220.0 lb

## 2021-05-15 DIAGNOSIS — Z23 Encounter for immunization: Secondary | ICD-10-CM

## 2021-05-15 DIAGNOSIS — G43019 Migraine without aura, intractable, without status migrainosus: Secondary | ICD-10-CM

## 2021-05-15 DIAGNOSIS — F902 Attention-deficit hyperactivity disorder, combined type: Secondary | ICD-10-CM

## 2021-05-15 DIAGNOSIS — R7989 Other specified abnormal findings of blood chemistry: Secondary | ICD-10-CM | POA: Diagnosis not present

## 2021-05-15 DIAGNOSIS — E039 Hypothyroidism, unspecified: Secondary | ICD-10-CM | POA: Diagnosis not present

## 2021-05-15 DIAGNOSIS — F411 Generalized anxiety disorder: Secondary | ICD-10-CM | POA: Diagnosis not present

## 2021-05-15 MED ORDER — DEXMETHYLPHENIDATE HCL ER 15 MG PO CP24
15.0000 mg | ORAL_CAPSULE | Freq: Every day | ORAL | 0 refills | Status: DC
Start: 2021-05-15 — End: 2021-10-30

## 2021-05-15 MED ORDER — DIAZEPAM 10 MG PO TABS: ORAL_TABLET | ORAL | 0 refills | Status: DC

## 2021-05-15 NOTE — Progress Notes (Signed)
Erroneous error

## 2021-05-16 ENCOUNTER — Telehealth: Payer: Self-pay

## 2021-05-16 NOTE — Telephone Encounter (Addendum)
Initiated Prior authorization ERX:[id number] HCl ER 15MG  er capsules ?Via: Covermymeds ?Case/Key:[id number] ?Status:Approved  as of 05/16/21 ?Reason:Coverage Starts on: 05/16/2021 12:00:00 AM, Coverage Ends on: 05/16/2022 12:00:00 AM ?Notified Pt via: Mychart ?

## 2021-05-18 NOTE — Assessment & Plan Note (Signed)
Management per integrative health.  Reports levels have been stable with current medication. ?

## 2021-05-18 NOTE — Assessment & Plan Note (Signed)
Symptoms remain well controlled with Focalin.  We will continue at current strength. ?

## 2021-05-18 NOTE — Assessment & Plan Note (Signed)
Currently taking NP thyroid, managed by integrative medicine. ?

## 2021-05-18 NOTE — Assessment & Plan Note (Signed)
He does use these occasionally to help with sleep.  No signs of overuse or abuse.  We will continue for now. ?

## 2021-05-18 NOTE — Assessment & Plan Note (Signed)
Aimovig continues to work well for him.  Recommend continuation. ?

## 2021-05-18 NOTE — Progress Notes (Signed)
?Scott Fields - 56 y.o. male MRN [id number]  Date of birth: 01-12-1966 ? ?Subjective ?Chief Complaint  ?Patient presents with  ? Follow-up  ? ? ?HPI ?Scott [name] a 56 year old male here today for follow-up visit.  Reports overall he is feeling well.  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 ? ?As far as medication goes he does feel that Focalin continues to work well for him.  He has not experienced any side effects related to this. ? ?He does take diazepam occasionally as needed for anxiety and sleep. ? ?He does continue to see integrative medicine and they are managing his testosterone and and NP thyroid.  He does report that his integrative practitioner is concerned about "adrenal fatigue" due to his stress level. ? ?ROS:  A comprehensive ROS was completed and negative except as noted per HPI ? ? ? ?Allergies  ?Allergen Reactions  ? Naproxen Nausea And Vomiting  ? ? ?Past Medical History:  ?Diagnosis Date  ? ADHD (attention deficit hyperactivity disorder)   ? Allergy   ? Appendicitis 1998  ? Arthritis   ? Asthma   ? Exercise Induced  ? Fracture of wrist 1984  ? Fracture, maxillary (HCC) 2009  ? GERD (gastroesophageal reflux disease)   ? Hyperlipidemia   ? Low testosterone 09/15/2017  ? PONV (postoperative nausea and vomiting)   ? Prediabetes 09/15/2017  ? Sleep apnea   ? Staph infection 2000 and 2002  ? staph infections post surgeries appe and vasectomy  ? Thyroid disease   ? ? ?Past Surgical History:  ?Procedure Laterality Date  ? APPENDECTOMY    ? CHONDROPLASTY Right 12/17/2016  ? Procedure: CHONDROPLASTY;  Surgeon: Loreta Ave, MD;  Location: [facility];  Service: Orthopedics;  Laterality: Right;  ? KNEE ARTHROSCOPY Right 12/17/2016  ? Procedure: KNEE ARTHROSCOPY, REMOVAL OF LOOSE BODIES;  Surgeon: Loreta Ave, MD;  Location: [facility];  Service: Orthopedics;  Laterality: Right;  ? ? ?Social History   ? ?Socioeconomic History  ? Marital status: Married  ?  Spouse name: Not on file  ? Number of children: Not on file  ? Years of education: Not on file  ? Highest education level: Not on file  ?Occupational History  ? Not on file  ?Tobacco Use  ? Smoking status: Never  ? Smokeless tobacco: Never  ?Vaping Use  ? Vaping Use: Never used  ?Substance and Sexual Activity  ? Alcohol use: Not Currently  ?  Alcohol/week: 1.0 standard drink  ?  Types: 1 Glasses of wine per week  ?  Comment: every several months  ? Drug use: No  ? Sexual activity: Yes  ?  Partners: Female  ?Other Topics Concern  ? Not on file  ?Social History Narrative  ? Not on file  ? ?Social Determinants of Health  ? ?Financial Resource Strain: Not on file  ?Food Insecurity: Not on file  ?Transportation Needs: Not on file  ?Physical Activity: Not on file  ?Stress: Not on file  ?Social Connections: Not on file  ? ? ?Family History  ?Problem Relation Age of Onset  ? Diabetes Mother   ? Hypertension Mother   ? Heart attack Father   ? Emphysema Father   ? Heart failure Father   ? Alcohol abuse Maternal Aunt   ? Alcohol abuse Maternal Uncle   ? Colon cancer Neg Hx   ? Esophageal cancer Neg  Hx   ? Rectal cancer Neg Hx   ? Stomach cancer Neg Hx   ? ? ?Health Maintenance  ?Topic Date Due  ? Zoster Vaccines- Shingrix (2 of 2) 07/10/2021  ? COLONOSCOPY (Pts 45-36yrs Insurance coverage will need to be confirmed)  09/29/2028  ? TETANUS/TDAP  02/02/2030  ? INFLUENZA VACCINE  Completed  ? Hepatitis C Screening  Completed  ? HIV Screening  Completed  ? HPV VACCINES  Aged Out  ? COVID-19 Vaccine  Discontinued  ? ? ? ?----------------------------------------------------------------------------------------------------------------------------------------------------------------------------------------------------------------- ?Physical Exam ?BP 120/71 (BP Location: Left Arm, Patient Position: Sitting, Cuff Size: Large)   Pulse 66   Ht 5\' 8"  (1.727 m)   Wt 220 lb (99.8 kg)    SpO2 97%   BMI 33.45 kg/m?  ? ?Physical Exam ?Constitutional:   ?   Appearance: Normal appearance.  ?Eyes:  ?   General: No scleral icterus. ?Cardiovascular:  ?   Rate and Rhythm: Normal rate and regular rhythm.  ?Pulmonary:  ?   Effort: Pulmonary effort is normal.  ?   Breath sounds: Normal breath sounds.  ?Musculoskeletal:  ?   Cervical back: Neck supple.  ?Neurological:  ?   Mental Status: He is alert.  ?Psychiatric:     ?   Mood and Affect: Mood normal.     ?   Behavior: Behavior normal.  ? ? ?------------------------------------------------------------------------------------------------------------------------------------------------------------------------------------------------------------------- ?Assessment and Plan ? ?GAD (generalized anxiety disorder) ?He does use these occasionally to help with sleep.  No signs of overuse or abuse.  We will continue for now. ? ?Low testosterone ?Management per integrative health.  Reports levels have been stable with current medication. ? ?Adult hypothyroidism ?Currently taking NP thyroid, managed by integrative medicine. ? ?Headache, migraine ?Aimovig continues to work well for him.  Recommend continuation. ? ?Attention deficit hyperactivity disorder ?Symptoms remain well controlled with Focalin.  We will continue at current strength. ? ? ?Meds ordered this encounter  ?Medications  ? dexmethylphenidate (FOCALIN XR) 15 MG 24 hr capsule  ?  Sig: Take 1 capsule (15 mg total) by mouth daily.  ?  Dispense:  60 capsule  ?  Refill:  0  ? diazepam (VALIUM) 10 MG tablet  ?  Sig: TAKE 1 TABLET BY MOUTH AT BEDTIME AS NEEDED FOR ANXIETY  ?  Dispense:  30 tablet  ?  Refill:  0  ?  This request is for a new prescription for a controlled substance as required by Federal/State law.  ? ? ?Return in about 6 months (around 11/15/2021) for ADHD. ? ? ? ?This visit occurred during the SARS-CoV-2 public health emergency.  Safety protocols were in place, including screening questions prior  to the visit, additional usage of staff PPE, and extensive cleaning of exam room while observing appropriate contact time as indicated for disinfecting solutions.  ? ?

## 2021-05-26 ENCOUNTER — Emergency Department (INDEPENDENT_AMBULATORY_CARE_PROVIDER_SITE_OTHER): Payer: BC Managed Care – PPO

## 2021-05-26 ENCOUNTER — Emergency Department (INDEPENDENT_AMBULATORY_CARE_PROVIDER_SITE_OTHER)
Admission: EM | Admit: 2021-05-26 | Discharge: 2021-05-26 | Disposition: A | Discharge status: Home / Self Care | Payer: BC Managed Care – PPO | Source: Home / Self Care

## 2021-05-26 DIAGNOSIS — R1013 Epigastric pain: Secondary | ICD-10-CM

## 2021-05-26 DIAGNOSIS — R109 Unspecified abdominal pain: Secondary | ICD-10-CM | POA: Diagnosis not present

## 2021-05-26 DIAGNOSIS — K5900 Constipation, unspecified: Secondary | ICD-10-CM

## 2021-05-26 MED ORDER — MAGNESIUM CITRATE PO SOLN: ORAL | 0 refills | Status: DC

## 2021-05-26 NOTE — ED Triage Notes (Signed)
Pt states that he has some upper abdominal pain. Pt states that sometimes it radiates to the right side. X4 days ?

## 2021-05-26 NOTE — Discharge Instructions (Addendum)
Advised/informed patient of KUB x-ray results today with hard copy provided to patient.  Advised/instructed patient to take mag citrate as directed for the next 3 days.  Afterwards encouraged patient to increase daily fiber intake to 40 to 50 mg/day as well as increasing daily water intake to 1.5 L/day.  Advised patient if symptoms worsen and/or unresolved please follow-up with PCP or here for further evaluation. ?

## 2021-05-26 NOTE — ED Provider Notes (Signed)
?[facility] ? ? ? ?CSN: [id number] ?Arrival date & time: 05/26/21  0802 ? ? ?  ? ?History   ?Chief Complaint ?Chief Complaint  ?Patient presents with  ? Abdominal Pain  ?  Abdominal pain. X4 days  ? ? ?HPI ?Scott [name] a 56 y.o. male.  ? ?HPI 56 year old male presents with central upper abdominal/epigastric pain that radiates to right side for 4 days.  Patient reports pain when drinking water.  PMH significant for obesity, ADHD, history of appendicitis, and sleep apnea. ? ?Past Medical History:  ?Diagnosis Date  ? ADHD (attention deficit hyperactivity disorder)   ? Allergy   ? Appendicitis 1998  ? Arthritis   ? Asthma   ? Exercise Induced  ? Fracture of wrist 1984  ? Fracture, maxillary (HCC) 2009  ? GERD (gastroesophageal reflux disease)   ? Hyperlipidemia   ? Low testosterone 09/15/2017  ? PONV (postoperative nausea and vomiting)   ? Prediabetes 09/15/2017  ? Sleep apnea   ? Staph infection 2000 and 2002  ? staph infections post surgeries appe and vasectomy  ? Thyroid disease   ? ? ?Patient Active Problem List  ? Diagnosis Date Noted  ? Acute left-sided low back pain without sciatica 03/24/2021  ? Contact dermatitis 12/03/2020  ? GAD (generalized anxiety disorder) 04/22/2020  ? Prediabetes 09/15/2017  ? Low testosterone 09/15/2017  ? Chondromalacia, patella, left 10/07/2016  ? Seborrheic dermatitis 12/16/2015  ? Headache, migraine 08/06/2015  ? Exercise-induced asthma 08/06/2015  ? Attention deficit hyperactivity disorder 07/02/2011  ? HLD (hyperlipidemia) 02/20/2011  ? Avitaminosis D 02/20/2011  ? OSA (obstructive sleep apnea) 12/10/2010  ? Adult hypothyroidism 10/29/2010  ? ? ?Past Surgical History:  ?Procedure Laterality Date  ? APPENDECTOMY    ? CHONDROPLASTY Right 12/17/2016  ? Procedure: CHONDROPLASTY;  Surgeon: Loreta Ave, MD;  Location: [facility];  Service: Orthopedics;  Laterality: Right;  ? KNEE ARTHROSCOPY Right 12/17/2016  ? Procedure: KNEE ARTHROSCOPY, REMOVAL OF  LOOSE BODIES;  Surgeon: Loreta Ave, MD;  Location: [facility];  Service: Orthopedics;  Laterality: Right;  ? ? ? ? ? ?Home Medications   ? ?Prior to Admission medications   ?Medication Sig Start Date End Date Taking? Authorizing Provider  ?albuterol (PROAIR HFA) 108 (90 Base) MCG/ACT inhaler Inhale 2 puffs into the lungs every 6 (six) hours as needed for wheezing or shortness of breath. 11/05/20  Yes Everrett Coombe, DO  ?AMBULATORY NON FORMULARY MEDICATION Adjust continuous positive airway pressure (CPAP) machine to following settings  ?auto-titrate from 8-20 cm of H2O pressure. ?Send to [facility] [place].  Fax [phone] phone: [phone] 12/15/18  Yes Rodolph Bong, MD  ?cholecalciferol (VITAMIN D) 1000 units tablet Take 4,000 Units by mouth daily.   Yes [provider]  ?dexmethylphenidate (FOCALIN XR) 15 MG 24 hr capsule Take 1 capsule (15 mg total) by mouth daily. 05/15/21  Yes Everrett Coombe, DO  ?diazepam (VALIUM) 10 MG tablet TAKE 1 TABLET BY MOUTH AT BEDTIME AS NEEDED FOR ANXIETY 05/15/21  Yes Everrett Coombe, DO  ?Erenumab-aooe (AIMOVIG) 140 MG/ML SOAJ Inject 140 mg into the skin every 30 (thirty) days. 05/07/20  Yes Everrett Coombe, DO  ?MAGNESIUM CITRATE PO Take 250 mg by mouth.   Yes [provider]  ?magnesium citrate SOLN Take 150 mL p.o. twice daily for the next 3 days. 05/26/21  Yes Trevor Iha, FNP  ?NP THYROID 60 MG tablet Take by mouth. 03/28/20  Yes [provider]  ?  Omega-3 Fatty Acids (FISH OIL) 1000 MG CAPS Take 1,000 mg by mouth.   Yes [provider]  ?testosterone cypionate (DEPOTESTOSTERONE CYPIONATE) 200 MG/ML injection INJECT 0.4 MLS WEEKLY AS DIRECTED 05/20/18  Yes [provider]  ? ? ?Family History ?Family History  ?Problem Relation Age of Onset  ? Diabetes Mother   ? Hypertension Mother   ? Heart attack Father   ? Emphysema Father   ? Heart failure Father   ? Alcohol abuse Maternal Aunt   ? Alcohol abuse Maternal  Uncle   ? Colon cancer Neg Hx   ? Esophageal cancer Neg Hx   ? Rectal cancer Neg Hx   ? Stomach cancer Neg Hx   ? ? ?Social History ?Social History  ? ?Tobacco Use  ? Smoking status: Never  ? Smokeless tobacco: Never  ?Vaping Use  ? Vaping Use: Never used  ?Substance Use Topics  ? Alcohol use: Not Currently  ?  Alcohol/week: 1.0 standard drink  ?  Types: 1 Glasses of wine per week  ?  Comment: every several months  ? Drug use: No  ? ? ? ?Allergies   ?Naproxen ? ? ?Review of Systems ?Review of Systems  ?Gastrointestinal:  Positive for abdominal pain.  ? ? ?Physical Exam ?Triage Vital Signs ?ED Triage Vitals  ?Enc Vitals Group  ?   BP 05/26/21 0821 (!) 148/84  ?   Pulse Rate 05/26/21 0821 68  ?   Resp 05/26/21 0821 18  ?   Temp 05/26/21 0821 97.9 ?F (36.6 ?C)  ?   Temp Source 05/26/21 0821 Oral  ?   SpO2 05/26/21 0821 97 %  ?   Weight 05/26/21 0818 220 lb (99.8 kg)  ?   Height 05/26/21 0818 5\' 8"  (1.727 m)  ?   Head Circumference --   ?   Peak Flow --   ?   Pain Score 05/26/21 0818 4  ?   Pain Loc --   ?   Pain Edu? --   ?   Excl. in GC? --   ? ?No data found. ? ?Updated Vital Signs ?BP (!) 148/84 (BP Location: Left Arm)   Pulse 68   Temp 97.9 ?F (36.6 ?C) (Oral)   Resp 18   Ht 5\' 8"  (1.727 m)   Wt 220 lb (99.8 kg)   SpO2 97%   BMI 33.45 kg/m?  ? ?   ? ?Physical Exam ?Vitals and nursing note reviewed.  ?Constitutional:   ?   General: He is not in acute distress. ?   Appearance: He is well-developed. He is obese. He is not ill-appearing.  ?HENT:  ?   Head: Normocephalic and atraumatic.  ?   Mouth/Throat:  ?   Mouth: Mucous membranes are moist.  ?   Pharynx: Oropharynx is clear.  ?Eyes:  ?   Extraocular Movements: Extraocular movements intact.  ?   Pupils: Pupils are equal, round, and reactive to light.  ?Cardiovascular:  ?   Rate and Rhythm: Normal rate and regular rhythm.  ?   Pulses: Normal pulses.  ?   Heart sounds: Normal heart sounds. No murmur heard. ?Pulmonary:  ?   Effort: Pulmonary effort is normal.  ?    Breath sounds: Normal breath sounds.  ?Abdominal:  ?   General: Abdomen is flat. Bowel sounds are normal. There is no distension or abdominal bruit.  ?   Palpations: Abdomen is soft. There is no shifting dullness, fluid wave, hepatomegaly, splenomegaly, mass  or pulsatile mass.  ?   Tenderness: There is generalized abdominal tenderness and tenderness in the right upper quadrant, epigastric area and left upper quadrant. There is no right CVA tenderness, left CVA tenderness, guarding or rebound. Negative signs include Murphy's sign, McBurney's sign and psoas sign.  ?Musculoskeletal:  ?   Cervical back: Normal range of motion and neck supple.  ?Skin: ?   General: Skin is warm and dry.  ?Neurological:  ?   General: No focal deficit present.  ?   Mental Status: He is alert. He is disoriented.  ? ? ? ?UC Treatments / Results  ?Labs ?(all labs ordered are listed, but only abnormal results are displayed) ?Labs Reviewed - No data to display ? ?EKG ? ? ?Radiology ?DG Abdomen 1 View ? ?Result Date: 05/26/2021 ?CLINICAL DATA:  Epigastric pain, abdominal pain EXAM: ABDOMEN - 1 VIEW COMPARISON:  None. FINDINGS: The bowel gas pattern is normal. Large amount of retained colonic stool suggesting constipation. No radio-opaque calculi or other significant radiographic abnormality are seen. IMPRESSION: Large amount of retained colonic stool suggesting constipation. Electronically Signed   By: Larose Hires D.O.   On: 05/26/2021 09:15   ? ?Procedures ?Procedures (including critical care time) ? ?Medications Ordered in UC ?Medications - No data to display ? ?Initial Impression / Assessment and Plan / UC Course  ?I have reviewed the triage vital signs and the nursing notes. ? ?Pertinent labs & imaging results that were available during my care of the patient were reviewed by me and considered in my medical decision making (see chart for details). ? ?  ? ?MDM: 1.  Epigastric pain-KUB revealed above; 2.  Constipation, unspecified constipation  type. Advised/informed patient of KUB x-ray results today with hard copy provided to patient.  Advised/instructed patient to take mag citrate as directed for the next 3 days.  Afterwards encouraged patient to in

## 2021-05-27 ENCOUNTER — Encounter: Payer: Self-pay | Admitting: Family Medicine

## 2021-05-27 DIAGNOSIS — R109 Unspecified abdominal pain: Secondary | ICD-10-CM | POA: Diagnosis not present

## 2021-05-27 DIAGNOSIS — Z9089 Acquired absence of other organs: Secondary | ICD-10-CM | POA: Diagnosis not present

## 2021-05-27 DIAGNOSIS — G43909 Migraine, unspecified, not intractable, without status migrainosus: Secondary | ICD-10-CM | POA: Diagnosis not present

## 2021-05-27 DIAGNOSIS — R519 Headache, unspecified: Secondary | ICD-10-CM | POA: Diagnosis not present

## 2021-05-27 DIAGNOSIS — R1013 Epigastric pain: Secondary | ICD-10-CM | POA: Diagnosis not present

## 2021-05-27 DIAGNOSIS — K5900 Constipation, unspecified: Secondary | ICD-10-CM | POA: Diagnosis not present

## 2021-06-06 ENCOUNTER — Other Ambulatory Visit: Payer: Self-pay | Admitting: Family Medicine

## 2021-06-06 DIAGNOSIS — G43019 Migraine without aura, intractable, without status migrainosus: Secondary | ICD-10-CM

## 2021-06-11 ENCOUNTER — Encounter: Payer: BC Managed Care – PPO | Admitting: Physician Assistant

## 2021-06-11 DIAGNOSIS — G5601 Carpal tunnel syndrome, right upper limb: Secondary | ICD-10-CM | POA: Diagnosis not present

## 2021-06-11 DIAGNOSIS — R200 Anesthesia of skin: Secondary | ICD-10-CM | POA: Diagnosis not present

## 2021-06-11 DIAGNOSIS — M79631 Pain in right forearm: Secondary | ICD-10-CM | POA: Diagnosis not present

## 2021-06-11 NOTE — Progress Notes (Signed)
Patient cancelled visit. Is going for in-person evaluation now due to concern for possible DVT.  ?

## 2021-06-12 DIAGNOSIS — K5900 Constipation, unspecified: Secondary | ICD-10-CM | POA: Diagnosis not present

## 2021-06-12 DIAGNOSIS — R1013 Epigastric pain: Secondary | ICD-10-CM | POA: Diagnosis not present

## 2021-07-02 ENCOUNTER — Other Ambulatory Visit: Payer: Self-pay | Admitting: Sports Medicine

## 2021-07-02 NOTE — Telephone Encounter (Signed)
To PCP

## 2021-07-16 DIAGNOSIS — F331 Major depressive disorder, recurrent, moderate: Secondary | ICD-10-CM | POA: Diagnosis not present

## 2021-07-16 DIAGNOSIS — F411 Generalized anxiety disorder: Secondary | ICD-10-CM | POA: Diagnosis not present

## 2021-08-13 DIAGNOSIS — F411 Generalized anxiety disorder: Secondary | ICD-10-CM | POA: Diagnosis not present

## 2021-08-13 DIAGNOSIS — F331 Major depressive disorder, recurrent, moderate: Secondary | ICD-10-CM | POA: Diagnosis not present

## 2021-09-10 DIAGNOSIS — F411 Generalized anxiety disorder: Secondary | ICD-10-CM | POA: Diagnosis not present

## 2021-09-10 DIAGNOSIS — F331 Major depressive disorder, recurrent, moderate: Secondary | ICD-10-CM | POA: Diagnosis not present

## 2021-10-06 DIAGNOSIS — F331 Major depressive disorder, recurrent, moderate: Secondary | ICD-10-CM | POA: Diagnosis not present

## 2021-10-06 DIAGNOSIS — F411 Generalized anxiety disorder: Secondary | ICD-10-CM | POA: Diagnosis not present

## 2021-10-30 ENCOUNTER — Encounter: Payer: Self-pay | Admitting: Family Medicine

## 2021-10-30 MED ORDER — DEXMETHYLPHENIDATE HCL ER 15 MG PO CP24: 15.0000 mg | ORAL_CAPSULE | Freq: Every day | ORAL | 0 refills | Status: DC

## 2021-10-30 NOTE — Telephone Encounter (Signed)
Completed.

## 2021-11-03 DIAGNOSIS — F331 Major depressive disorder, recurrent, moderate: Secondary | ICD-10-CM | POA: Diagnosis not present

## 2021-11-03 DIAGNOSIS — F411 Generalized anxiety disorder: Secondary | ICD-10-CM | POA: Diagnosis not present

## 2021-11-14 DIAGNOSIS — E039 Hypothyroidism, unspecified: Secondary | ICD-10-CM | POA: Diagnosis not present

## 2021-11-14 DIAGNOSIS — E119 Type 2 diabetes mellitus without complications: Secondary | ICD-10-CM | POA: Diagnosis not present

## 2021-11-14 DIAGNOSIS — R7989 Other specified abnormal findings of blood chemistry: Secondary | ICD-10-CM | POA: Diagnosis not present

## 2021-11-25 ENCOUNTER — Ambulatory Visit: Payer: BC Managed Care – PPO | Admitting: Family Medicine

## 2021-12-15 DIAGNOSIS — F411 Generalized anxiety disorder: Secondary | ICD-10-CM | POA: Diagnosis not present

## 2021-12-15 DIAGNOSIS — F331 Major depressive disorder, recurrent, moderate: Secondary | ICD-10-CM | POA: Diagnosis not present

## 2021-12-16 ENCOUNTER — Encounter: Payer: Self-pay | Admitting: Family Medicine

## 2021-12-16 ENCOUNTER — Ambulatory Visit: Payer: BC Managed Care – PPO | Admitting: Family Medicine

## 2021-12-16 DIAGNOSIS — R7989 Other specified abnormal findings of blood chemistry: Secondary | ICD-10-CM | POA: Diagnosis not present

## 2021-12-16 DIAGNOSIS — G43019 Migraine without aura, intractable, without status migrainosus: Secondary | ICD-10-CM

## 2021-12-16 DIAGNOSIS — E039 Hypothyroidism, unspecified: Secondary | ICD-10-CM | POA: Diagnosis not present

## 2021-12-16 DIAGNOSIS — F411 Generalized anxiety disorder: Secondary | ICD-10-CM

## 2021-12-16 DIAGNOSIS — F902 Attention-deficit hyperactivity disorder, combined type: Secondary | ICD-10-CM

## 2021-12-16 MED ORDER — KETOROLAC TROMETHAMINE 30 MG/ML IJ SOLN
30.0000 mg | Freq: Once | INTRAMUSCULAR | Status: AC
  Administered 2021-12-16: 30 mg via INTRAMUSCULAR

## 2021-12-16 MED ORDER — DEXMETHYLPHENIDATE HCL ER 15 MG PO CP24
15.0000 mg | ORAL_CAPSULE | Freq: Every day | ORAL | 0 refills | Status: DC
Start: 2021-12-16 — End: 2022-01-19

## 2021-12-16 MED ORDER — DIAZEPAM 10 MG PO TABS: ORAL_TABLET | ORAL | 0 refills | Status: AC

## 2021-12-16 MED ORDER — NURTEC 75 MG PO TBDP
ORAL_TABLET | ORAL | 0 refills | Status: AC
Start: 2021-12-16 — End: ?

## 2021-12-16 MED ORDER — DEXAMETHASONE SODIUM PHOSPHATE 10 MG/ML IJ SOLN
10.0000 mg | Freq: Once | INTRAMUSCULAR | Status: AC
  Administered 2021-12-16: 10 mg via INTRAMUSCULAR

## 2021-12-16 NOTE — Addendum Note (Signed)
Addended by: Peggye Ley on: 12/16/2021 09:47 AM   Modules accepted: Orders

## 2021-12-16 NOTE — Assessment & Plan Note (Signed)
Management per integrative medicine.

## 2021-12-16 NOTE — Patient Instructions (Addendum)
[facility] is out of focalin.  Prescription has been sent to [facility] for this fill.   Let's try nurtec for management of acute migraine episodes.

## 2021-12-16 NOTE — Progress Notes (Signed)
Scott Fields - 56 y.o. male MRN [id number]  Date of birth: [birth year]  Subjective Chief Complaint  Patient presents with   Follow-up    HPI Scott [name] a 56 y.o. male here today for follow up visit.   He is having an acute migraine episode today.  Symptoms are typical of his normal migraines. Previously using aimovig for migraine prevention.  He does continue on magnesium supplementation.   Had flu about 1 week ago and thinks this may have triggered his migraine.   Using diazepam rarely for acute anxiety.   Continues to do well with focalin for management of ADHD.  No side effects related to this.   He is seeing integrative medicine as well.  They are managing NP thyroid and testosterone.  Reports that he feels pretty good with this.   ROS:  A comprehensive ROS was completed and negative except as noted per HPI  Allergies  Allergen Reactions   Naproxen Nausea And Vomiting    Past Medical History:  Diagnosis Date   ADHD (attention deficit hyperactivity disorder)    Allergy    Appendicitis 1998   Arthritis    Asthma    Exercise Induced   Fracture of wrist 1984   Fracture, maxillary ([place]) 2009   GERD (gastroesophageal reflux disease)    Hyperlipidemia    Low testosterone 09/15/2017   PONV (postoperative nausea and vomiting)    Prediabetes 09/15/2017   Sleep apnea    Staph infection 2000 and 2002   staph infections post surgeries appe and vasectomy   Thyroid disease     Past Surgical History:  Procedure Laterality Date   APPENDECTOMY     CHONDROPLASTY Right 12/17/2016   Procedure: CHONDROPLASTY;  Surgeon: Ninetta Lights, MD;  Location: [place];  Service: Orthopedics;  Laterality: Right;   KNEE ARTHROSCOPY Right 12/17/2016   Procedure: KNEE ARTHROSCOPY, REMOVAL OF LOOSE BODIES;  Surgeon: Ninetta Lights, MD;  Location: [place];  Service: Orthopedics;  Laterality: Right;    Social History   Socioeconomic History   Marital  status: Married    Spouse name: Not on file   Number of children: Not on file   Years of education: Not on file   Highest education level: Not on file  Occupational History   Not on file  Tobacco Use   Smoking status: Never   Smokeless tobacco: Never  Vaping Use   Vaping Use: Never used  Substance and Sexual Activity   Alcohol use: Not Currently    Alcohol/week: 1.0 standard drink of alcohol    Types: 1 Glasses of wine per week    Comment: every several months   Drug use: No   Sexual activity: Yes    Partners: Female  Other Topics Concern   Not on file  Social History Narrative   Not on file   Social Determinants of Health   Financial Resource Strain: Not on file  Food Insecurity: Not on file  Transportation Needs: Not on file  Physical Activity: Sufficiently Active (09/03/2017)   Exercise Vital Sign    Days of Exercise per Week: 5 days    Minutes of Exercise per Session: 60 min  Stress: Not on file  Social Connections: Not on file    Family History  Problem Relation Age of Onset   Diabetes Mother    Hypertension Mother    Heart attack Father    Emphysema Father    Heart failure Father  Alcohol abuse Maternal Aunt    Alcohol abuse Maternal Uncle    Colon cancer Neg Hx    Esophageal cancer Neg Hx    Rectal cancer Neg Hx    Stomach cancer Neg Hx     Health Maintenance  Topic Date Due   Zoster Vaccines- Shingrix (2 of 2) 07/10/2021   INFLUENZA VACCINE  09/23/2021   COLONOSCOPY (Pts 45-4yrs Insurance coverage will need to be confirmed)  09/29/2028   TETANUS/TDAP  02/02/2030   Hepatitis C Screening  Completed   HIV Screening  Completed   HPV VACCINES  Aged Out   COVID-19 Vaccine  Discontinued     ----------------------------------------------------------------------------------------------------------------------------------------------------------------------------------------------------------------- Physical Exam BP 128/74 (BP Location: Left Arm,  Patient Position: Sitting, Cuff Size: Large)   Pulse (!) 58   Ht 5\' 8"  (1.727 m)   Wt 221 lb (100.2 kg)   SpO2 98%   BMI 33.60 kg/m   Physical Exam Constitutional:      Appearance: Normal appearance.  Eyes:     General: No scleral icterus. Cardiovascular:     Rate and Rhythm: Normal rate and regular rhythm.  Pulmonary:     Effort: Pulmonary effort is normal.     Breath sounds: Normal breath sounds.  Musculoskeletal:     Cervical back: Neck supple.  Neurological:     Mental Status: He is alert.  Psychiatric:        Mood and Affect: Mood normal.        Behavior: Behavior normal.     ------------------------------------------------------------------------------------------------------------------------------------------------------------------------------------------------------------------- Assessment and Plan  Headache, migraine Having acute migraine today.  Given injection of toradol and dexamethasone. Did not tolerate triptans in the past.  Adding nurtec as needed for acute migraine episodes.  Continue magnesium supplementation.   Adult hypothyroidism Seeing integrative medicine.  Currently on NP thyroid.    Attention deficit hyperactivity disorder Pharmacy currently out of focalin.  Sending to .  Will continue at current strength.    Low testosterone Management per integrative medicine.   GAD (generalized anxiety disorder) Rare use of diazepam.  Renewed.    Meds ordered this encounter  Medications   dexmethylphenidate (FOCALIN XR) 15 MG 24 hr capsule    Sig: Take 1 capsule (15 mg total) by mouth daily.    Dispense:  20 capsule    Refill:  0   diazepam (VALIUM) 10 MG tablet    Sig: TAKE 1 TABLET BY MOUTH AT BEDTIME AS NEEDED FOR ANXIETY    Dispense:  30 tablet    Refill:  0    Not to exceed 5 additional fills before 08/03/2021   Rimegepant Sulfate (NURTEC) 75 MG TBDP    Sig: Take 1 tab daily as needed for migraine.    Dispense:  10 tablet     Refill:  0    No follow-ups on file.    This visit occurred during the SARS-CoV-2 public health emergency.  Safety protocols were in place, including screening questions prior to the visit, additional usage of staff PPE, and extensive cleaning of exam room while observing appropriate contact time as indicated for disinfecting solutions.

## 2021-12-16 NOTE — Assessment & Plan Note (Signed)
Pharmacy currently out of focalin.  Sending to [facility].  Will continue at current strength.

## 2021-12-16 NOTE — Assessment & Plan Note (Signed)
Rare use of diazepam.  Renewed.

## 2021-12-16 NOTE — Assessment & Plan Note (Signed)
Having acute migraine today.  Given injection of toradol and dexamethasone. Did not tolerate triptans in the past.  Adding nurtec as needed for acute migraine episodes.  Continue magnesium supplementation.

## 2021-12-16 NOTE — Assessment & Plan Note (Signed)
Seeing integrative medicine.  Currently on NP thyroid.

## 2021-12-19 DIAGNOSIS — J019 Acute sinusitis, unspecified: Secondary | ICD-10-CM | POA: Diagnosis not present

## 2021-12-19 DIAGNOSIS — R051 Acute cough: Secondary | ICD-10-CM | POA: Diagnosis not present

## 2021-12-24 ENCOUNTER — Ambulatory Visit: Payer: BC Managed Care – PPO

## 2022-01-05 ENCOUNTER — Ambulatory Visit (INDEPENDENT_AMBULATORY_CARE_PROVIDER_SITE_OTHER): Payer: BC Managed Care – PPO | Admitting: Family Medicine

## 2022-01-05 VITALS — BP 121/68 | HR 66 | Ht 68.0 in

## 2022-01-05 DIAGNOSIS — Z23 Encounter for immunization: Secondary | ICD-10-CM

## 2022-01-05 NOTE — Progress Notes (Signed)
   Established Patient Office Visit  Subjective   Patient ID: Scott Fields, male    DOB: 1965-12-01  Age: 56 y.o. MRN: [id number]  Chief Complaint  Patient presents with   shingles vaccine    HPI  Shingles vaccine- pt here for second shingles vaccine.   ROS    Objective:     BP 121/68   Pulse 66   Ht 5\' 8"  (1.727 m)   SpO2 100%   BMI 33.60 kg/m    Physical Exam   No results found for any visits on 01/05/22.    The 10-year ASCVD risk score (Arnett DK, et al., 2019) is: 6.6%* (Cholesterol units were assumed)    Assessment & Plan:  2nd shingles vaccine- administered injection-patient tolereated injection well without complications.  Problem List Items Addressed This Visit   None Visit Diagnoses     Need for shingles vaccine    -  Primary   Relevant Orders   Varicella-zoster vaccine IM (Completed)       No follow-ups on file.    2020, LPN

## 2022-01-05 NOTE — Progress Notes (Signed)
Medical screening examination/treatment was performed by qualified clinical staff member and as supervising physician I was immediately available for consultation/collaboration. I have reviewed documentation and agree with assessment and plan.  Kween Bacorn, DO  

## 2022-01-08 DIAGNOSIS — E119 Type 2 diabetes mellitus without complications: Secondary | ICD-10-CM | POA: Diagnosis not present

## 2022-01-08 DIAGNOSIS — F3342 Major depressive disorder, recurrent, in full remission: Secondary | ICD-10-CM | POA: Diagnosis not present

## 2022-01-08 DIAGNOSIS — R7989 Other specified abnormal findings of blood chemistry: Secondary | ICD-10-CM | POA: Diagnosis not present

## 2022-01-12 DIAGNOSIS — F331 Major depressive disorder, recurrent, moderate: Secondary | ICD-10-CM | POA: Diagnosis not present

## 2022-01-12 DIAGNOSIS — F411 Generalized anxiety disorder: Secondary | ICD-10-CM | POA: Diagnosis not present

## 2022-01-19 ENCOUNTER — Other Ambulatory Visit: Payer: Self-pay | Admitting: Family Medicine

## 2022-01-19 MED ORDER — DEXMETHYLPHENIDATE HCL ER 15 MG PO CP24: 15.0000 mg | ORAL_CAPSULE | Freq: Every day | ORAL | 0 refills | Status: DC

## 2022-01-19 NOTE — Telephone Encounter (Signed)
Last OV: 12/16/21 Next OV: no appt scheduled Last RF: 12/16/21

## 2022-03-04 DIAGNOSIS — F331 Major depressive disorder, recurrent, moderate: Secondary | ICD-10-CM | POA: Diagnosis not present

## 2022-03-04 DIAGNOSIS — F411 Generalized anxiety disorder: Secondary | ICD-10-CM | POA: Diagnosis not present

## 2022-03-19 ENCOUNTER — Other Ambulatory Visit: Payer: Self-pay | Admitting: Family Medicine

## 2022-03-20 MED ORDER — DEXMETHYLPHENIDATE HCL ER 15 MG PO CP24: 15.0000 mg | ORAL_CAPSULE | Freq: Every day | ORAL | 0 refills | Status: DC

## 2022-03-24 DIAGNOSIS — H6123 Impacted cerumen, bilateral: Secondary | ICD-10-CM | POA: Diagnosis not present

## 2022-04-01 DIAGNOSIS — F411 Generalized anxiety disorder: Secondary | ICD-10-CM | POA: Diagnosis not present

## 2022-04-29 DIAGNOSIS — F331 Major depressive disorder, recurrent, moderate: Secondary | ICD-10-CM | POA: Diagnosis not present

## 2022-04-29 DIAGNOSIS — F411 Generalized anxiety disorder: Secondary | ICD-10-CM | POA: Diagnosis not present

## 2022-05-06 ENCOUNTER — Other Ambulatory Visit: Payer: Self-pay | Admitting: Medical-Surgical

## 2022-05-07 MED ORDER — DEXMETHYLPHENIDATE HCL ER 15 MG PO CP24: 15.0000 mg | ORAL_CAPSULE | Freq: Every day | ORAL | 0 refills | Status: DC

## 2022-05-15 DIAGNOSIS — R7303 Prediabetes: Secondary | ICD-10-CM | POA: Diagnosis not present

## 2022-05-15 DIAGNOSIS — E039 Hypothyroidism, unspecified: Secondary | ICD-10-CM | POA: Diagnosis not present

## 2022-05-15 DIAGNOSIS — F3342 Major depressive disorder, recurrent, in full remission: Secondary | ICD-10-CM | POA: Diagnosis not present

## 2022-05-15 DIAGNOSIS — E7210 Disorders of sulfur-bearing amino-acid metabolism, unspecified: Secondary | ICD-10-CM | POA: Diagnosis not present

## 2022-05-15 DIAGNOSIS — E119 Type 2 diabetes mellitus without complications: Secondary | ICD-10-CM | POA: Diagnosis not present

## 2022-05-15 DIAGNOSIS — R5383 Other fatigue: Secondary | ICD-10-CM | POA: Diagnosis not present

## 2022-05-15 DIAGNOSIS — Z125 Encounter for screening for malignant neoplasm of prostate: Secondary | ICD-10-CM | POA: Diagnosis not present

## 2022-05-27 DIAGNOSIS — F331 Major depressive disorder, recurrent, moderate: Secondary | ICD-10-CM | POA: Diagnosis not present

## 2022-05-27 DIAGNOSIS — F411 Generalized anxiety disorder: Secondary | ICD-10-CM | POA: Diagnosis not present

## 2022-06-01 DIAGNOSIS — E119 Type 2 diabetes mellitus without complications: Secondary | ICD-10-CM | POA: Diagnosis not present

## 2022-06-01 DIAGNOSIS — F3342 Major depressive disorder, recurrent, in full remission: Secondary | ICD-10-CM | POA: Diagnosis not present

## 2022-06-01 DIAGNOSIS — R7989 Other specified abnormal findings of blood chemistry: Secondary | ICD-10-CM | POA: Diagnosis not present

## 2022-06-01 DIAGNOSIS — E039 Hypothyroidism, unspecified: Secondary | ICD-10-CM | POA: Diagnosis not present

## 2022-06-24 DIAGNOSIS — F331 Major depressive disorder, recurrent, moderate: Secondary | ICD-10-CM | POA: Diagnosis not present

## 2022-06-24 DIAGNOSIS — F411 Generalized anxiety disorder: Secondary | ICD-10-CM | POA: Diagnosis not present

## 2022-07-02 ENCOUNTER — Encounter: Payer: Self-pay | Admitting: Family Medicine

## 2022-07-03 MED ORDER — FOCALIN XR 20 MG PO CP24: 20.0000 mg | ORAL_CAPSULE | Freq: Every day | ORAL | 0 refills | Status: DC

## 2022-07-06 ENCOUNTER — Ambulatory Visit: Payer: BC Managed Care – PPO | Admitting: Sports Medicine

## 2022-07-06 ENCOUNTER — Encounter: Payer: Self-pay | Admitting: Sports Medicine

## 2022-07-06 VITALS — BP 134/87 | HR 91 | Ht 68.0 in | Wt 226.0 lb

## 2022-07-06 DIAGNOSIS — R7303 Prediabetes: Secondary | ICD-10-CM

## 2022-07-06 DIAGNOSIS — N419 Inflammatory disease of prostate, unspecified: Secondary | ICD-10-CM

## 2022-07-06 DIAGNOSIS — R350 Frequency of micturition: Secondary | ICD-10-CM | POA: Diagnosis not present

## 2022-07-06 LAB — POCT URINALYSIS DIP (CLINITEK)
Bilirubin, UA: NEGATIVE
Blood, UA: NEGATIVE
Glucose, UA: NEGATIVE mg/dL
Ketones, POC UA: NEGATIVE mg/dL
Leukocytes, UA: NEGATIVE
Nitrite, UA: NEGATIVE
POC PROTEIN,UA: NEGATIVE
Spec Grav, UA: 1.01 (ref 1.010–1.025)
Urobilinogen, UA: 0.2 E.U./dL
pH, UA: 6.5 (ref 5.0–8.0)

## 2022-07-06 MED ORDER — CEFTRIAXONE SODIUM 1 G IJ SOLR
1.0000 g | INTRAMUSCULAR | Status: DC
  Administered 2022-07-06: 1 g via INTRAMUSCULAR

## 2022-07-06 MED ORDER — CIPROFLOXACIN HCL 750 MG PO TABS: 750.0000 mg | ORAL_TABLET | Freq: Two times a day (BID) | ORAL | 0 refills | Status: AC

## 2022-07-06 NOTE — Assessment & Plan Note (Addendum)
Pleasant 57 year old male, several days of malaise, increasing urinary obstruction with hesitancy, dribbling, weak stream, incomplete emptying. He had a low-grade fever yesterday. Headaches, fatigue today. He does endorse some pain in the perineum. Abdominal exam is not well, we will avoid rectal exam today. Urinalysis is normal but this is not uncommon in prostate infections. Heart rate is 91, considering all of the above this may be an early urosepsis. Adding CBC, CMP, PSA, blood cultures. Adding urine culture to the urine that we already have. Aggressive hydration. High-dose Cipro, Rocephin 1 g intramuscular today. I would like to see him back next week, low threshold for hospitalization, warning signs given including progressive weakness, palpitations, persistent fevers.

## 2022-07-06 NOTE — Progress Notes (Signed)
    Procedures performed today:    None.  Independent interpretation of notes and tests performed by another provider:   None.  Brief History, Exam, Impression, and Recommendations:    Prostatitis Pleasant 57 year old male, several days of malaise, increasing urinary obstruction with hesitancy, dribbling, weak stream, incomplete emptying. He had a low-grade fever yesterday. Headaches, fatigue today. He does endorse some pain in the perineum. Abdominal exam is not well, we will avoid rectal exam today. Urinalysis is normal but this is not uncommon in prostate infections. Heart rate is 91, considering all of the above this may be an early urosepsis. Adding CBC, CMP, PSA, blood cultures. Adding urine culture to the urine that we already have. Aggressive hydration. High-dose Cipro, Rocephin 1 g intramuscular today. I would like to see him back next week, low threshold for hospitalization, warning signs given including progressive weakness, palpitations, persistent fevers.  We considered hospitalization today.  ____________________________________________ [name]. [name], M.D., ABFM., CAQSM., AME. Primary Care and Sports Medicine [facility]  Adjunct Professor of Family Medicine  [employer]  Restaurant manager, fast food

## 2022-07-07 NOTE — Assessment & Plan Note (Signed)
A1c has been elevated up to 7.1% in the past, somewhat hyperglycemic on the CMP, adding a repeat A1c.

## 2022-07-07 NOTE — Addendum Note (Signed)
Addended by: Monica Becton on: 07/07/2022 08:13 AM   Modules accepted: Orders

## 2022-07-08 ENCOUNTER — Encounter: Payer: Self-pay | Admitting: Sports Medicine

## 2022-07-08 LAB — PSA, TOTAL AND FREE
PSA, % Free: 8 % (calc) — ABNORMAL LOW (ref >25)
PSA, Free: 0.1 ng/mL
PSA, Total: 1.2 ng/mL (ref <=4.0)

## 2022-07-10 LAB — CULTURE, BLOOD (SINGLE)

## 2022-07-10 LAB — URINE CULTURE
MICRO NUMBER:: 14948444
Result:: NO GROWTH
SPECIMEN QUALITY:: ADEQUATE

## 2022-07-10 LAB — PSA, TOTAL AND FREE

## 2022-07-11 LAB — CBC WITH DIFFERENTIAL/PLATELET
Absolute Monocytes: 541 cells/uL (ref 200–950)
Basophils Absolute: 40 cells/uL (ref 0–200)
Basophils Relative: 0.6 %
Eosinophils Absolute: 158 cells/uL (ref 15–500)
Eosinophils Relative: 2.4 %
HCT: 49.5 % (ref 38.5–50.0)
Hemoglobin: 16.5 g/dL (ref 13.2–17.1)
Lymphs Abs: 1888 cells/uL (ref 850–3900)
MCH: 29.9 pg (ref 27.0–33.0)
MCHC: 33.3 g/dL (ref 32.0–36.0)
MCV: 89.7 fL (ref 80.0–100.0)
MPV: 10.2 fL (ref 7.5–12.5)
Monocytes Relative: 8.2 %
Neutro Abs: 3973 cells/uL (ref 1500–7800)
Neutrophils Relative %: 60.2 %
Platelets: 224 10*3/uL (ref 140–400)
RBC: 5.52 10*6/uL (ref 4.20–5.80)
RDW: 12.4 % (ref 11.0–15.0)
Total Lymphocyte: 28.6 %
WBC: 6.6 10*3/uL (ref 3.8–10.8)

## 2022-07-11 LAB — CULTURE, BLOOD (SINGLE): MICRO NUMBER:: 14947565

## 2022-07-11 LAB — COMPREHENSIVE METABOLIC PANEL
AG Ratio: 1.8 (calc) (ref 1.0–2.5)
ALT: 25 U/L (ref 9–46)
AST: 19 U/L (ref 10–35)
Albumin: 4.6 g/dL (ref 3.6–5.1)
Alkaline phosphatase (APISO): 70 U/L (ref 35–144)
BUN: 15 mg/dL (ref 7–25)
CO2: 29 mmol/L (ref 20–32)
Calcium: 9.5 mg/dL (ref 8.6–10.3)
Chloride: 102 mmol/L (ref 98–110)
Creat: 1.24 mg/dL (ref 0.70–1.30)
Globulin: 2.5 g/dL (calc) (ref 1.9–3.7)
Glucose, Bld: 123 mg/dL — ABNORMAL HIGH (ref 65–99)
Potassium: 4.5 mmol/L (ref 3.5–5.3)
Sodium: 139 mmol/L (ref 135–146)
Total Bilirubin: 0.5 mg/dL (ref 0.2–1.2)
Total Protein: 7.1 g/dL (ref 6.1–8.1)

## 2022-07-11 LAB — TEST AUTHORIZATION

## 2022-07-11 LAB — HEMOGLOBIN A1C: Hgb A1c MFr Bld: 7 %{Hb} — ABNORMAL HIGH (ref <5.7)

## 2022-07-13 ENCOUNTER — Ambulatory Visit: Payer: BC Managed Care – PPO | Admitting: Family Medicine

## 2022-07-13 VITALS — BP 106/70 | HR 74 | Ht 68.0 in | Wt 225.0 lb

## 2022-07-13 DIAGNOSIS — E119 Type 2 diabetes mellitus without complications: Secondary | ICD-10-CM | POA: Diagnosis not present

## 2022-07-13 DIAGNOSIS — E039 Hypothyroidism, unspecified: Secondary | ICD-10-CM | POA: Diagnosis not present

## 2022-07-13 DIAGNOSIS — J45990 Exercise induced bronchospasm: Secondary | ICD-10-CM

## 2022-07-13 DIAGNOSIS — N419 Inflammatory disease of prostate, unspecified: Secondary | ICD-10-CM

## 2022-07-13 MED ORDER — ALBUTEROL SULFATE HFA 108 (90 BASE) MCG/ACT IN AERS
2.0000 | INHALATION_SPRAY | Freq: Four times a day (QID) | RESPIRATORY_TRACT | 11 refills | Status: AC | PRN
Start: 2022-07-13 — End: ?

## 2022-07-13 NOTE — Assessment & Plan Note (Signed)
He is seeing [facility].  His NP thyroid was recently increased

## 2022-07-13 NOTE — Progress Notes (Signed)
Scott Fields - 57 y.o. male MRN [id number]  Date of birth: 08/13/1965  Subjective Chief Complaint  Patient presents with   Fatigue   Follow-up    HPI Scott [name] a 57 year old male here today for follow-up visit.  Recently seen by Dr. Benjamin Fields for prostatitis.  Has an additional week of Cipro left but reports he is feeling better.  Pain is improved and urinary frequency has improved.  He has not had any fever or chills.  He is tolerating Cipro pretty well at this time.  He is concerned about getting back to exercise due to potential for tendon injury related to this.  Lab work did show that his A1c has increased to 7.0%.  No prior history of diabetes.  He admits that diet and lifestyle have not been very good and would prefer to work on this before adding medication.  He does have his A1c monitored regularly through [facility] as well.  He remains on NP thyroid and testosterone which is managed through their clinic.  ROS:  A comprehensive ROS was completed and negative except as noted per HPI  Allergies  Allergen Reactions   Naproxen Nausea And Vomiting    Past Medical History:  Diagnosis Date   ADHD (attention deficit hyperactivity disorder)    Allergy    Appendicitis 1998   Arthritis    Asthma    Exercise Induced   Fracture of wrist 1984   Fracture, maxillary (HCC) 2009   GERD (gastroesophageal reflux disease)    Hyperlipidemia    Low testosterone 09/15/2017   PONV (postoperative nausea and vomiting)    Prediabetes 09/15/2017   Sleep apnea    Staph infection 2000 and 2002   staph infections post surgeries appe and vasectomy   Thyroid disease     Past Surgical History:  Procedure Laterality Date   APPENDECTOMY     CHONDROPLASTY Right 12/17/2016   Procedure: CHONDROPLASTY;  Surgeon: Loreta Ave, MD;  Location: [facility];  Service: Orthopedics;  Laterality: Right;   KNEE ARTHROSCOPY Right 12/17/2016   Procedure: KNEE ARTHROSCOPY, REMOVAL OF  LOOSE BODIES;  Surgeon: Loreta Ave, MD;  Location: [facility];  Service: Orthopedics;  Laterality: Right;    Social History   Socioeconomic History   Marital status: Married    Spouse name: Not on file   Number of children: Not on file   Years of education: Not on file   Highest education level: Master's degree (e.g., MA, MS, MEng, MEd, MSW, MBA)  Occupational History   Not on file  Tobacco Use   Smoking status: Never   Smokeless tobacco: Never  Vaping Use   Vaping Use: Never used  Substance and Sexual Activity   Alcohol use: Not Currently    Alcohol/week: 1.0 standard drink of alcohol    Types: 1 Glasses of wine per week    Comment: every several months   Drug use: No   Sexual activity: Yes    Partners: Female  Other Topics Concern   Not on file  Social History Narrative   Not on file   Social Determinants of Health   Financial Resource Strain: Low Risk  (07/13/2022)   Overall Financial Resource Strain (CARDIA)    Difficulty of Paying Living Expenses: Not hard at all  Food Insecurity: No Food Insecurity (07/13/2022)   Hunger Vital Sign    Worried About Running Out of Food in the Last Year: Never true    Ran Out of  Food in the Last Year: Never true  Transportation Needs: No Transportation Needs (07/13/2022)   PRAPARE - Administrator, Civil Service (Medical): No    Lack of Transportation (Non-Medical): No  Physical Activity: Sufficiently Active (07/13/2022)   Exercise Vital Sign    Days of Exercise per Week: 5 days    Minutes of Exercise per Session: 30 min  Stress: Stress Concern Present (07/13/2022)   [employer] of Occupational Health - Occupational Stress Questionnaire    Feeling of Stress : To some extent  Social Connections: Socially Integrated (07/13/2022)   Social Connection and Isolation Panel [NHANES]    Frequency of Communication with Friends and Family: More than three times a week    Frequency of Social Gatherings with  Friends and Family: Once a week    Attends Religious Services: More than 4 times per year    Active Member of [employer] or Organizations: Yes    Attends Engineer, structural: More than 4 times per year    Marital Status: Married    Family History  Problem Relation Age of Onset   Diabetes Mother    Hypertension Mother    Heart attack Father    Emphysema Father    Heart failure Father    Alcohol abuse Maternal Aunt    Alcohol abuse Maternal Uncle    Colon cancer Neg Hx    Esophageal cancer Neg Hx    Rectal cancer Neg Hx    Stomach cancer Neg Hx     Health Maintenance  Topic Date Due   FOOT EXAM  Never done   OPHTHALMOLOGY EXAM  Never done   Diabetic kidney evaluation - Urine ACR  Never done   INFLUENZA VACCINE  09/24/2022   HEMOGLOBIN A1C  01/06/2023   Diabetic kidney evaluation - eGFR measurement  07/06/2023   COLONOSCOPY (Pts 45-41yrs Insurance coverage will need to be confirmed)  09/29/2028   DTaP/Tdap/Td (4 - Td or Tdap) 02/02/2030   Hepatitis C Screening  Completed   HIV Screening  Completed   Zoster Vaccines- Shingrix  Completed   HPV VACCINES  Aged Out   COVID-19 Vaccine  Discontinued     ----------------------------------------------------------------------------------------------------------------------------------------------------------------------------------------------------------------- Physical Exam BP 106/70 (BP Location: Left Arm, Patient Position: Sitting, Cuff Size: Large)   Pulse 74   Ht 5\' 8"  (1.727 m)   Wt 225 lb (102.1 kg)   SpO2 99%   BMI 34.21 kg/m   Physical Exam Constitutional:      Appearance: Normal appearance.  HENT:     Head: Normocephalic and atraumatic.  Eyes:     General: No scleral icterus. Pulmonary:     Effort: Pulmonary effort is normal.     Breath sounds: Normal breath sounds.  Neurological:     Mental Status: He is alert.      ------------------------------------------------------------------------------------------------------------------------------------------------------------------------------------------------------------------- Assessment and Plan  Exercise-induced asthma Albuterol renewed.  Adult hypothyroidism He is seeing [facility].  His NP thyroid was recently increased  Type 2 diabetes mellitus without complications (HCC) A1c is increased to 7.0%. A1c as high at 7.1% in the past as well, controlled with dietary changes previously.  He would like to work on dietary changes once again prior to adding additional medication.    Prostatitis Symptoms are improving.  He has an additional week of Cipro left which she will complete.  We did discuss the potential for tendon injury associated with this but I think he could gradually ease back into exercise starting with some cardio  and light strength training.   Meds ordered this encounter  Medications   albuterol (PROAIR HFA) 108 (90 Base) MCG/ACT inhaler    Sig: Inhale 2 puffs into the lungs every 6 (six) hours as needed for wheezing or shortness of breath.    Dispense:  8.5 each    Refill:  11    Return in about 3 months (around 10/13/2022) for f/u blood sugars.    This visit occurred during the SARS-CoV-2 public health emergency.  Safety protocols were in place, including screening questions prior to the visit, additional usage of staff PPE, and extensive cleaning of exam room while observing appropriate contact time as indicated for disinfecting solutions.

## 2022-07-13 NOTE — Assessment & Plan Note (Signed)
Albuterol renewed

## 2022-07-13 NOTE — Assessment & Plan Note (Addendum)
A1c is increased to 7.0%. A1c as high at 7.1% in the past as well, controlled with dietary changes previously.  He would like to work on dietary changes once again prior to adding additional medication.

## 2022-07-13 NOTE — Assessment & Plan Note (Signed)
Symptoms are improving.  He has an additional week of Cipro left which she will complete.  We did discuss the potential for tendon injury associated with this but I think he could gradually ease back into exercise starting with some cardio and light strength training.

## 2022-07-27 DIAGNOSIS — F411 Generalized anxiety disorder: Secondary | ICD-10-CM | POA: Diagnosis not present

## 2022-07-27 DIAGNOSIS — F331 Major depressive disorder, recurrent, moderate: Secondary | ICD-10-CM | POA: Diagnosis not present

## 2022-08-12 ENCOUNTER — Encounter: Payer: Self-pay | Admitting: Family Medicine

## 2022-08-12 MED ORDER — DEXMETHYLPHENIDATE HCL ER 15 MG PO CP24: 15.0000 mg | ORAL_CAPSULE | Freq: Every day | ORAL | 0 refills | Status: DC

## 2022-08-24 DIAGNOSIS — F411 Generalized anxiety disorder: Secondary | ICD-10-CM | POA: Diagnosis not present

## 2022-08-24 DIAGNOSIS — F331 Major depressive disorder, recurrent, moderate: Secondary | ICD-10-CM | POA: Diagnosis not present

## 2022-09-21 DIAGNOSIS — F331 Major depressive disorder, recurrent, moderate: Secondary | ICD-10-CM | POA: Diagnosis not present

## 2022-09-21 DIAGNOSIS — F411 Generalized anxiety disorder: Secondary | ICD-10-CM | POA: Diagnosis not present

## 2022-09-25 DIAGNOSIS — E119 Type 2 diabetes mellitus without complications: Secondary | ICD-10-CM | POA: Diagnosis not present

## 2022-09-25 DIAGNOSIS — E039 Hypothyroidism, unspecified: Secondary | ICD-10-CM | POA: Diagnosis not present

## 2022-10-13 ENCOUNTER — Ambulatory Visit: Payer: BC Managed Care – PPO | Admitting: Family Medicine

## 2022-10-13 DIAGNOSIS — E119 Type 2 diabetes mellitus without complications: Secondary | ICD-10-CM | POA: Diagnosis not present

## 2022-10-13 DIAGNOSIS — F3342 Major depressive disorder, recurrent, in full remission: Secondary | ICD-10-CM | POA: Diagnosis not present

## 2022-10-13 DIAGNOSIS — E039 Hypothyroidism, unspecified: Secondary | ICD-10-CM | POA: Diagnosis not present

## 2022-10-13 DIAGNOSIS — R7989 Other specified abnormal findings of blood chemistry: Secondary | ICD-10-CM | POA: Diagnosis not present

## 2022-10-19 DIAGNOSIS — F331 Major depressive disorder, recurrent, moderate: Secondary | ICD-10-CM | POA: Diagnosis not present

## 2022-10-19 DIAGNOSIS — F411 Generalized anxiety disorder: Secondary | ICD-10-CM | POA: Diagnosis not present

## 2022-11-09 ENCOUNTER — Encounter: Payer: Self-pay | Admitting: Family Medicine

## 2022-11-09 DIAGNOSIS — E119 Type 2 diabetes mellitus without complications: Secondary | ICD-10-CM

## 2022-11-17 ENCOUNTER — Ambulatory Visit: Payer: BC Managed Care – PPO | Admitting: Family Medicine

## 2022-12-03 DIAGNOSIS — R41840 Attention and concentration deficit: Secondary | ICD-10-CM | POA: Diagnosis not present

## 2022-12-03 DIAGNOSIS — F1210 Cannabis abuse, uncomplicated: Secondary | ICD-10-CM | POA: Diagnosis not present

## 2022-12-03 DIAGNOSIS — F331 Major depressive disorder, recurrent, moderate: Secondary | ICD-10-CM | POA: Diagnosis not present

## 2022-12-03 DIAGNOSIS — Z79899 Other long term (current) drug therapy: Secondary | ICD-10-CM | POA: Diagnosis not present

## 2022-12-03 DIAGNOSIS — F411 Generalized anxiety disorder: Secondary | ICD-10-CM | POA: Diagnosis not present

## 2022-12-03 DIAGNOSIS — G4700 Insomnia, unspecified: Secondary | ICD-10-CM | POA: Diagnosis not present

## 2022-12-04 DIAGNOSIS — E119 Type 2 diabetes mellitus without complications: Secondary | ICD-10-CM | POA: Diagnosis not present

## 2022-12-05 LAB — HEMOGLOBIN A1C
Est. average glucose Bld gHb Est-mCnc: 128 mg/dL
Hgb A1c MFr Bld: 6.1 % — ABNORMAL HIGH (ref 4.8–5.6)

## 2022-12-09 ENCOUNTER — Ambulatory Visit: Payer: BC Managed Care – PPO | Admitting: Family Medicine

## 2022-12-09 ENCOUNTER — Encounter: Payer: Self-pay | Admitting: Family Medicine

## 2022-12-09 VITALS — BP 119/66 | HR 67 | Ht 68.0 in | Wt 209.0 lb

## 2022-12-09 DIAGNOSIS — F902 Attention-deficit hyperactivity disorder, combined type: Secondary | ICD-10-CM

## 2022-12-09 DIAGNOSIS — E039 Hypothyroidism, unspecified: Secondary | ICD-10-CM | POA: Diagnosis not present

## 2022-12-09 DIAGNOSIS — R7303 Prediabetes: Secondary | ICD-10-CM | POA: Diagnosis not present

## 2022-12-09 MED ORDER — DEXMETHYLPHENIDATE HCL ER 15 MG PO CP24: 15.0000 mg | ORAL_CAPSULE | Freq: Every day | ORAL | 0 refills | Status: AC

## 2022-12-09 NOTE — Assessment & Plan Note (Addendum)
Doing well with focalin xr at current strength. Will continue.

## 2022-12-09 NOTE — Progress Notes (Signed)
Scott Fields - 57 y.o. male MRN [id number]  Date of birth: December 15, 1965  Subjective Chief Complaint  Patient presents with   Diabetes    Patient very upset that his chart shows a diagnosis of diabetes. Would like to speak to provider regarding this. Last A1C  6.1 on 12/04/2022.   ADHD    Patient requesting a rx rf of focalin to [facility].     HPI Scott [name] a 57 year old male here today for follow-up visit.  His last A1c was elevated at 7.0% at his visit in May.  He also had an A1c completed at [facility] that was 6.7% around this time.  He is working diligently to improve his blood sugars through dietary changes as well as exercise.  He is upset that his chart indicates that he has a history of diabetes.  Feel like he is doing pretty well with Focalin XR current strength.  He has had trouble obtaining this at his local pharmacy and would like it sent to a different pharmacy.  Thyroid medication is being managed by [facility].  He reports that this is stable at this time.  ROS:  A comprehensive ROS was completed and negative except as noted per HPI  Allergies  Allergen Reactions   Naproxen Nausea And Vomiting    Past Medical History:  Diagnosis Date   ADHD (attention deficit hyperactivity disorder)    Allergy    Appendicitis 1998   Arthritis    Asthma    Exercise Induced   Fracture of wrist 1984   Fracture, maxillary (HCC) 2009   GERD (gastroesophageal reflux disease)    Hyperlipidemia    Low testosterone 09/15/2017   PONV (postoperative nausea and vomiting)    Prediabetes 09/15/2017   Sleep apnea    Staph infection 2000 and 2002   staph infections post surgeries appe and vasectomy   Thyroid disease     Past Surgical History:  Procedure Laterality Date   APPENDECTOMY     CHONDROPLASTY Right 12/17/2016   Procedure: CHONDROPLASTY;  Surgeon: Loreta Ave, MD;  Location: [facility];  Service: Orthopedics;   Laterality: Right;   KNEE ARTHROSCOPY Right 12/17/2016   Procedure: KNEE ARTHROSCOPY, REMOVAL OF LOOSE BODIES;  Surgeon: Loreta Ave, MD;  Location: [facility];  Service: Orthopedics;  Laterality: Right;    Social History   Socioeconomic History   Marital status: Married    Spouse name: Not on file   Number of children: Not on file   Years of education: Not on file   Highest education level: Master's degree (e.g., MA, MS, MEng, MEd, MSW, MBA)  Occupational History   Not on file  Tobacco Use   Smoking status: Never   Smokeless tobacco: Never  Vaping Use   Vaping status: Never Used  Substance and Sexual Activity   Alcohol use: Not Currently    Alcohol/week: 1.0 standard drink of alcohol    Types: 1 Glasses of wine per week    Comment: every several months   Drug use: No   Sexual activity: Yes    Partners: Female  Other Topics Concern   Not on file  Social History Narrative   Not on file   Social Determinants of Health   Financial Resource Strain: Low Risk  (07/13/2022)   Overall Financial Resource Strain (CARDIA)    Difficulty of Paying Living Expenses: Not hard at all  Food Insecurity: No Food Insecurity (07/13/2022)   Hunger  Vital Sign    Worried About Programme researcher, broadcasting/film/video in the Last Year: Never true    Ran Out of Food in the Last Year: Never true  Transportation Needs: No Transportation Needs (07/13/2022)   PRAPARE - Administrator, Civil Service (Medical): No    Lack of Transportation (Non-Medical): No  Physical Activity: Sufficiently Active (07/13/2022)   Exercise Vital Sign    Days of Exercise per Week: 5 days    Minutes of Exercise per Session: 30 min  Stress: Stress Concern Present (07/13/2022)   [employer] of Occupational Health - Occupational Stress Questionnaire    Feeling of Stress : To some extent  Social Connections: Socially Integrated (07/13/2022)   Social Connection and Isolation Panel [NHANES]    Frequency of  Communication with Friends and Family: More than three times a week    Frequency of Social Gatherings with Friends and Family: Once a week    Attends Religious Services: More than 4 times per year    Active Member of [employer] or Organizations: Yes    Attends Engineer, structural: More than 4 times per year    Marital Status: Married    Family History  Problem Relation Age of Onset   Diabetes Mother    Hypertension Mother    Heart attack Father    Emphysema Father    Heart failure Father    Alcohol abuse Maternal Aunt    Alcohol abuse Maternal Uncle    Colon cancer Neg Hx    Esophageal cancer Neg Hx    Rectal cancer Neg Hx    Stomach cancer Neg Hx     Health Maintenance  Topic Date Due   OPHTHALMOLOGY EXAM  Never done   Diabetic kidney evaluation - Urine ACR  Never done   INFLUENZA VACCINE  05/24/2023 (Originally 09/24/2022)   HEMOGLOBIN A1C  06/04/2023   Diabetic kidney evaluation - eGFR measurement  07/06/2023   FOOT EXAM  12/09/2023   Colonoscopy  09/29/2028   DTaP/Tdap/Td (4 - Td or Tdap) 02/02/2030   Hepatitis C Screening  Completed   HIV Screening  Completed   Zoster Vaccines- Shingrix  Completed   HPV VACCINES  Aged Out   COVID-19 Vaccine  Discontinued     ----------------------------------------------------------------------------------------------------------------------------------------------------------------------------------------------------------------- Physical Exam BP 119/66   Pulse 67   Ht 5\' 8"  (1.727 m)   Wt 209 lb (94.8 kg)   SpO2 98%   BMI 31.78 kg/m   Physical Exam Constitutional:      Appearance: Normal appearance.  Neurological:     Mental Status: He is alert.     ------------------------------------------------------------------------------------------------------------------------------------------------------------------------------------------------------------------- Assessment and Plan  Prediabetes History of  prediabetes.  A1c briefly at 7.0% in May 2024 but this was after an illness.  He has made changes to diet and exercise to keep blood sugars better controlled.   Adult hypothyroidism He is seeing [facility].  His NP thyroid was recently increased  Attention deficit hyperactivity disorder Doing well with focalin xr at current strength. Will continue.  Meds ordered this encounter  Medications   dexmethylphenidate (FOCALIN XR) 15 MG 24 hr capsule    Sig: Take 1 capsule (15 mg total) by mouth daily.    Dispense:  30 capsule    Refill:  0    Return in about 6 months (around 06/09/2023), or Prediabetes.    This visit occurred during the SARS-CoV-2 public health emergency.  Safety protocols were in place, including screening questions prior to the  visit, additional usage of staff PPE, and extensive cleaning of exam room while observing appropriate contact time as indicated for disinfecting solutions.

## 2022-12-09 NOTE — Assessment & Plan Note (Signed)
History of prediabetes.  A1c briefly at 7.0% in May 2024 but this was after an illness.  He has made changes to diet and exercise to keep blood sugars better controlled.

## 2022-12-09 NOTE — Assessment & Plan Note (Signed)
He is seeing [facility].  His NP thyroid was recently increased

## 2022-12-14 DIAGNOSIS — R41840 Attention and concentration deficit: Secondary | ICD-10-CM | POA: Diagnosis not present

## 2022-12-14 DIAGNOSIS — F411 Generalized anxiety disorder: Secondary | ICD-10-CM | POA: Diagnosis not present

## 2022-12-14 DIAGNOSIS — G4700 Insomnia, unspecified: Secondary | ICD-10-CM | POA: Diagnosis not present

## 2023-01-04 DIAGNOSIS — G4700 Insomnia, unspecified: Secondary | ICD-10-CM | POA: Diagnosis not present

## 2023-01-04 DIAGNOSIS — F331 Major depressive disorder, recurrent, moderate: Secondary | ICD-10-CM | POA: Diagnosis not present

## 2023-01-04 DIAGNOSIS — F411 Generalized anxiety disorder: Secondary | ICD-10-CM | POA: Diagnosis not present

## 2023-01-04 DIAGNOSIS — F902 Attention-deficit hyperactivity disorder, combined type: Secondary | ICD-10-CM | POA: Diagnosis not present

## 2023-01-11 DIAGNOSIS — F1210 Cannabis abuse, uncomplicated: Secondary | ICD-10-CM | POA: Diagnosis not present

## 2023-01-11 DIAGNOSIS — F411 Generalized anxiety disorder: Secondary | ICD-10-CM | POA: Diagnosis not present

## 2023-01-11 DIAGNOSIS — F902 Attention-deficit hyperactivity disorder, combined type: Secondary | ICD-10-CM | POA: Diagnosis not present

## 2023-01-11 DIAGNOSIS — F331 Major depressive disorder, recurrent, moderate: Secondary | ICD-10-CM | POA: Diagnosis not present

## 2023-02-01 DIAGNOSIS — F902 Attention-deficit hyperactivity disorder, combined type: Secondary | ICD-10-CM | POA: Diagnosis not present

## 2023-02-01 DIAGNOSIS — F331 Major depressive disorder, recurrent, moderate: Secondary | ICD-10-CM | POA: Diagnosis not present

## 2023-02-01 DIAGNOSIS — G4700 Insomnia, unspecified: Secondary | ICD-10-CM | POA: Diagnosis not present

## 2023-02-01 DIAGNOSIS — F411 Generalized anxiety disorder: Secondary | ICD-10-CM | POA: Diagnosis not present

## 2023-02-08 DIAGNOSIS — F411 Generalized anxiety disorder: Secondary | ICD-10-CM | POA: Diagnosis not present

## 2023-02-08 DIAGNOSIS — R41840 Attention and concentration deficit: Secondary | ICD-10-CM | POA: Diagnosis not present

## 2023-02-08 DIAGNOSIS — F331 Major depressive disorder, recurrent, moderate: Secondary | ICD-10-CM | POA: Diagnosis not present

## 2023-02-08 DIAGNOSIS — G4700 Insomnia, unspecified: Secondary | ICD-10-CM | POA: Diagnosis not present

## 2023-03-01 DIAGNOSIS — G4700 Insomnia, unspecified: Secondary | ICD-10-CM | POA: Diagnosis not present

## 2023-03-01 DIAGNOSIS — F331 Major depressive disorder, recurrent, moderate: Secondary | ICD-10-CM | POA: Diagnosis not present

## 2023-03-01 DIAGNOSIS — F411 Generalized anxiety disorder: Secondary | ICD-10-CM | POA: Diagnosis not present

## 2023-03-01 DIAGNOSIS — F902 Attention-deficit hyperactivity disorder, combined type: Secondary | ICD-10-CM | POA: Diagnosis not present

## 2023-03-06 DIAGNOSIS — J0101 Acute recurrent maxillary sinusitis: Secondary | ICD-10-CM | POA: Diagnosis not present

## 2023-03-09 DIAGNOSIS — G4700 Insomnia, unspecified: Secondary | ICD-10-CM | POA: Diagnosis not present

## 2023-03-09 DIAGNOSIS — R41840 Attention and concentration deficit: Secondary | ICD-10-CM | POA: Diagnosis not present

## 2023-03-09 DIAGNOSIS — F331 Major depressive disorder, recurrent, moderate: Secondary | ICD-10-CM | POA: Diagnosis not present

## 2023-03-09 DIAGNOSIS — F411 Generalized anxiety disorder: Secondary | ICD-10-CM | POA: Diagnosis not present

## 2023-03-31 ENCOUNTER — Ambulatory Visit: Payer: BC Managed Care – PPO | Admitting: Family Medicine

## 2023-03-31 VITALS — BP 123/74 | HR 72 | Ht 68.0 in | Wt 230.0 lb

## 2023-03-31 DIAGNOSIS — E119 Type 2 diabetes mellitus without complications: Secondary | ICD-10-CM | POA: Diagnosis not present

## 2023-03-31 DIAGNOSIS — R7303 Prediabetes: Secondary | ICD-10-CM

## 2023-03-31 LAB — POCT UA - MICROALBUMIN
Creatinine, POC: 50 mg/dL
Microalbumin Ur, POC: 10 mg/L

## 2023-03-31 LAB — POCT GLYCOSYLATED HEMOGLOBIN (HGB A1C): Hemoglobin A1C: 6.8 % — AB (ref 4.0–5.6)

## 2023-03-31 MED ORDER — TIRZEPATIDE 2.5 MG/0.5ML ~~LOC~~ SOAJ
2.5000 mg | SUBCUTANEOUS | 0 refills | Status: DC
Start: 2023-03-31 — End: 2023-05-18

## 2023-03-31 MED ORDER — TIRZEPATIDE 7.5 MG/0.5ML ~~LOC~~ SOAJ: 7.5000 mg | SUBCUTANEOUS | 0 refills | Status: DC

## 2023-03-31 MED ORDER — TIRZEPATIDE 5 MG/0.5ML ~~LOC~~ SOAJ: 5.0000 mg | SUBCUTANEOUS | 0 refills | Status: DC

## 2023-03-31 NOTE — Progress Notes (Signed)
 Scott Fields - 58 y.o. male MRN [id number]  Date of birth: 02-Apr-1965  Subjective Chief Complaint  Patient presents with   Weight Loss    HPI Scott [name] a 58 y.o. male here today to discuss GLP-1 use for management of his weight and pre-diabetes.   A1c as high as 7.0% eight months ago which put him in the diabetes range at that time.  He was able to get this down to 6.1%.  He reports that recently he has been stress eating and weigh is up about 20+ pounds.    ROS:  A comprehensive ROS was completed and negative except as noted per HPI  Allergies  Allergen Reactions   Naproxen Nausea And Vomiting    Past Medical History:  Diagnosis Date   ADHD (attention deficit hyperactivity disorder)    Allergy    Appendicitis 1998   Arthritis    Asthma    Exercise Induced   Fracture of wrist 1984   Fracture, maxillary (HCC) 2009   GERD (gastroesophageal reflux disease)    Hyperlipidemia    Low testosterone  09/15/2017   PONV (postoperative nausea and vomiting)    Prediabetes 09/15/2017   Sleep apnea    Staph infection 2000 and 2002   staph infections post surgeries appe and vasectomy   Thyroid  disease     Past Surgical History:  Procedure Laterality Date   APPENDECTOMY     CHONDROPLASTY Right 12/17/2016   Procedure: CHONDROPLASTY;  Surgeon: Beverley Toribio FALCON, MD;  Location: [facility];  Service: Orthopedics;  Laterality: Right;   KNEE ARTHROSCOPY Right 12/17/2016   Procedure: KNEE ARTHROSCOPY, REMOVAL OF LOOSE BODIES;  Surgeon: Beverley Toribio FALCON, MD;  Location: [facility];  Service: Orthopedics;  Laterality: Right;    Social History   Socioeconomic History   Marital status: Married    Spouse name: Not on file   Number of children: Not on file   Years of education: Not on file   Highest education level: Master's degree (e.g., MA, MS, MEng, MEd, MSW, MBA)  Occupational History   Not on file  Tobacco Use   Smoking status: Never   Smokeless  tobacco: Never  Vaping Use   Vaping status: Never Used  Substance and Sexual Activity   Alcohol use: Not Currently    Alcohol/week: 1.0 standard drink of alcohol    Types: 1 Glasses of wine per week    Comment: every several months   Drug use: No   Sexual activity: Yes    Partners: Female  Other Topics Concern   Not on file  Social History Narrative   Not on file   Social Drivers of Health   Financial Resource Strain: Low Risk  (03/31/2023)   Overall Financial Resource Strain (CARDIA)    Difficulty of Paying Living Expenses: Not hard at all  Food Insecurity: No Food Insecurity (03/31/2023)   Hunger Vital Sign    Worried About Running Out of Food in the Last Year: Never true    Ran Out of Food in the Last Year: Never true  Transportation Needs: No Transportation Needs (03/31/2023)   PRAPARE - Administrator, Civil Service (Medical): No    Lack of Transportation (Non-Medical): No  Physical Activity: Insufficiently Active (03/31/2023)   Exercise Vital Sign    Days of Exercise per Week: 4 days    Minutes of Exercise per Session: 30 min  Stress: Stress Concern Present (03/31/2023)   [employer] of Occupational  Health - Occupational Stress Questionnaire    Feeling of Stress : To some extent  Social Connections: Socially Integrated (03/31/2023)   Social Connection and Isolation Panel [NHANES]    Frequency of Communication with Friends and Family: Twice a week    Frequency of Social Gatherings with Friends and Family: Once a week    Attends Religious Services: More than 4 times per year    Active Member of Clubs or Organizations: Yes    Attends Engineer, Structural: More than 4 times per year    Marital Status: Married    Family History  Problem Relation Age of Onset   Diabetes Mother    Hypertension Mother    Heart attack Father    Emphysema Father    Heart failure Father    Alcohol abuse Maternal Aunt    Alcohol abuse Maternal Uncle    Colon cancer Neg  Hx    Esophageal cancer Neg Hx    Rectal cancer Neg Hx    Stomach cancer Neg Hx     Health Maintenance  Topic Date Due   Pneumococcal Vaccine 59-73 Years old (1 of 2 - PCV) Never done   OPHTHALMOLOGY EXAM  Never done   INFLUENZA VACCINE  05/24/2023 (Originally 09/24/2022)   Diabetic kidney evaluation - eGFR measurement  07/06/2023   HEMOGLOBIN A1C  09/28/2023   FOOT EXAM  12/09/2023   Diabetic kidney evaluation - Urine ACR  03/30/2024   Colonoscopy  09/29/2028   DTaP/Tdap/Td (4 - Td or Tdap) 02/02/2030   Hepatitis C Screening  Completed   HIV Screening  Completed   Zoster Vaccines- Shingrix   Completed   HPV VACCINES  Aged Out   COVID-19 Vaccine  Discontinued     ----------------------------------------------------------------------------------------------------------------------------------------------------------------------------------------------------------------- Physical Exam BP 123/74 (BP Location: Left Arm, Patient Position: Sitting, Cuff Size: Large)   Pulse 72   Ht 5' 8 (1.727 m)   Wt 230 lb (104.3 kg)   SpO2 98%   BMI 34.97 kg/m   Physical Exam Constitutional:      Appearance: Normal appearance.  Cardiovascular:     Rate and Rhythm: Normal rate and regular rhythm.  Pulmonary:     Effort: Pulmonary effort is normal.     Breath sounds: Normal breath sounds.  Neurological:     Mental Status: He is alert.  Psychiatric:        Mood and Affect: Mood normal.        Behavior: Behavior normal.     ------------------------------------------------------------------------------------------------------------------------------------------------------------------------------------------------------------------- Assessment and Plan  Type 2 diabetes mellitus without complications (HCC) His A1c has increased back to the diabetes range.  Adding mounjaro  and will titrate over the new few months.  Dietary changes and recommend adding exercise back in.  Follow up in 3  months.    Meds ordered this encounter  Medications   tirzepatide  (MOUNJARO ) 2.5 MG/0.5ML Pen    Sig: Inject 2.5 mg into the skin once a week. Increase to 5mg  after 4 weeks.    Dispense:  2 mL    Refill:  0   tirzepatide  (MOUNJARO ) 5 MG/0.5ML Pen    Sig: Inject 5 mg into the skin once a week. Increase to 7.5mg  after 4 weeks.    Dispense:  2 mL    Refill:  0   tirzepatide  (MOUNJARO ) 7.5 MG/0.5ML Pen    Sig: Inject 7.5 mg into the skin once a week.    Dispense:  6 mL    Refill:  0    Return  in about 3 months (around 06/28/2023) for Type 2 Diabetes.    This visit occurred during the SARS-CoV-2 public health emergency.  Safety protocols were in place, including screening questions prior to the visit, additional usage of staff PPE, and extensive cleaning of exam room while observing appropriate contact time as indicated for disinfecting solutions.

## 2023-03-31 NOTE — Assessment & Plan Note (Signed)
His A1c has increased back to the diabetes range.  Adding mounjaro and will titrate over the new few months.  Dietary changes and recommend adding exercise back in.  Follow up in 3 months.

## 2023-04-05 DIAGNOSIS — F1210 Cannabis abuse, uncomplicated: Secondary | ICD-10-CM | POA: Diagnosis not present

## 2023-04-05 DIAGNOSIS — F331 Major depressive disorder, recurrent, moderate: Secondary | ICD-10-CM | POA: Diagnosis not present

## 2023-04-05 DIAGNOSIS — F411 Generalized anxiety disorder: Secondary | ICD-10-CM | POA: Diagnosis not present

## 2023-04-05 DIAGNOSIS — R41840 Attention and concentration deficit: Secondary | ICD-10-CM | POA: Diagnosis not present

## 2023-04-06 LAB — HM DIABETES EYE EXAM

## 2023-04-09 ENCOUNTER — Encounter: Payer: Self-pay | Admitting: Family Medicine

## 2023-04-26 ENCOUNTER — Telehealth: Payer: Self-pay

## 2023-04-26 NOTE — Telephone Encounter (Signed)
 Prior auth for: [facility] 2.5 MG Determination: APPROVED Auth #: [id number] Valid from: 04/26/23 - 04/25/24 Patient notified via MyChart

## 2023-04-26 NOTE — Telephone Encounter (Signed)
 Prior auth for: MOUNJARO 2.5 MG Determination: Pending as of 04/26/23 Auth #: [id number] Valid from: n/a Patient notified via MyChart

## 2023-05-03 DIAGNOSIS — G4700 Insomnia, unspecified: Secondary | ICD-10-CM | POA: Diagnosis not present

## 2023-05-03 DIAGNOSIS — R41840 Attention and concentration deficit: Secondary | ICD-10-CM | POA: Diagnosis not present

## 2023-05-03 DIAGNOSIS — F411 Generalized anxiety disorder: Secondary | ICD-10-CM | POA: Diagnosis not present

## 2023-05-03 DIAGNOSIS — F331 Major depressive disorder, recurrent, moderate: Secondary | ICD-10-CM | POA: Diagnosis not present

## 2023-05-18 ENCOUNTER — Ambulatory Visit: Admitting: Family Medicine

## 2023-05-18 ENCOUNTER — Encounter: Payer: Self-pay | Admitting: Family Medicine

## 2023-05-18 VITALS — BP 127/77 | HR 75 | Ht 68.0 in | Wt 221.5 lb

## 2023-05-18 DIAGNOSIS — K5900 Constipation, unspecified: Secondary | ICD-10-CM | POA: Insufficient documentation

## 2023-05-18 MED ORDER — TIRZEPATIDE 2.5 MG/0.5ML ~~LOC~~ SOAJ: 2.5000 mg | SUBCUTANEOUS | 1 refills | Status: DC

## 2023-05-18 NOTE — Assessment & Plan Note (Signed)
 He has some IBS-C at baseline.  Worsened with addition of mounjaro.  Will continue at 2.5mg  for now.  Discussed that he may continue miralax daily.

## 2023-05-18 NOTE — Progress Notes (Signed)
 Kayen Grabel - 58 y.o. male MRN [id number]  Date of birth: 1966-01-22  Subjective Chief Complaint  Patient presents with   GI Problem    Patient c/o  constipation , belching, abdominal distention , and cognitive changes x after starting Mounjaro -about 3 weeks ago.     HPI Deane Wattenbarger is a 58 y.o. male here today for follow up.  He has concerns about GI side effects he has been experiencing since starting mounjaro.  Reports having increased constipation and belching from this.  He is currently taking the 2.5mg  strength.  He did use miralax for a few days and this helped, but was concerned about developing dependence while taking this.  He feels that his fiber and fluid intake is pretty good.  He is passing gas and having small bowel movements.   ROS:  A comprehensive ROS was completed and negative except as noted per HPI  Allergies  Allergen Reactions   Naproxen Nausea And Vomiting    Past Medical History:  Diagnosis Date   ADHD (attention deficit hyperactivity disorder)    Allergy    Appendicitis 1998   Arthritis    Asthma    Exercise Induced   Fracture of wrist 1984   Fracture, maxillary (HCC) 2009   GERD (gastroesophageal reflux disease)    Hyperlipidemia    Low testosterone 09/15/2017   PONV (postoperative nausea and vomiting)    Prediabetes 09/15/2017   Sleep apnea    Staph infection 2000 and 2002   staph infections post surgeries appe and vasectomy   Thyroid disease     Past Surgical History:  Procedure Laterality Date   APPENDECTOMY     CHONDROPLASTY Right 12/17/2016   Procedure: CHONDROPLASTY;  Surgeon: Loreta Ave, MD;  Location: [facility];  Service: Orthopedics;  Laterality: Right;   KNEE ARTHROSCOPY Right 12/17/2016   Procedure: KNEE ARTHROSCOPY, REMOVAL OF LOOSE BODIES;  Surgeon: Loreta Ave, MD;  Location: [facility];  Service: Orthopedics;  Laterality: Right;    Social History   Socioeconomic History   Marital  status: Married    Spouse name: Not on file   Number of children: Not on file   Years of education: Not on file   Highest education level: Master's degree (e.g., MA, MS, MEng, MEd, MSW, MBA)  Occupational History   Not on file  Tobacco Use   Smoking status: Never   Smokeless tobacco: Never  Vaping Use   Vaping status: Never Used  Substance and Sexual Activity   Alcohol use: Not Currently    Alcohol/week: 1.0 standard drink of alcohol    Types: 1 Glasses of wine per week    Comment: every several months   Drug use: No   Sexual activity: Yes    Partners: Female  Other Topics Concern   Not on file  Social History Narrative   Not on file   Social Drivers of Health   Financial Resource Strain: Low Risk  (03/31/2023)   Overall Financial Resource Strain (CARDIA)    Difficulty of Paying Living Expenses: Not hard at all  Food Insecurity: No Food Insecurity (03/31/2023)   Hunger Vital Sign    Worried About Running Out of Food in the Last Year: Never true    Ran Out of Food in the Last Year: Never true  Transportation Needs: No Transportation Needs (03/31/2023)   PRAPARE - Administrator, Civil Service (Medical): No    Lack of Transportation (Non-Medical): No  Physical Activity: Insufficiently Active (03/31/2023)   Exercise Vital Sign    Days of Exercise per Week: 4 days    Minutes of Exercise per Session: 30 min  Stress: Stress Concern Present (03/31/2023)   [employer] of Occupational Health - Occupational Stress Questionnaire    Feeling of Stress : To some extent  Social Connections: Socially Integrated (03/31/2023)   Social Connection and Isolation Panel [NHANES]    Frequency of Communication with Friends and Family: Twice a week    Frequency of Social Gatherings with Friends and Family: Once a week    Attends Religious Services: More than 4 times per year    Active Member of [employer] or Organizations: Yes    Attends Engineer, structural: More than 4 times per  year    Marital Status: Married    Family History  Problem Relation Age of Onset   Diabetes Mother    Hypertension Mother    Heart attack Father    Emphysema Father    Heart failure Father    Alcohol abuse Maternal Aunt    Alcohol abuse Maternal Uncle    Colon cancer Neg Hx    Esophageal cancer Neg Hx    Rectal cancer Neg Hx    Stomach cancer Neg Hx     Health Maintenance  Topic Date Due   Pneumococcal Vaccine 9-12 Years old (1 of 2 - PCV) Never done   OPHTHALMOLOGY EXAM  Never done   INFLUENZA VACCINE  05/24/2023 (Originally 09/24/2022)   Diabetic kidney evaluation - eGFR measurement  07/06/2023   HEMOGLOBIN A1C  09/28/2023   FOOT EXAM  12/09/2023   Diabetic kidney evaluation - Urine ACR  03/30/2024   Colonoscopy  09/29/2028   DTaP/Tdap/Td (4 - Td or Tdap) 02/02/2030   Hepatitis C Screening  Completed   HIV Screening  Completed   Zoster Vaccines- Shingrix  Completed   HPV VACCINES  Aged Out   COVID-19 Vaccine  Discontinued     ----------------------------------------------------------------------------------------------------------------------------------------------------------------------------------------------------------------- Physical Exam BP 127/77   Pulse 75   Ht 5\' 8"  (1.727 m)   Wt 221 lb 8 oz (100.5 kg)   SpO2 99%   BMI 33.68 kg/m   Physical Exam Constitutional:      Appearance: Normal appearance.  Eyes:     General: No scleral icterus. Cardiovascular:     Rate and Rhythm: Normal rate and regular rhythm.  Pulmonary:     Effort: Pulmonary effort is normal.     Breath sounds: Normal breath sounds.  Musculoskeletal:     Cervical back: Neck supple.  Neurological:     Mental Status: He is alert.  Psychiatric:        Mood and Affect: Mood normal.        Behavior: Behavior normal.      ------------------------------------------------------------------------------------------------------------------------------------------------------------------------------------------------------------------- Assessment and Plan  Constipation He has some IBS-C at baseline.  Worsened with addition of mounjaro.  Will continue at 2.5mg  for now.  Discussed that he may continue miralax daily.    Meds ordered this encounter  Medications   tirzepatide (MOUNJARO) 2.5 MG/0.5ML Pen    Sig: Inject 2.5 mg into the skin once a week.    Dispense:  2 mL    Refill:  1    No follow-ups on file.    This visit occurred during the SARS-CoV-2 public health emergency.  Safety protocols were in place, including screening questions prior to the visit, additional usage of staff PPE, and extensive cleaning of exam room  while observing appropriate contact time as indicated for disinfecting solutions.

## 2023-05-18 NOTE — Patient Instructions (Signed)
 Continue miralax 1-2x daily Stay well hydrated and get plenty of fiber in your diet.

## 2023-05-25 ENCOUNTER — Ambulatory Visit

## 2023-05-25 ENCOUNTER — Ambulatory Visit: Admitting: Physician Assistant

## 2023-05-25 ENCOUNTER — Ambulatory Visit: Admitting: Sports Medicine

## 2023-05-25 ENCOUNTER — Encounter: Payer: Self-pay | Admitting: Sports Medicine

## 2023-05-25 DIAGNOSIS — S62646A Nondisplaced fracture of proximal phalanx of right little finger, initial encounter for closed fracture: Secondary | ICD-10-CM | POA: Diagnosis not present

## 2023-05-25 DIAGNOSIS — S52611A Displaced fracture of right ulna styloid process, initial encounter for closed fracture: Secondary | ICD-10-CM | POA: Diagnosis not present

## 2023-05-25 DIAGNOSIS — M79641 Pain in right hand: Secondary | ICD-10-CM

## 2023-05-25 NOTE — Assessment & Plan Note (Signed)
 This is a very pleasant 57 year old male, 2 days ago he was trimming some tree branches, one of the branches rolled, hit his ladder knocking it over, he fell directly onto his side and injured his right hand, since then he has had pain that he localizes at the fifth metacarpal phalangeal joint, he has difficulty bending the finger. On exam he does have some swelling, minimal bruising, he has tenderness at the distal fifth metacarpal itself, otherwise good motion and good strength. Adding some x-rays, he will continue buddy taping, over-the-counter ibuprofen is sufficient, follow-up depends on results although tentatively we will say 2 weeks.  Update: As expected there is a tiny fracture at the lateral base of the fifth proximal phalanx, no change in plan for now, if pain control is inadequate with the buddy taping we can put him in a custom ulnar gutter splint.

## 2023-05-25 NOTE — Progress Notes (Addendum)
    Procedures performed today:    None.  Independent interpretation of notes and tests performed by another provider:   None.  Brief History, Exam, Impression, and Recommendations:    Fracture right fifth proximal phalangeal lateral base This is a very pleasant 58 year old male, 2 days ago he was trimming some tree branches, one of the branches rolled, hit his ladder knocking it over, he fell directly onto his side and injured his right hand, since then he has had pain that he localizes at the fifth metacarpal phalangeal joint, he has difficulty bending the finger. On exam he does have some swelling, minimal bruising, he has tenderness at the distal fifth metacarpal itself, otherwise good motion and good strength. Adding some x-rays, he will continue buddy taping, over-the-counter ibuprofen  is sufficient, follow-up depends on results although tentatively we will say 2 weeks.  Update: As expected there is a tiny fracture at the lateral base of the fifth proximal phalanx, no change in plan for now, if pain control is inadequate with the buddy taping we can put him in a custom ulnar gutter splint.    ____________________________________________ Debby PARAS. Curtis, M.D., ABFM., CAQSM., AME. Primary Care and Sports Medicine [facility]  Adjunct Professor of [facility] Medicine  [employer]  Restaurant Manager, [employer]

## 2023-05-31 DIAGNOSIS — F902 Attention-deficit hyperactivity disorder, combined type: Secondary | ICD-10-CM | POA: Diagnosis not present

## 2023-05-31 DIAGNOSIS — G4700 Insomnia, unspecified: Secondary | ICD-10-CM | POA: Diagnosis not present

## 2023-05-31 DIAGNOSIS — F411 Generalized anxiety disorder: Secondary | ICD-10-CM | POA: Diagnosis not present

## 2023-05-31 DIAGNOSIS — F1210 Cannabis abuse, uncomplicated: Secondary | ICD-10-CM | POA: Diagnosis not present

## 2023-06-10 ENCOUNTER — Ambulatory Visit: Payer: BC Managed Care – PPO | Admitting: Family Medicine

## 2023-06-10 ENCOUNTER — Ambulatory Visit: Admitting: Family Medicine

## 2023-06-14 DIAGNOSIS — G4700 Insomnia, unspecified: Secondary | ICD-10-CM | POA: Diagnosis not present

## 2023-06-14 DIAGNOSIS — F411 Generalized anxiety disorder: Secondary | ICD-10-CM | POA: Diagnosis not present

## 2023-06-14 DIAGNOSIS — R41840 Attention and concentration deficit: Secondary | ICD-10-CM | POA: Diagnosis not present

## 2023-06-14 DIAGNOSIS — F331 Major depressive disorder, recurrent, moderate: Secondary | ICD-10-CM | POA: Diagnosis not present

## 2023-06-22 ENCOUNTER — Encounter: Payer: Self-pay | Admitting: Family Medicine

## 2023-06-22 DIAGNOSIS — E039 Hypothyroidism, unspecified: Secondary | ICD-10-CM

## 2023-06-22 DIAGNOSIS — Z125 Encounter for screening for malignant neoplasm of prostate: Secondary | ICD-10-CM

## 2023-06-22 DIAGNOSIS — E782 Mixed hyperlipidemia: Secondary | ICD-10-CM

## 2023-06-22 DIAGNOSIS — E559 Vitamin D deficiency, unspecified: Secondary | ICD-10-CM

## 2023-06-22 DIAGNOSIS — E119 Type 2 diabetes mellitus without complications: Secondary | ICD-10-CM

## 2023-06-24 DIAGNOSIS — E559 Vitamin D deficiency, unspecified: Secondary | ICD-10-CM | POA: Diagnosis not present

## 2023-06-24 DIAGNOSIS — Z125 Encounter for screening for malignant neoplasm of prostate: Secondary | ICD-10-CM | POA: Diagnosis not present

## 2023-06-24 DIAGNOSIS — E039 Hypothyroidism, unspecified: Secondary | ICD-10-CM | POA: Diagnosis not present

## 2023-06-24 DIAGNOSIS — E119 Type 2 diabetes mellitus without complications: Secondary | ICD-10-CM | POA: Diagnosis not present

## 2023-06-24 DIAGNOSIS — E782 Mixed hyperlipidemia: Secondary | ICD-10-CM | POA: Diagnosis not present

## 2023-06-25 LAB — CBC WITH DIFFERENTIAL/PLATELET
Basophils Absolute: 0 10*3/uL (ref 0.0–0.2)
Basos: 1 %
EOS (ABSOLUTE): 0.2 10*3/uL (ref 0.0–0.4)
Eos: 3 %
Hematocrit: 48.2 % (ref 37.5–51.0)
Hemoglobin: 16.1 g/dL (ref 13.0–17.7)
Immature Grans (Abs): 0 10*3/uL (ref 0.0–0.1)
Immature Granulocytes: 1 %
Lymphocytes Absolute: 1.3 10*3/uL (ref 0.7–3.1)
Lymphs: 22 %
MCH: 31 pg (ref 26.6–33.0)
MCHC: 33.4 g/dL (ref 31.5–35.7)
MCV: 93 fL (ref 79–97)
Monocytes Absolute: 0.5 10*3/uL (ref 0.1–0.9)
Monocytes: 9 %
Neutrophils Absolute: 4 10*3/uL (ref 1.4–7.0)
Neutrophils: 64 %
Platelets: 206 10*3/uL (ref 150–450)
RBC: 5.2 x10E6/uL (ref 4.14–5.80)
RDW: 12.6 % (ref 11.6–15.4)
WBC: 6.1 10*3/uL (ref 3.4–10.8)

## 2023-06-25 LAB — CMP14+EGFR
ALT: 25 IU/L (ref 0–44)
AST: 22 IU/L (ref 0–40)
Albumin: 4.3 g/dL (ref 3.8–4.9)
Alkaline Phosphatase: 80 IU/L (ref 44–121)
BUN/Creatinine Ratio: 8 — ABNORMAL LOW (ref 9–20)
BUN: 11 mg/dL (ref 6–24)
Bilirubin Total: 0.8 mg/dL (ref 0.0–1.2)
CO2: 21 mmol/L (ref 20–29)
Calcium: 9 mg/dL (ref 8.7–10.2)
Chloride: 102 mmol/L (ref 96–106)
Creatinine, Ser: 1.38 mg/dL — ABNORMAL HIGH (ref 0.76–1.27)
Globulin, Total: 2.1 g/dL (ref 1.5–4.5)
Glucose: 99 mg/dL (ref 70–99)
Potassium: 4.9 mmol/L (ref 3.5–5.2)
Sodium: 137 mmol/L (ref 134–144)
Total Protein: 6.4 g/dL (ref 6.0–8.5)
eGFR: 59 mL/min/{1.73_m2} — ABNORMAL LOW (ref >59)

## 2023-06-25 LAB — LIPID PANEL WITH LDL/HDL RATIO
Cholesterol, Total: 174 mg/dL (ref 100–199)
HDL: 38 mg/dL — ABNORMAL LOW (ref >39)
LDL Chol Calc (NIH): 118 mg/dL — ABNORMAL HIGH (ref 0–99)
LDL/HDL Ratio: 3.1 ratio (ref 0.0–3.6)
Triglycerides: 96 mg/dL (ref 0–149)
VLDL Cholesterol Cal: 18 mg/dL (ref 5–40)

## 2023-06-25 LAB — HEMOGLOBIN A1C
Est. average glucose Bld gHb Est-mCnc: 123 mg/dL
Hgb A1c MFr Bld: 5.9 % — ABNORMAL HIGH (ref 4.8–5.6)

## 2023-06-25 LAB — PSA: Prostate Specific Ag, Serum: 0.7 ng/mL (ref 0.0–4.0)

## 2023-06-25 LAB — TSH: TSH: 0.845 u[IU]/mL (ref 0.450–4.500)

## 2023-06-25 LAB — VITAMIN D 25 HYDROXY (VIT D DEFICIENCY, FRACTURES): Vit D, 25-Hydroxy: 48.1 ng/mL (ref 30.0–100.0)

## 2023-06-28 ENCOUNTER — Encounter: Payer: Self-pay | Admitting: Family Medicine

## 2023-06-28 ENCOUNTER — Ambulatory Visit: Payer: BC Managed Care – PPO | Admitting: Family Medicine

## 2023-06-28 VITALS — BP 101/61 | HR 60 | Ht 68.0 in | Wt 211.0 lb

## 2023-06-28 DIAGNOSIS — E039 Hypothyroidism, unspecified: Secondary | ICD-10-CM

## 2023-06-28 DIAGNOSIS — E119 Type 2 diabetes mellitus without complications: Secondary | ICD-10-CM

## 2023-06-28 DIAGNOSIS — R7989 Other specified abnormal findings of blood chemistry: Secondary | ICD-10-CM | POA: Diagnosis not present

## 2023-06-28 DIAGNOSIS — E782 Mixed hyperlipidemia: Secondary | ICD-10-CM

## 2023-06-28 NOTE — Assessment & Plan Note (Signed)
 LDL improved on recent labs.  Does not want to add statin at this time.

## 2023-06-28 NOTE — Assessment & Plan Note (Signed)
 He is doing well with mounjaro and prefers to stick with 2.5mg  strength as this seems effective.  He will return in 6 months.  Encouraged continued dietary changes.

## 2023-06-28 NOTE — Assessment & Plan Note (Signed)
Management per integrative medicine.

## 2023-06-28 NOTE — Assessment & Plan Note (Signed)
He is seeing [facility].  His NP thyroid was recently increased

## 2023-06-28 NOTE — Progress Notes (Signed)
 Scott Fields - 58 y.o. male MRN [id number]  Date of birth: January 20, 1966  Subjective Chief Complaint  Patient presents with   Diabetes    HPI Scott [name] a 58 y.o. male here today for follow up visit.   He reports that he is doing well so far with mounjaro.  He is tolerating pretty well at current strength.  A1c improved to 5.9%.  Weight is down about 10lbs.   ADD stable with focalin  xr 15mg  daily.    He is still seeing integrative medicine who is managing NP thyroid  and testosterone .  Feels pretty good at current strength.   ROS:  A comprehensive ROS was completed and negative except as noted per HPI  Allergies  Allergen Reactions   Naproxen Nausea And Vomiting    Past Medical History:  Diagnosis Date   ADHD (attention deficit hyperactivity disorder)    Allergy    Appendicitis 1998   Arthritis    Asthma    Exercise Induced   Fracture of wrist 1984   Fracture, maxillary (HCC) 2009   GERD (gastroesophageal reflux disease)    Hyperlipidemia    Low testosterone  09/15/2017   PONV (postoperative nausea and vomiting)    Prediabetes 09/15/2017   Sleep apnea    Staph infection 2000 and 2002   staph infections post surgeries appe and vasectomy   Thyroid  disease     Past Surgical History:  Procedure Laterality Date   APPENDECTOMY     CHONDROPLASTY Right 12/17/2016   Procedure: CHONDROPLASTY;  Surgeon: Ferd Householder, MD;  Location: [facility];  Service: Orthopedics;  Laterality: Right;   KNEE ARTHROSCOPY Right 12/17/2016   Procedure: KNEE ARTHROSCOPY, REMOVAL OF LOOSE BODIES;  Surgeon: Ferd Householder, MD;  Location: [facility];  Service: Orthopedics;  Laterality: Right;    Social History   Socioeconomic History   Marital status: Married    Spouse name: Not on file   Number of children: Not on file   Years of education: Not on file   Highest education level: Master's degree (e.g., MA, MS, MEng, MEd, MSW, MBA)  Occupational History    Not on file  Tobacco Use   Smoking status: Never   Smokeless tobacco: Never  Vaping Use   Vaping status: Never Used  Substance and Sexual Activity   Alcohol use: Not Currently    Alcohol/week: 1.0 standard drink of alcohol    Types: 1 Glasses of wine per week    Comment: every several months   Drug use: No   Sexual activity: Yes    Partners: Female  Other Topics Concern   Not on file  Social History Narrative   Not on file   Social Drivers of Health   Financial Resource Strain: Low Risk  (03/31/2023)   Overall Financial Resource Strain (CARDIA)    Difficulty of Paying Living Expenses: Not hard at all  Food Insecurity: No Food Insecurity (03/31/2023)   Hunger Vital Sign    Worried About Running Out of Food in the Last Year: Never true    Ran Out of Food in the Last Year: Never true  Transportation Needs: No Transportation Needs (03/31/2023)   PRAPARE - Administrator, Civil Service (Medical): No    Lack of Transportation (Non-Medical): No  Physical Activity: Insufficiently Active (03/31/2023)   Exercise Vital Sign    Days of Exercise per Week: 4 days    Minutes of Exercise per Session: 30 min  Stress: Stress  Concern Present (03/31/2023)   [employer] of Occupational Health - Occupational Stress Questionnaire    Feeling of Stress : To some extent  Social Connections: Socially Integrated (03/31/2023)   Social Connection and Isolation Panel [NHANES]    Frequency of Communication with Friends and Family: Twice a week    Frequency of Social Gatherings with Friends and Family: Once a week    Attends Religious Services: More than 4 times per year    Active Member of Clubs or Organizations: Yes    Attends Engineer, structural: More than 4 times per year    Marital Status: Married    Family History  Problem Relation Age of Onset   Diabetes Mother    Hypertension Mother    Heart attack Father    Emphysema Father    Heart failure Father    Alcohol abuse  Maternal Aunt    Alcohol abuse Maternal Uncle    Colon cancer Neg Hx    Esophageal cancer Neg Hx    Rectal cancer Neg Hx    Stomach cancer Neg Hx     Health Maintenance  Topic Date Due   OPHTHALMOLOGY EXAM  Never done   Pneumococcal Vaccine 32-63 Years old (1 of 2 - PCV) Never done   INFLUENZA VACCINE  09/24/2023   FOOT EXAM  12/09/2023   HEMOGLOBIN A1C  12/25/2023   Diabetic kidney evaluation - Urine ACR  03/30/2024   Diabetic kidney evaluation - eGFR measurement  06/23/2024   Colonoscopy  09/29/2028   DTaP/Tdap/Td (4 - Td or Tdap) 02/02/2030   Hepatitis C Screening  Completed   HIV Screening  Completed   Zoster Vaccines- Shingrix   Completed   HPV VACCINES  Aged Out   Meningococcal B Vaccine  Aged Out   COVID-19 Vaccine  Discontinued     ----------------------------------------------------------------------------------------------------------------------------------------------------------------------------------------------------------------- Physical Exam BP 101/61 (BP Location: Left Arm, Patient Position: Sitting, Cuff Size: Normal)   Pulse 60   Ht 5\' 8"  (1.727 m)   Wt 211 lb (95.7 kg)   SpO2 97%   BMI 32.08 kg/m   Physical Exam Constitutional:      Appearance: Normal appearance.  Eyes:     General: No scleral icterus. Cardiovascular:     Rate and Rhythm: Normal rate and regular rhythm.  Pulmonary:     Effort: Pulmonary effort is normal.     Breath sounds: Normal breath sounds.  Musculoskeletal:     Cervical back: Neck supple.  Neurological:     Mental Status: He is alert.  Psychiatric:        Mood and Affect: Mood normal.        Behavior: Behavior normal.     ------------------------------------------------------------------------------------------------------------------------------------------------------------------------------------------------------------------- Assessment and Plan  Type 2 diabetes mellitus without complications (HCC) He is  doing well with mounjaro and prefers to stick with 2.5mg  strength as this seems effective.  He will return in 6 months.  Encouraged continued dietary changes.    Adult hypothyroidism He is seeing [facility].  His NP thyroid  was recently increased  HLD (hyperlipidemia) LDL improved on recent labs.  Does not want to add statin at this time.     Low testosterone  Management per integrative medicine.    No orders of the defined types were placed in this encounter.   Return in about 6 months (around 12/29/2023) for Type 2 Diabetes.

## 2023-07-12 DIAGNOSIS — F331 Major depressive disorder, recurrent, moderate: Secondary | ICD-10-CM | POA: Diagnosis not present

## 2023-07-12 DIAGNOSIS — F411 Generalized anxiety disorder: Secondary | ICD-10-CM | POA: Diagnosis not present

## 2023-07-12 DIAGNOSIS — F902 Attention-deficit hyperactivity disorder, combined type: Secondary | ICD-10-CM | POA: Diagnosis not present

## 2023-07-15 ENCOUNTER — Ambulatory Visit: Admitting: Family Medicine

## 2023-07-15 ENCOUNTER — Encounter: Payer: Self-pay | Admitting: Family Medicine

## 2023-07-15 VITALS — BP 104/65 | HR 80 | Ht 68.0 in | Wt 202.0 lb

## 2023-07-15 DIAGNOSIS — L259 Unspecified contact dermatitis, unspecified cause: Secondary | ICD-10-CM

## 2023-07-15 DIAGNOSIS — H6120 Impacted cerumen, unspecified ear: Secondary | ICD-10-CM | POA: Insufficient documentation

## 2023-07-15 DIAGNOSIS — H6123 Impacted cerumen, bilateral: Secondary | ICD-10-CM | POA: Diagnosis not present

## 2023-07-15 MED ORDER — PREDNISONE 10 MG (48) PO TBPK
ORAL_TABLET | Freq: Every day | ORAL | 0 refills | Status: DC
Start: 2023-07-15 — End: 2023-12-29

## 2023-07-15 NOTE — Progress Notes (Addendum)
 Scott Fields - 58 y.o. male MRN [id number]  Date of birth: 12/08/1965  Subjective Chief Complaint  Patient presents with   Ear Fullness   Rash    HPI Scott [name] a 58 y.o. male here today with feeling of clogged ears.  He has had cerumen impaction in the past and this feels similar.  He denies pain associated with this.  He has not had dizziness.  There is no drainage from the ear.  He also has rash on bilateral lower extremities.  Reports he was working to clear some brush over the weekend and rash appeared the following day.  Rash is itchy.  He denies pain associated with this.  ROS:  A comprehensive ROS was completed and negative except as noted per HPI  Allergies  Allergen Reactions   Naproxen Nausea And Vomiting    Past Medical History:  Diagnosis Date   ADHD (attention deficit hyperactivity disorder)    Allergy    Appendicitis 1998   Arthritis    Asthma    Exercise Induced   Fracture of wrist 1984   Fracture, maxillary (HCC) 2009   GERD (gastroesophageal reflux disease)    Hyperlipidemia    Low testosterone  09/15/2017   PONV (postoperative nausea and vomiting)    Prediabetes 09/15/2017   Sleep apnea    Staph infection 2000 and 2002   staph infections post surgeries appe and vasectomy   Thyroid  disease     Past Surgical History:  Procedure Laterality Date   APPENDECTOMY     CHONDROPLASTY Right 12/17/2016   Procedure: CHONDROPLASTY;  Surgeon: Ferd Householder, MD;  Location: [facility];  Service: Orthopedics;  Laterality: Right;   KNEE ARTHROSCOPY Right 12/17/2016   Procedure: KNEE ARTHROSCOPY, REMOVAL OF LOOSE BODIES;  Surgeon: Ferd Householder, MD;  Location: [facility];  Service: Orthopedics;  Laterality: Right;    Social History   Socioeconomic History   Marital status: Married    Spouse name: Not on file   Number of children: Not on file   Years of education: Not on file   Highest education level: Master's degree  (e.g., MA, MS, MEng, MEd, MSW, MBA)  Occupational History   Not on file  Tobacco Use   Smoking status: Never   Smokeless tobacco: Never  Vaping Use   Vaping status: Never Used  Substance and Sexual Activity   Alcohol use: Not Currently    Alcohol/week: 1.0 standard drink of alcohol    Types: 1 Glasses of wine per week    Comment: every several months   Drug use: No   Sexual activity: Yes    Partners: Female  Other Topics Concern   Not on file  Social History Narrative   Not on file   Social Drivers of Health   Financial Resource Strain: Low Risk  (03/31/2023)   Overall Financial Resource Strain (CARDIA)    Difficulty of Paying Living Expenses: Not hard at all  Food Insecurity: No Food Insecurity (03/31/2023)   Hunger Vital Sign    Worried About Running Out of Food in the Last Year: Never true    Ran Out of Food in the Last Year: Never true  Transportation Needs: No Transportation Needs (03/31/2023)   PRAPARE - Administrator, Civil Service (Medical): No    Lack of Transportation (Non-Medical): No  Physical Activity: Insufficiently Active (03/31/2023)   Exercise Vital Sign    Days of Exercise per Week: 4 days  Minutes of Exercise per Session: 30 min  Stress: Stress Concern Present (03/31/2023)   [employer] of Occupational Health - Occupational Stress Questionnaire    Feeling of Stress : To some extent  Social Connections: Socially Integrated (03/31/2023)   Social Connection and Isolation Panel [NHANES]    Frequency of Communication with Friends and Family: Twice a week    Frequency of Social Gatherings with Friends and Family: Once a week    Attends Religious Services: More than 4 times per year    Active Member of Clubs or Organizations: Yes    Attends Engineer, structural: More than 4 times per year    Marital Status: Married    Family History  Problem Relation Age of Onset   Diabetes Mother    Hypertension Mother    Heart attack Father     Emphysema Father    Heart failure Father    Alcohol abuse Maternal Aunt    Alcohol abuse Maternal Uncle    Colon cancer Neg Hx    Esophageal cancer Neg Hx    Rectal cancer Neg Hx    Stomach cancer Neg Hx     Health Maintenance  Topic Date Due   Pneumococcal Vaccine 63-46 Years old (1 of 2 - PCV) Never done   INFLUENZA VACCINE  09/24/2023   FOOT EXAM  12/09/2023   HEMOGLOBIN A1C  12/25/2023   Diabetic kidney evaluation - Urine ACR  03/30/2024   OPHTHALMOLOGY EXAM  04/05/2024   Diabetic kidney evaluation - eGFR measurement  06/23/2024   Colonoscopy  09/29/2028   DTaP/Tdap/Td (4 - Td or Tdap) 02/02/2030   Hepatitis C Screening  Completed   HIV Screening  Completed   Zoster Vaccines- Shingrix   Completed   HPV VACCINES  Aged Out   Meningococcal B Vaccine  Aged Out   COVID-19 Vaccine  Discontinued     ----------------------------------------------------------------------------------------------------------------------------------------------------------------------------------------------------------------- Physical Exam BP 104/65 (BP Location: Left Arm, Patient Position: Sitting, Cuff Size: Normal)   Pulse 80   Ht 5\' 8"  (1.727 m)   Wt 202 lb (91.6 kg)   SpO2 99%   BMI 30.71 kg/m   Physical Exam Constitutional:      Appearance: Normal appearance.  HENT:     Head:     Comments: Bilateral cerumen impaction noted.  After lavage TM is normal b/l.  Some residual wax remains.  Skin:    Comments: Erythematous rash on bilateral lower extremities.  There is no significant warmth or tenderness associated with this.  Neurological:     Mental Status: He is alert.  Psychiatric:        Mood and Affect: Mood normal.        Behavior: Behavior normal.     ------------------------------------------------------------------------------------------------------------------------------------------------------------------------------------------------------------------- Assessment and  Plan  Contact dermatitis Rash consistent with contact dermatitis.  Treating with taper of prednisone .  Red flags of infection reviewed.  He will let me know if not improving or if having worsening symptoms.  Cerumen impaction Bilateral ear lavage completed today.  He tolerated this well.  Recommend use of Debrox to help keep wax soft.   Meds ordered this encounter  Medications   predniSONE  (STERAPRED UNI-PAK 48 TAB) 10 MG (48) TBPK tablet    Sig: Take by mouth daily. 12-day taper pack, use as directed for taper    Dispense:  48 tablet    Refill:  0    No follow-ups on file.

## 2023-07-15 NOTE — Patient Instructions (Signed)
 Contact Dermatitis Dermatitis is when your skin becomes red, sore, and swollen.  Contact dermatitis happens when your body reacts to something that touches the skin. There are 2 types: Irritant contact dermatitis. This is when something bothers your skin, like soap. Allergic contact dermatitis. This is when your skin touches something you are allergic to, like poison ivy. What are the causes? Irritant contact dermatitis may be caused by: Makeup. Soaps. Detergents. Bleaches. Acids. Metals, like nickel. Allergic contact dermatitis may be caused by: Plants. Chemicals. Jewelry. Latex. Medicines. Preservatives. These are things added to products to help them last longer. There may be some in your clothes. What increases the risk? Having a job where you have to be near things that bother your skin. Having asthma or eczema. What are the signs or symptoms?  Dry or flaky skin. Redness. Cracks. Itching. Moderate symptoms of this condition include: Pain or a burning feeling. Blisters. Blood or clear fluid coming from cracks in your skin. Swelling. This may be on your eyelids, mouth, or genitals. How is this treated? Your doctor will find out what is making your skin react. Then, you can protect your skin. You may need to use: Steroid creams, ointments, or medicines. Antibiotics or other ointments, if you have a skin infection. Lotion or medicines to help with itching. A bandage. Follow these instructions at home: Skin care Put moisturizer on your skin when it needs it. Put cool, wet cloths on your skin (cool compresses). Put a baking soda paste on your skin. Stir water into baking soda until it looks like a paste. Do not scratch your skin. Try not to have things rub up against your skin. Avoid tight clothing. Avoid using soaps, perfumes, and dyes. Check your skin every day for signs of infection. Check for: More redness, swelling, or pain. More fluid or blood. Warmth. Pus or  a bad smell. Medicines Take or apply over-the-counter and prescription medicines only as told by your doctor. If you were prescribed antibiotics, take or apply them as told by your doctor. Do not stop using them even if you start to feel better. Bathing Take a bath with: Epsom salts. Baking soda. Colloidal oatmeal. Bathe less often. Bathe in warm water. Try not to use hot water. Bandage care If you were given a bandage, change it as told by your doctor. Wash your hands with soap and water for at least 20 seconds before and after you change your bandage. If you cannot use soap and water, use hand sanitizer. General instructions Avoid the things that caused your reaction. If you don't know what caused it, keep a journal. Write down: What you eat. What skin products you use. What you drink. What you wear. Contact a doctor if: You do not get better with treatment. You get worse. You have signs of infection. You have a fever. You have new symptoms. Your bone or joint near the area hurts after the skin has healed. Get help right away if: You see red streaks coming from the area. The area turns darker. You have trouble breathing. This information is not intended to replace advice given to you by your health care provider. Make sure you discuss any questions you have with your health care provider. Document Revised: 08/15/2021 Document Reviewed: 08/15/2021 Elsevier Patient Education  2024 [employer].

## 2023-07-15 NOTE — Assessment & Plan Note (Signed)
 Bilateral ear lavage completed today.  He tolerated this well.  Recommend use of Debrox to help keep wax soft.

## 2023-07-15 NOTE — Assessment & Plan Note (Signed)
 Rash consistent with contact dermatitis.  Treating with taper of prednisone .  Red flags of infection reviewed.  He will let me know if not improving or if having worsening symptoms.

## 2023-07-21 ENCOUNTER — Other Ambulatory Visit: Payer: Self-pay | Admitting: Family Medicine

## 2023-07-29 DIAGNOSIS — H6122 Impacted cerumen, left ear: Secondary | ICD-10-CM | POA: Diagnosis not present

## 2023-08-03 DIAGNOSIS — G4700 Insomnia, unspecified: Secondary | ICD-10-CM | POA: Diagnosis not present

## 2023-08-03 DIAGNOSIS — F331 Major depressive disorder, recurrent, moderate: Secondary | ICD-10-CM | POA: Diagnosis not present

## 2023-08-03 DIAGNOSIS — F411 Generalized anxiety disorder: Secondary | ICD-10-CM | POA: Diagnosis not present

## 2023-09-21 NOTE — Procedures (Signed)
Result scanned to media

## 2023-09-23 ENCOUNTER — Other Ambulatory Visit: Payer: Self-pay | Admitting: Family Medicine

## 2023-09-23 ENCOUNTER — Encounter: Payer: Self-pay | Admitting: Family Medicine

## 2023-09-23 DIAGNOSIS — G4733 Obstructive sleep apnea (adult) (pediatric): Secondary | ICD-10-CM

## 2023-09-23 MED ORDER — AMBULATORY NON FORMULARY MEDICATION: 0 refills | Status: DC

## 2023-09-27 NOTE — Telephone Encounter (Signed)
 Attempted call to patient. Left a voice mail message requesting a return call.

## 2023-10-05 ENCOUNTER — Encounter: Payer: Self-pay | Admitting: Medical-Surgical

## 2023-10-05 ENCOUNTER — Ambulatory Visit: Payer: Self-pay

## 2023-10-05 ENCOUNTER — Ambulatory Visit (INDEPENDENT_AMBULATORY_CARE_PROVIDER_SITE_OTHER): Admitting: Medical-Surgical

## 2023-10-05 VITALS — BP 103/63 | HR 83 | Resp 20 | Ht 68.0 in | Wt 194.0 lb

## 2023-10-05 DIAGNOSIS — Z789 Other specified health status: Secondary | ICD-10-CM | POA: Diagnosis not present

## 2023-10-05 DIAGNOSIS — N410 Acute prostatitis: Secondary | ICD-10-CM | POA: Diagnosis not present

## 2023-10-05 LAB — POCT URINALYSIS DIP (CLINITEK)
Bilirubin, UA: NEGATIVE
Blood, UA: NEGATIVE
Glucose, UA: NEGATIVE mg/dL
Ketones, POC UA: NEGATIVE mg/dL
Leukocytes, UA: NEGATIVE
Nitrite, UA: NEGATIVE
POC PROTEIN,UA: NEGATIVE
Spec Grav, UA: <=1.005 — AB (ref 1.010–1.025)
Urobilinogen, UA: 0.2 U/dL
pH, UA: 7 (ref 5.0–8.0)

## 2023-10-05 MED ORDER — CIPROFLOXACIN HCL 500 MG PO TABS: 500.0000 mg | ORAL_TABLET | Freq: Two times a day (BID) | ORAL | 0 refills | Status: AC

## 2023-10-05 NOTE — Telephone Encounter (Signed)
 Patient seen today with Scott Fields

## 2023-10-05 NOTE — Telephone Encounter (Signed)
 FYI Only or Action Required?: Action required by provider: request for appointment.  Patient was last seen in primary care on 07/15/2023 by Alvia Bring, DO.  Called Nurse Triage reporting urinary symptoms.  Symptoms began several weeks ago.  Interventions attempted: Nothing.  Symptoms are: gradually worsening. Symptoms x 2 weeks, getting worse.  Triage Disposition: See Physician Within 24 Hours  Patient/caregiver understands and will follow disposition?: Yes   Copied from CRM [id number]. Topic: Clinical - Red Word Triage >> Oct 05, 2023  8:18 AM Alfonso ORN wrote: Red Word that prompted transfer to Nurse Triage: worsen going on for 2 weeks UTI  urgency to uriate and not much coming out ,   patient [phone] Reason for Disposition  Urinating more frequently than usual (i.e., frequency) OR new-onset of the feeling of an urgent need to urinate (i.e., urgency)  Answer Assessment - Initial Assessment Questions 1. SYMPTOM: What's the main symptom you're concerned about? (e.g., frequency, incontinence)     Urgency, frequency, mild pain 2. ONSET: When did the    start?     2 weeks 3. PAIN: Is there any pain? If Yes, ask: How bad is it? (Scale: 1-10; mild, moderate, severe)     mild 4. CAUSE: What do you think is causing the symptoms?     UTI 5. OTHER SYMPTOMS: Do you have any other symptoms? (e.g., blood in urine, fever, flank pain, pain with urination)     NO 6. PREGNANCY: Is there any chance you are pregnant? When was your last menstrual period?     NO  Protocols used: Urinary Symptoms-A-AH

## 2023-10-05 NOTE — Progress Notes (Signed)
        Established patient visit   History of Present Illness   Discussed the use of AI scribe software for clinical note transcription with the patient, who gave verbal consent to proceed.  History of Present Illness   Scott [name] a 58 year old male who presents with urinary urgency and difficulty urinating.  He has experienced increased urinary urgency and difficulty urinating for the past two to three weeks, with a weaker stream and hesitancy. He takes longer to urinate and needs to sit down. There is significant urgency without dribbling after urination. He recalls similar symptoms during a prostate infection in his twenties. There is no burning with urination, dribbling, or pain with bowel movements. He felt feverish briefly after taking Strattera but has no ongoing fever. He has no known allergies to antibiotics.  He recently switched from stimulants to Strattera but discontinued after one dose due to severe side effects, including sweating and difficulty urinating.      Physical Exam   Physical Exam Vitals and nursing note reviewed.  Constitutional:      General: He is not in acute distress.    Appearance: Normal appearance.  HENT:     Head: Normocephalic and atraumatic.  Cardiovascular:     Rate and Rhythm: Normal rate and regular rhythm.     Pulses: Normal pulses.     Heart sounds: Normal heart sounds. No murmur heard.    No friction rub. No gallop.  Pulmonary:     Effort: Pulmonary effort is normal. No respiratory distress.     Breath sounds: Normal breath sounds.  Skin:    General: Skin is warm and dry.  Neurological:     Mental Status: He is alert and oriented to person, place, and time.  Psychiatric:        Mood and Affect: Mood normal.        Behavior: Behavior normal.        Thought Content: Thought content normal.        Judgment: Judgment normal.     Assessment & Plan   Assessment and Plan    Acute bacterial prostatitis with urinary  retention Symptoms and history suggest prostatitis. Urinalysis negative, but urinary symptoms align with prostatitis. Strattera exacerbated symptoms, discontinued due to side effects. - POCT UA completed with negative nitrites, leukocytes, blood, and protein. Spec gravity indicates excellent hydration.  - Despite UA results, plan to send urine for culture. - Prescribe Cipro  500 mg every 12 hours for 2 weeks. - Monitor for leg pain, especially Achilles tendon pain. - Advise caution with gym activities, particularly leg work. - Instruct to contact if symptoms do not improve within 48 hours or worsen during antibiotic course.   Medication intolerance - Strattera added to intolerance list.      Follow up   Return if symptoms worsen or fail to improve.  __________________________________ Zada FREDRIK Palin, DNP, APRN, FNP-BC Primary Care and Sports Medicine [facility]

## 2023-10-07 ENCOUNTER — Ambulatory Visit: Payer: Self-pay | Admitting: Medical-Surgical

## 2023-10-07 LAB — URINE CULTURE: Organism ID, Bacteria: NO GROWTH

## 2023-10-26 ENCOUNTER — Encounter: Payer: Self-pay | Admitting: Family Medicine

## 2023-10-26 ENCOUNTER — Encounter: Payer: Self-pay | Admitting: Sports Medicine

## 2023-10-26 DIAGNOSIS — R3915 Urgency of urination: Secondary | ICD-10-CM

## 2023-11-09 NOTE — Progress Notes (Signed)
 Chief Complaint: Problems with urination  History of Present Illness:  Scott [name] a 58 y.o. male who is seen in consultation from Alvia Bring, DO for evaluation of lower urinary tract symptomatology.  Patient for quite some time has had bothersome issues with urination.  Complete empty feeling, frequent urination, urgency.  Recently had dysuria and was thought to have had a UTI.  Culture and urinalysis were both negative.  Dysuria has improved some but continues mildly.  He had 1 urinary tract infections with a fever in the past 2 to 3 years.  That is the only documented UTI.  He is on testosterone  repletion through an integrative medicine clinic in [place].   Past Medical History:  Past Medical History:  Diagnosis Date   ADHD (attention deficit hyperactivity disorder)    Allergy    Appendicitis 1998   Arthritis    Asthma    Exercise Induced   Fracture of wrist 1984   Fracture, maxillary (HCC) 2009   GERD (gastroesophageal reflux disease)    Hyperlipidemia    Low testosterone  09/15/2017   PONV (postoperative nausea and vomiting)    Prediabetes 09/15/2017   Sleep apnea    Staph infection 2000 and 2002   staph infections post surgeries appe and vasectomy   Thyroid  disease     Past Surgical History:  Past Surgical History:  Procedure Laterality Date   APPENDECTOMY     CHONDROPLASTY Right 12/17/2016   Procedure: CHONDROPLASTY;  Surgeon: Beverley Toribio FALCON, MD;  Location: [facility];  Service: Orthopedics;  Laterality: Right;   KNEE ARTHROSCOPY Right 12/17/2016   Procedure: KNEE ARTHROSCOPY, REMOVAL OF LOOSE BODIES;  Surgeon: Beverley Toribio FALCON, MD;  Location: [facility];  Service: Orthopedics;  Laterality: Right;    Allergies:  Allergies  Allergen Reactions   Naproxen Nausea And Vomiting   Strattera [Atomoxetine] Other (See Comments)    Diaphoresis, urinary retention    Family History:  Family History  Problem Relation Age  of Onset   Diabetes Mother    Hypertension Mother    Heart attack Father    Emphysema Father    Heart failure Father    Alcohol abuse Maternal Aunt    Alcohol abuse Maternal Uncle    Colon cancer Neg Hx    Esophageal cancer Neg Hx    Rectal cancer Neg Hx    Stomach cancer Neg Hx     Social History:  Social History   Tobacco Use   Smoking status: Never   Smokeless tobacco: Never  Vaping Use   Vaping status: Never Used  Substance Use Topics   Alcohol use: Not Currently    Alcohol/week: 1.0 standard drink of alcohol    Types: 1 Glasses of wine per week    Comment: every several months   Drug use: No    Review of symptoms:  Constitutional:  Negative for unexplained weight loss, night sweats, fever, chills ENT:  Negative for nose bleeds, sinus pain, painful swallowing CV:  Negative for chest pain, shortness of breath, exercise intolerance, palpitations, loss of consciousness Resp:  Negative for cough, wheezing, shortness of breath GI:  Negative for nausea, vomiting, diarrhea, bloody stools GU:  Positives noted in HPI; otherwise negative for gross hematuria, dysuria, urinary incontinence Neuro:  Negative for seizures, poor balance, limb weakness, slurred speech Psych:  Negative for lack of energy, depression, anxiety Endocrine:  Negative for polydipsia, polyuria, symptoms of hypoglycemia (dizziness, hunger, sweating) Hematologic:  Negative for anemia,  purpura, petechia, prolonged or excessive bleeding, use of anticoagulants  Allergic:  Negative for difficulty breathing or choking as a result of exposure to anything; no shellfish allergy; no allergic response (rash/itch) to materials, foods  Physical exam: There were no vitals taken for this visit. GENERAL APPEARANCE:  Well appearing, well developed, well nourished, NAD HEENT: Atraumatic, Normocephalic. NECK: Normal appearance LUNGS: Normal inspiratory and expiratory excursion HEART: Regular Rate ABDOMEN: Well-healed lower  midline incision.  No inguinal hernias. GU: Phallus normal, no lesions. Scrotal skin normal. Testicles atrophic bilaterally meatus normal. Normal anal sphincter tone, prostate 30 mL, symmetric, non nodular, non tender. EXTREMITIES: Moves all extremities well.  Without clubbing, cyanosis, or edema. NEUROLOGIC:  Alert and oriented x 3, normal gait, CN II-XII grossly intact.  MENTAL STATUS:  Appropriate. SKIN:  Warm, dry and intact.    Results:  I have reviewed referring/prior physicians notes  I have reviewed urinalysis--clear today  I have reviewed PSA results--0.7 on 5.1.2025 Bladder scan volume 150 mL  IPSS 16/4 Assessment: -Incomplete emptying of bladder-etiology probably BPH or stricture disease  - Low testosterone , on repletion elsewhere   Plan: -He was started on alfuzosin .  Causative factors of incomplete emptying were discussed  - I will have him come back in about 3 weeks to recheck symptoms as well as residual urine volume

## 2023-11-10 ENCOUNTER — Ambulatory Visit: Admitting: Urology

## 2023-11-10 ENCOUNTER — Encounter: Payer: Self-pay | Admitting: Urology

## 2023-11-10 VITALS — BP 143/86 | HR 67 | Ht 68.0 in | Wt 186.0 lb

## 2023-11-10 DIAGNOSIS — R338 Other retention of urine: Secondary | ICD-10-CM

## 2023-11-10 DIAGNOSIS — R339 Retention of urine, unspecified: Secondary | ICD-10-CM

## 2023-11-10 DIAGNOSIS — E291 Testicular hypofunction: Secondary | ICD-10-CM | POA: Diagnosis not present

## 2023-11-10 DIAGNOSIS — R399 Unspecified symptoms and signs involving the genitourinary system: Secondary | ICD-10-CM

## 2023-11-10 LAB — URINALYSIS, ROUTINE W REFLEX MICROSCOPIC
Bilirubin, UA: NEGATIVE
Glucose, UA: NEGATIVE
Ketones, UA: NEGATIVE
Leukocytes,UA: NEGATIVE
Nitrite, UA: NEGATIVE
Protein,UA: NEGATIVE
RBC, UA: NEGATIVE
Specific Gravity, UA: 1.01 (ref 1.005–1.030)
Urobilinogen, Ur: 0.2 mg/dL (ref 0.2–1.0)
pH, UA: 7 (ref 5.0–7.5)

## 2023-11-10 LAB — BLADDER SCAN AMB NON-IMAGING: Scan Result: >450

## 2023-11-10 MED ORDER — ALFUZOSIN HCL ER 10 MG PO TB24: 10.0000 mg | ORAL_TABLET | Freq: Every day | ORAL | 11 refills | Status: AC

## 2023-11-30 NOTE — Progress Notes (Signed)
 Assessment: -Incomplete emptying of bladder-symptoms improved with alfuzosin  although residual urine volume a bit higher today.  Cystoscopy reveals slight enlargement of the prostate with trabeculation of the bladder.  - Low testosterone , on repletion elsewhere   Plan: - For now, he will continue on alfuzosin   - I did recommend that he double void a couple times a day  - I will have him come back in about 3 months to recheck symptoms and residual urine volume History of Present Illness:  9.17.2025: Initially seen for evaluation of lower urinary tract symptomatology.  Patient for quite some time has had bothersome issues with urination.  Complete empty feeling, frequent urination, urgency.  Recently had dysuria and was thought to have had a UTI.  Culture and urinalysis were both negative.  Dysuria has improved some but continues mildly.  He had 1 urinary tract infections with a fever in the past 2 to 3 years.  That is the only documented UTI.  He is on testosterone  repletion through an integrative medicine clinic in [place].  He was started on alfuzosin .  10.8.2025: Here for routine recheck.  He thinks the alfuzosin  has improved flow.   Past Medical History:  Past Medical History:  Diagnosis Date   ADHD (attention deficit hyperactivity disorder)    Allergy    Appendicitis 1998   Arthritis    Asthma    Exercise Induced   Fracture of wrist 1984   Fracture, maxillary (HCC) 2009   GERD (gastroesophageal reflux disease)    Hyperlipidemia    Low testosterone  09/15/2017   PONV (postoperative nausea and vomiting)    Prediabetes 09/15/2017   Sleep apnea    Staph infection 2000 and 2002   staph infections post surgeries appe and vasectomy   Thyroid  disease     Past Surgical History:  Past Surgical History:  Procedure Laterality Date   APPENDECTOMY     CHONDROPLASTY Right 12/17/2016   Procedure: CHONDROPLASTY;  Surgeon: Beverley Toribio FALCON, MD;  Location: [facility];  Service: Orthopedics;  Laterality: Right;   KNEE ARTHROSCOPY Right 12/17/2016   Procedure: KNEE ARTHROSCOPY, REMOVAL OF LOOSE BODIES;  Surgeon: Beverley Toribio FALCON, MD;  Location: [facility];  Service: Orthopedics;  Laterality: Right;    Allergies:  Allergies  Allergen Reactions   Naproxen Nausea And Vomiting   Strattera [Atomoxetine] Other (See Comments)    Diaphoresis, urinary retention    Family History:  Family History  Problem Relation Age of Onset   Diabetes Mother    Hypertension Mother    Heart attack Father    Emphysema Father    Heart failure Father    Alcohol abuse Maternal Aunt    Alcohol abuse Maternal Uncle    Colon cancer Neg Hx    Esophageal cancer Neg Hx    Rectal cancer Neg Hx    Stomach cancer Neg Hx     Social History:  Social History   Tobacco Use   Smoking status: Never   Smokeless tobacco: Never  Vaping Use   Vaping status: Never Used  Substance Use Topics   Alcohol use: Not Currently    Alcohol/week: 1.0 standard drink of alcohol    Types: 1 Glasses of wine per week    Comment: every several months   Drug use: No    Review of symptoms:  Constitutional:  Negative for unexplained weight loss, night sweats, fever, chills ENT:  Negative for nose bleeds, sinus pain, painful swallowing CV:  Negative for  chest pain, shortness of breath, exercise intolerance, palpitations, loss of consciousness Resp:  Negative for cough, wheezing, shortness of breath GI:  Negative for nausea, vomiting, diarrhea, bloody stools GU:  Positives noted in HPI; otherwise negative for gross hematuria, dysuria, urinary incontinence Neuro:  Negative for seizures, poor balance, limb weakness, slurred speech Psych:  Negative for lack of energy, depression, anxiety Endocrine:  Negative for polydipsia, polyuria, symptoms of hypoglycemia (dizziness, hunger, sweating) Hematologic:  Negative for anemia, purpura, petechia, prolonged or excessive  bleeding, use of anticoagulants  Allergic:  Negative for difficulty breathing or choking as a result of exposure to anything; no shellfish allergy; no allergic response (rash/itch) to materials, foods  Physical exam: There were no vitals taken for this visit. GENERAL APPEARANCE:  Well appearing, well developed, well nourished, NAD HEENT: Atraumatic, Normocephalic. NECK: Normal appearance LUNGS: Normal inspiratory and expiratory excursion HEART: Regular Rate ABDOMEN: Well-healed lower midline incision.  No inguinal hernias. GU: Phallus normal, no lesions. Scrotal skin normal. Testicles atrophic bilaterally meatus normal. Normal anal sphincter tone, prostate 30 mL, symmetric, non nodular, non tender. EXTREMITIES: Moves all extremities well.  Without clubbing, cyanosis, or edema. NEUROLOGIC:  Alert and oriented x 3, normal gait, CN II-XII grossly intact.  MENTAL STATUS:  Appropriate. SKIN:  Warm, dry and intact.    Results:  I have reviewed prior note  I have reviewed urinalysis--clear today  I have reviewed PSA results--0.7 on 5.1.2025  Bladder scan volume today was 240 mL (first visit 150)  IPSS 5/2  Cystoscopy Procedure Note:  Indication: Voiding symptoms, increasing urinary residual volume  After informed consent and discussion of the procedure and its risks, Scott Fields was positioned and prepped in the standard fashion.  Cystoscopy was performed with a flexible cystoscope.   Findings: Urethra: No stricture or lesion Prostate: Minimal obstruction.  Relatively short prostatic urethra Bladder neck: Open Ureteral orifices: Normal bilaterally Bladder: 1-2+ trabeculations.  No urothelial lesions, no stones  The patient tolerated the procedure well.

## 2023-12-01 ENCOUNTER — Ambulatory Visit: Admitting: Urology

## 2023-12-01 VITALS — BP 127/83 | HR 74 | Ht 68.0 in | Wt 188.0 lb

## 2023-12-01 DIAGNOSIS — E291 Testicular hypofunction: Secondary | ICD-10-CM

## 2023-12-01 DIAGNOSIS — R339 Retention of urine, unspecified: Secondary | ICD-10-CM

## 2023-12-01 DIAGNOSIS — R39198 Other difficulties with micturition: Secondary | ICD-10-CM | POA: Diagnosis not present

## 2023-12-01 DIAGNOSIS — R399 Unspecified symptoms and signs involving the genitourinary system: Secondary | ICD-10-CM

## 2023-12-01 LAB — BLADDER SCAN AMB NON-IMAGING: Scan Result: 240

## 2023-12-29 ENCOUNTER — Ambulatory Visit: Admitting: Family Medicine

## 2023-12-29 ENCOUNTER — Encounter: Payer: Self-pay | Admitting: Family Medicine

## 2023-12-29 VITALS — BP 105/57 | HR 74 | Ht 68.0 in | Wt 200.0 lb

## 2023-12-29 DIAGNOSIS — E119 Type 2 diabetes mellitus without complications: Secondary | ICD-10-CM | POA: Diagnosis not present

## 2023-12-29 DIAGNOSIS — R339 Retention of urine, unspecified: Secondary | ICD-10-CM | POA: Insufficient documentation

## 2023-12-29 DIAGNOSIS — E039 Hypothyroidism, unspecified: Secondary | ICD-10-CM

## 2023-12-29 LAB — POCT GLYCOSYLATED HEMOGLOBIN (HGB A1C): HbA1c, POC (controlled diabetic range): 5.9 % (ref 0.0–7.0)

## 2023-12-29 NOTE — Assessment & Plan Note (Signed)
 Referral placed to urology.

## 2023-12-29 NOTE — Assessment & Plan Note (Signed)
 Currently off of Mounjaro .  Continues to work on dietary changes.  Blood sugars remain well-controlled.

## 2023-12-29 NOTE — Progress Notes (Signed)
 Scott Fields - 58 y.o. male MRN [id number]  Date of birth: 05-26-1965  Subjective Chief Complaint  Patient presents with   Medical Management of Chronic Issues    Type 2 diabetes mellitus without complication, without long-term current use of insulin  (HCC) Last A1c - 06/24/2023 - 5.9 Last labs - 06/24/2023    HPI  Scott [name] a 58 y.o. male here today for follow up visit.   He reports that he is doing pretty well.SABRA    He is prescribed mounjaro  but has been off of this for a few months.  Recently went on vacation and gained back some of the weight he lost.  A1c is unchanged this time.    Followed by integrative medicine as well.  They are managing his thyroid  and testosterone  supplementation.  He has been followed by urology due to lower urinary tract symptoms and urinary retention.  He is requesting referral to another urologist for a second opinion.  ROS:  A comprehensive ROS was completed and negative except as noted per HPI  Allergies  Allergen Reactions   Naproxen Nausea And Vomiting   Strattera [Atomoxetine] Other (See Comments)    Diaphoresis, urinary retention    Past Medical History:  Diagnosis Date   ADHD (attention deficit hyperactivity disorder)    Allergy    Appendicitis 1998   Arthritis    Asthma    Exercise Induced   Fracture of wrist 1984   Fracture, maxillary (HCC) 2009   GERD (gastroesophageal reflux disease)    Hyperlipidemia    Low testosterone  09/15/2017   PONV (postoperative nausea and vomiting)    Prediabetes 09/15/2017   Sleep apnea    Staph infection 2000 and 2002   staph infections post surgeries appe and vasectomy   Thyroid  disease     Past Surgical History:  Procedure Laterality Date   APPENDECTOMY     CHONDROPLASTY Right 12/17/2016   Procedure: CHONDROPLASTY;  Surgeon: Beverley Toribio FALCON, MD;  Location: [facility];  Service: Orthopedics;  Laterality: Right;   KNEE ARTHROSCOPY Right 12/17/2016   Procedure: KNEE  ARTHROSCOPY, REMOVAL OF LOOSE BODIES;  Surgeon: Beverley Toribio FALCON, MD;  Location: [facility];  Service: Orthopedics;  Laterality: Right;    Social History   Socioeconomic History   Marital status: Married    Spouse name: Not on file   Number of children: Not on file   Years of education: Not on file   Highest education level: Master's degree (e.g., MA, MS, MEng, MEd, MSW, MBA)  Occupational History   Not on file  Tobacco Use   Smoking status: Never   Smokeless tobacco: Never  Vaping Use   Vaping status: Never Used  Substance and Sexual Activity   Alcohol use: Not Currently    Alcohol/week: 1.0 standard drink of alcohol    Types: 1 Glasses of wine per week    Comment: every several months   Drug use: No   Sexual activity: Yes    Partners: Female  Other Topics Concern   Not on file  Social History Narrative   Not on file   Social Drivers of Health   Financial Resource Strain: Low Risk  (03/31/2023)   Overall Financial Resource Strain (CARDIA)    Difficulty of Paying Living Expenses: Not hard at all  Food Insecurity: No Food Insecurity (03/31/2023)   Hunger Vital Sign    Worried About Running Out of Food in the Last Year: Never true    Ran  Out of Food in the Last Year: Never true  Transportation Needs: No Transportation Needs (03/31/2023)   PRAPARE - Administrator, Civil Service (Medical): No    Lack of Transportation (Non-Medical): No  Physical Activity: Insufficiently Active (03/31/2023)   Exercise Vital Sign    Days of Exercise per Week: 4 days    Minutes of Exercise per Session: 30 min  Stress: Stress Concern Present (03/31/2023)   [employer] of Occupational Health - Occupational Stress Questionnaire    Feeling of Stress : To some extent  Social Connections: Socially Integrated (03/31/2023)   Social Connection and Isolation Panel    Frequency of Communication with Friends and Family: Twice a week    Frequency of Social Gatherings with  Friends and Family: Once a week    Attends Religious Services: More than 4 times per year    Active Member of Clubs or Organizations: Yes    Attends Engineer, Structural: More than 4 times per year    Marital Status: Married    Family History  Problem Relation Age of Onset   Diabetes Mother    Hypertension Mother    Heart attack Father    Emphysema Father    Heart failure Father    Alcohol abuse Maternal Aunt    Alcohol abuse Maternal Uncle    Colon cancer Neg Hx    Esophageal cancer Neg Hx    Rectal cancer Neg Hx    Stomach cancer Neg Hx     Health Maintenance  Topic Date Due   Pneumococcal Vaccine: 50+ Years (1 of 2 - PCV) Never done   FOOT EXAM  12/09/2023   Influenza Vaccine  05/23/2024 (Originally 09/24/2023)   Diabetic kidney evaluation - Urine ACR  03/30/2024   OPHTHALMOLOGY EXAM  04/05/2024   Diabetic kidney evaluation - eGFR measurement  06/23/2024   HEMOGLOBIN A1C  06/27/2024   Colonoscopy  09/29/2028   DTaP/Tdap/Td (4 - Td or Tdap) 02/02/2030   Hepatitis B Vaccines 19-59 Average Risk  Completed   Hepatitis C Screening  Completed   HIV Screening  Completed   Zoster Vaccines- Shingrix   Completed   HPV VACCINES  Aged Out   Meningococcal B Vaccine  Aged Out   COVID-19 Vaccine  Discontinued     ----------------------------------------------------------------------------------------------------------------------------------------------------------------------------------------------------------------- Physical Exam BP (!) 105/57 (BP Location: Left Arm, Patient Position: Sitting)   Pulse 74   Ht 5' 8 (1.727 m)   Wt 200 lb (90.7 kg)   SpO2 98%   BMI 30.41 kg/m   Physical Exam Constitutional:      Appearance: Normal appearance.  HENT:     Head: Normocephalic and atraumatic.  Eyes:     General: No scleral icterus. Cardiovascular:     Rate and Rhythm: Normal rate and regular rhythm.  Pulmonary:     Effort: Pulmonary effort is normal.     Breath  sounds: Normal breath sounds.  Neurological:     Mental Status: He is alert.  Psychiatric:        Mood and Affect: Mood normal.        Behavior: Behavior normal.     ------------------------------------------------------------------------------------------------------------------------------------------------------------------------------------------------------------------- Assessment and Plan  Type 2 diabetes mellitus without complications (HCC) Currently off of Mounjaro .  Continues to work on dietary changes.  Blood sugars remain well-controlled.   Adult hypothyroidism He is seeing [facility].  His NP thyroid  was recently increased  Urinary retention Referral placed to urology.   No orders of the defined types  were placed in this encounter.   Return in about 6 months (around 06/27/2024) for Type 2 Diabetes, Hypertension.

## 2023-12-29 NOTE — Assessment & Plan Note (Signed)
He is seeing [facility].  His NP thyroid was recently increased

## 2024-01-18 ENCOUNTER — Telehealth: Payer: Self-pay

## 2024-01-18 ENCOUNTER — Ambulatory Visit
Admission: EM | Admit: 2024-01-18 | Discharge: 2024-01-18 | Disposition: A | Discharge status: Home / Self Care | Attending: Family Medicine | Admitting: Family Medicine

## 2024-01-18 ENCOUNTER — Telehealth: Payer: Self-pay | Admitting: Emergency Medicine

## 2024-01-18 ENCOUNTER — Other Ambulatory Visit: Payer: Self-pay

## 2024-01-18 ENCOUNTER — Telehealth: Payer: Self-pay | Admitting: Family Medicine

## 2024-01-18 DIAGNOSIS — L02214 Cutaneous abscess of groin: Secondary | ICD-10-CM | POA: Diagnosis not present

## 2024-01-18 LAB — CBC WITH DIFFERENTIAL/PLATELET
Basophils Absolute: 0 x10E3/uL (ref 0.0–0.2)
Basos: 0 %
EOS (ABSOLUTE): 0.1 x10E3/uL (ref 0.0–0.4)
Eos: 1 %
Hematocrit: 45.9 % (ref 37.5–51.0)
Hemoglobin: 15.3 g/dL (ref 13.0–17.7)
Immature Granulocytes: 1 %
Lymphocytes Absolute: 1.6 x10E3/uL (ref 0.7–3.1)
Lymphs: 17 %
MCH: 30.1 pg (ref 26.6–33.0)
MCHC: 33.3 g/dL (ref 31.5–35.7)
MCV: 90 fL (ref 79–97)
Monocytes Absolute: 0.8 x10E3/uL (ref 0.1–0.9)
Monocytes: 8 %
Neutrophils Absolute: 6.9 x10E3/uL (ref 1.4–7.0)
Neutrophils: 73 %
Platelets: 210 x10E3/uL (ref 150–450)
RBC: 5.08 x10E6/uL (ref 4.14–5.80)
RDW: 12.5 % (ref 11.6–15.4)
WBC: 9.4 x10E3/uL (ref 3.4–10.8)

## 2024-01-18 MED ORDER — CEFTRIAXONE SODIUM 1 G IJ SOLR
1000.0000 mg | Freq: Once | INTRAMUSCULAR | Status: AC
  Administered 2024-01-18: 1000 mg via INTRAMUSCULAR

## 2024-01-18 MED ORDER — DOXYCYCLINE HYCLATE 100 MG PO CAPS: 100.0000 mg | ORAL_CAPSULE | Freq: Two times a day (BID) | ORAL | 0 refills | Status: AC

## 2024-01-18 NOTE — ED Provider Notes (Signed)
 [facility]    CSN: [id number] Arrival date & time: 01/18/24  0810      History   Chief Complaint No chief complaint on file.   HPI Scott [name] a 58 y.o. male.   HPI pleasant 58 year old male presents with abscess of right groin for 2 days.  Patient reports ruptured and draining since this morning.  PMH significant for ADHD, thyroid  disease, and T2DM without complications.  Past Medical History:  Diagnosis Date   ADHD (attention deficit hyperactivity disorder)    Allergy    Appendicitis 1998   Arthritis    Asthma    Exercise Induced   Fracture of wrist 1984   Fracture, maxillary (HCC) 2009   GERD (gastroesophageal reflux disease)    Hyperlipidemia    Low testosterone  09/15/2017   PONV (postoperative nausea and vomiting)    Prediabetes 09/15/2017   Sleep apnea    Staph infection 2000 and 2002   staph infections post surgeries appe and vasectomy   Thyroid  disease     Patient Active Problem List   Diagnosis Date Noted   Urinary retention 12/29/2023   Cerumen impaction 07/15/2023   Fracture right fifth proximal phalangeal lateral base 05/25/2023   Constipation 05/18/2023   Type 2 diabetes mellitus without complications (HCC) 07/13/2022   Prostatitis 07/06/2022   Contact dermatitis 12/03/2020   GAD (generalized anxiety disorder) 04/22/2020   Low testosterone  09/15/2017   Chondromalacia, patella, left 10/07/2016   Seborrheic dermatitis 12/16/2015   Headache, migraine 08/06/2015   Exercise-induced asthma 08/06/2015   Attention deficit hyperactivity disorder 07/02/2011   HLD (hyperlipidemia) 02/20/2011   Avitaminosis D 02/20/2011   OSA (obstructive sleep apnea) 12/10/2010   Adult hypothyroidism 10/29/2010    Past Surgical History:  Procedure Laterality Date   APPENDECTOMY     CHONDROPLASTY Right 12/17/2016   Procedure: CHONDROPLASTY;  Surgeon: Beverley Toribio FALCON, MD;  Location: [facility];  Service: Orthopedics;  Laterality: Right;    KNEE ARTHROSCOPY Right 12/17/2016   Procedure: KNEE ARTHROSCOPY, REMOVAL OF LOOSE BODIES;  Surgeon: Beverley Toribio FALCON, MD;  Location: [facility];  Service: Orthopedics;  Laterality: Right;       Home Medications    Prior to Admission medications   Medication Sig Start Date End Date Taking? Authorizing Provider  albuterol  (PROAIR  HFA) 108 (90 Base) MCG/ACT inhaler Inhale 2 puffs into the lungs every 6 (six) hours as needed for wheezing or shortness of breath. 07/13/22   Alvia Bring, DO  alfuzosin  (UROXATRAL ) 10 MG 24 hr tablet Take 1 tablet (10 mg total) by mouth daily with breakfast. 11/10/23   Matilda Senior, MD  AMBULATORY NON FORMULARY MEDICATION Adjust continuous positive airway pressure (CPAP) machine to following settings  auto-titrate from 8-20 cm of H2O pressure. Mask per patient preference. Please send compliance data at 90 days. 09/23/23   Alvia Bring, DO  cholecalciferol (VITAMIN D ) 1000 units tablet Take 4,000 Units by mouth daily.    [provider]  dexmethylphenidate  (FOCALIN  XR) 15 MG 24 hr capsule Take 1 capsule (15 mg total) by mouth daily. 12/09/22   Alvia Bring, DO  diazepam  (VALIUM ) 10 MG tablet TAKE 1 TABLET BY MOUTH AT BEDTIME AS NEEDED FOR ANXIETY 12/16/21   Alvia Bring, DO  NP THYROID  60 MG tablet Take 180 mg by mouth daily before breakfast. 03/28/20   [provider]  Omega-3 Fatty Acids (FISH OIL) 1000 MG CAPS Take 1,000 mg by mouth.    [provider]  Rimegepant Sulfate (NURTEC) 75 MG TBDP Take 1 tab daily as needed for migraine. 12/16/21   Alvia Bring, DO  testosterone  cypionate (DEPOTESTOSTERONE CYPIONATE) 200 MG/ML injection INJECT 0.4 MLS WEEKLY AS DIRECTED 05/20/18   [provider]    Family History Family History  Problem Relation Age of Onset   Diabetes Mother    Hypertension Mother    Heart attack Father    Emphysema Father    Heart failure Father    Alcohol abuse Maternal Aunt     Alcohol abuse Maternal Uncle    Colon cancer Neg Hx    Esophageal cancer Neg Hx    Rectal cancer Neg Hx    Stomach cancer Neg Hx     Social History Social History   Tobacco Use   Smoking status: Never   Smokeless tobacco: Never  Vaping Use   Vaping status: Never Used  Substance Use Topics   Alcohol use: Not Currently    Alcohol/week: 1.0 standard drink of alcohol    Types: 1 Glasses of wine per week    Comment: every several months   Drug use: No     Allergies   Naproxen and Strattera [atomoxetine]   Review of Systems Review of Systems  Skin:  Positive for wound.       Abscess of right groin for 2 days     Physical Exam Triage Vital Signs ED Triage Vitals  Encounter Vitals Group     BP 01/18/24 0823 132/82     Girls Systolic BP Percentile --      Girls Diastolic BP Percentile --      Boys Systolic BP Percentile --      Boys Diastolic BP Percentile --      Pulse Rate 01/18/24 0823 70     Resp 01/18/24 0823 16     Temp 01/18/24 0823 98 F (36.7 C)     Temp src --      SpO2 01/18/24 0823 97 %     Weight --      Height --      Head Circumference --      Peak Flow --      Pain Score 01/18/24 0826 3     Pain Loc --      Pain Education --      Exclude from Growth Chart --    No data found.  Updated Vital Signs BP 132/82   Pulse 70   Temp 98 F (36.7 C)   Resp 16   SpO2 97%   Physical Exam Vitals and nursing note reviewed.  Constitutional:      Appearance: Normal appearance. He is normal weight.  HENT:     Head: Normocephalic and atraumatic.     Mouth/Throat:     Mouth: Mucous membranes are moist.     Pharynx: Oropharynx is clear.  Eyes:     Extraocular Movements: Extraocular movements intact.     Conjunctiva/sclera: Conjunctivae normal.     Pupils: Pupils are equal, round, and reactive to light.  Cardiovascular:     Rate and Rhythm: Normal rate and regular rhythm.     Heart sounds: Normal heart sounds.  Pulmonary:     Effort: Pulmonary  effort is normal.     Breath sounds: Normal breath sounds. No wheezing, rhonchi or rales.  Musculoskeletal:        General: Normal range of motion.  Skin:    General: Skin is warm and dry.  Comments: Right superior thigh (inguinal crease):~9.5 cm x 4.0 cm erythematous, draining abscess noted please see image below  Neurological:     General: No focal deficit present.     Mental Status: He is alert and oriented to person, place, and time. Mental status is at baseline.  Psychiatric:        Mood and Affect: Mood normal.        Behavior: Behavior normal.      UC Treatments / Results  Labs (all labs ordered are listed, but only abnormal results are displayed) Labs Reviewed  CBC WITH DIFFERENTIAL/PLATELET    EKG   Radiology No results found.  Procedures Procedures (including critical care time)  Medications Ordered in UC Medications  cefTRIAXone  (ROCEPHIN ) injection 1,000 mg (has no administration in time range)    Initial Impression / Assessment and Plan / UC Course  I have reviewed the triage vital signs and the nursing notes.  Pertinent labs & imaging results that were available during my care of the patient were reviewed by me and considered in my medical decision making (see chart for details).     MDM: 1.  Abscess of groin, right-IM Rocephin  1 g given once in clinic, Rx'd doxycycline  100 mg capsule: Take 1 capsule twice daily x 10 days, stat CBC with differential ordered. Advised/encouraged patient to wear loose fitting cotton briefs or the completely naked and allow air to infected abscess of groin.  Advised patient take medication as directed with food to completion.  Encouraged to increase daily water intake to 64 ounces per day while taking this medication.  Advised we will follow-up with lab results today (2-3 PM) advised if symptoms worsen and/or unresolved please follow-up with your PCP or here for further evaluation.  Patient discharged home, hemodynamically  stable. Final Clinical Impressions(s) / UC Diagnoses   Final diagnoses:  Abscess of groin, right     Discharge Instructions      Advised/encouraged patient to wear loose fitting cotton briefs or the completely naked and allow air to infected abscess of groin.  Advised patient take medication as directed with food to completion.  Encouraged to increase daily water intake to 64 ounces per day while taking this medication.  Advised we will follow-up with lab results today (2-3 PM) advised if symptoms worsen and/or unresolved please follow-up with your PCP or here for further evaluation.     ED Prescriptions   None    PDMP not reviewed this encounter.   Teddy Sharper, FNP 01/18/24 0930

## 2024-01-18 NOTE — ED Notes (Signed)
 Stat lab collected and called in to [facility]. Confirmation # [id number]

## 2024-01-18 NOTE — Telephone Encounter (Signed)
 Doxycycline  sent to pharmacy per patient request.

## 2024-01-18 NOTE — Telephone Encounter (Signed)
 Pt called regarding po ABT. Provider notified and called prescription in to pt's requested pharmacy.

## 2024-01-18 NOTE — ED Notes (Signed)
 Venipuncture x 1 by Jinnie Gails RN. Sent for lab stat pick up.

## 2024-01-18 NOTE — ED Triage Notes (Signed)
 Has c/o abscess to right groin since Sunday. Has pain. Reports it ruptured this morning and may not need to be drained. No documented fever but reports he has had chills and hot flashes. Has had ibuprofen .

## 2024-01-18 NOTE — Telephone Encounter (Signed)
 Stat CBC resulted and WBC is elevated at this time.  I personally called and spoke with patient regarding this and recommended continuing with previously prescribed antibiotic and returning here if needed.

## 2024-01-18 NOTE — Discharge Instructions (Addendum)
 Advised/encouraged patient to wear loose fitting cotton briefs or the completely naked and allow air to infected abscess of groin.  Advised patient take medication as directed with food to completion.  Encouraged to increase daily water intake to 64 ounces per day while taking this medication.  Advised we will follow-up with lab results today (2-3 PM) advised if symptoms worsen and/or unresolved please follow-up with your PCP or here for further evaluation.

## 2024-02-06 ENCOUNTER — Encounter: Payer: Self-pay | Admitting: Family Medicine

## 2024-02-07 ENCOUNTER — Other Ambulatory Visit: Payer: Self-pay | Admitting: Family Medicine

## 2024-02-07 DIAGNOSIS — G4733 Obstructive sleep apnea (adult) (pediatric): Secondary | ICD-10-CM

## 2024-02-07 MED ORDER — AMBULATORY NON FORMULARY MEDICATION: 0 refills | Status: AC

## 2024-06-27 ENCOUNTER — Ambulatory Visit: Admitting: Family Medicine
